# Patient Record
Sex: Male | Born: 1955 | Race: White | Hispanic: No | Marital: Married | Smoking: Former smoker
Health system: Southern US, Community
[De-identification: ages and names within clinical notes are randomized; demographics above are authoritative.]

## PROBLEM LIST (undated history)

## (undated) DIAGNOSIS — F419 Anxiety disorder, unspecified: Secondary | ICD-10-CM

## (undated) DIAGNOSIS — R519 Headache, unspecified: Secondary | ICD-10-CM

## (undated) DIAGNOSIS — I209 Angina pectoris, unspecified: Secondary | ICD-10-CM

## (undated) DIAGNOSIS — R51 Headache: Secondary | ICD-10-CM

## (undated) DIAGNOSIS — I2510 Atherosclerotic heart disease of native coronary artery without angina pectoris: Secondary | ICD-10-CM

## (undated) DIAGNOSIS — R112 Nausea with vomiting, unspecified: Secondary | ICD-10-CM

## (undated) DIAGNOSIS — E119 Type 2 diabetes mellitus without complications: Secondary | ICD-10-CM

## (undated) DIAGNOSIS — E1340 Other specified diabetes mellitus with diabetic neuropathy, unspecified: Secondary | ICD-10-CM

## (undated) DIAGNOSIS — Z9289 Personal history of other medical treatment: Secondary | ICD-10-CM

## (undated) DIAGNOSIS — E7800 Pure hypercholesterolemia, unspecified: Secondary | ICD-10-CM

## (undated) DIAGNOSIS — I10 Essential (primary) hypertension: Secondary | ICD-10-CM

## (undated) DIAGNOSIS — Z9889 Other specified postprocedural states: Secondary | ICD-10-CM

## (undated) DIAGNOSIS — M1990 Unspecified osteoarthritis, unspecified site: Secondary | ICD-10-CM

## (undated) DIAGNOSIS — Z9641 Presence of insulin pump (external) (internal): Secondary | ICD-10-CM

## (undated) HISTORY — PX: CERVICAL FUSION: SHX112

## (undated) HISTORY — PX: CARDIAC CATHETERIZATION: SHX172

## (undated) HISTORY — PX: APPENDECTOMY: SHX54

## (undated) HISTORY — DX: Personal history of other medical treatment: Z92.89

## (undated) HISTORY — DX: Atherosclerotic heart disease of native coronary artery without angina pectoris: I25.10

---

## 1986-08-05 HISTORY — PX: FRACTURE SURGERY: SHX138

## 2005-05-14 ENCOUNTER — Inpatient Hospital Stay: Payer: Self-pay | Admitting: Internal Medicine

## 2005-05-14 ENCOUNTER — Other Ambulatory Visit: Payer: Self-pay

## 2005-05-29 ENCOUNTER — Ambulatory Visit (HOSPITAL_COMMUNITY): Admission: RE | Admit: 2005-05-29 | Discharge: 2005-05-29 | Payer: Self-pay | Admitting: Neurosurgery

## 2005-06-05 ENCOUNTER — Inpatient Hospital Stay (HOSPITAL_COMMUNITY): Admission: RE | Admit: 2005-06-05 | Discharge: 2005-06-08 | Payer: Self-pay | Admitting: Neurosurgery

## 2006-09-22 ENCOUNTER — Emergency Department (HOSPITAL_COMMUNITY): Admission: EM | Admit: 2006-09-22 | Discharge: 2006-09-22 | Payer: Self-pay | Admitting: Emergency Medicine

## 2006-11-19 ENCOUNTER — Ambulatory Visit: Payer: Self-pay | Admitting: Gastroenterology

## 2007-03-12 ENCOUNTER — Ambulatory Visit: Admission: RE | Admit: 2007-03-12 | Discharge: 2007-03-12 | Payer: Self-pay | Admitting: Neurosurgery

## 2010-04-11 ENCOUNTER — Ambulatory Visit: Payer: Self-pay | Admitting: Family Medicine

## 2010-05-01 ENCOUNTER — Ambulatory Visit (HOSPITAL_COMMUNITY): Admission: RE | Admit: 2010-05-01 | Discharge: 2010-05-01 | Payer: Self-pay | Admitting: Neurosurgery

## 2010-05-04 ENCOUNTER — Inpatient Hospital Stay (HOSPITAL_COMMUNITY): Admission: RE | Admit: 2010-05-04 | Discharge: 2010-05-06 | Payer: Self-pay | Admitting: Neurosurgery

## 2010-09-24 ENCOUNTER — Emergency Department: Payer: Self-pay | Admitting: Emergency Medicine

## 2011-01-06 ENCOUNTER — Encounter: Payer: Self-pay | Admitting: Neurosurgery

## 2011-03-04 LAB — GLUCOSE, CAPILLARY
Glucose-Capillary: 127 mg/dL — ABNORMAL HIGH (ref 70–99)
Glucose-Capillary: 127 mg/dL — ABNORMAL HIGH (ref 70–99)
Glucose-Capillary: 143 mg/dL — ABNORMAL HIGH (ref 70–99)
Glucose-Capillary: 156 mg/dL — ABNORMAL HIGH (ref 70–99)
Glucose-Capillary: 79 mg/dL (ref 70–99)

## 2011-03-04 LAB — PROTIME-INR
INR: 1.03 (ref 0.00–1.49)
Prothrombin Time: 13.4 seconds (ref 11.6–15.2)

## 2011-03-04 LAB — DIFFERENTIAL
Eosinophils Relative: 2 % (ref 0–5)
Lymphocytes Relative: 26 % (ref 12–46)
Lymphs Abs: 2.4 10*3/uL (ref 0.7–4.0)

## 2011-03-04 LAB — COMPREHENSIVE METABOLIC PANEL
AST: 17 U/L (ref 0–37)
CO2: 24 mEq/L (ref 19–32)
Calcium: 9 mg/dL (ref 8.4–10.5)
Creatinine, Ser: 0.85 mg/dL (ref 0.4–1.5)
GFR calc Af Amer: >60 mL/min (ref >60)
GFR calc non Af Amer: >60 mL/min (ref >60)

## 2011-03-04 LAB — URINALYSIS, ROUTINE W REFLEX MICROSCOPIC
Leukocytes, UA: NEGATIVE
Nitrite: NEGATIVE
Protein, ur: NEGATIVE mg/dL
Urobilinogen, UA: 1 mg/dL (ref 0.0–1.0)

## 2011-03-04 LAB — CBC
MCHC: 33.9 g/dL (ref 30.0–36.0)
MCV: 94.1 fL (ref 78.0–100.0)
Platelets: 186 10*3/uL (ref 150–400)
RBC: 4.74 MIL/uL (ref 4.22–5.81)

## 2011-03-04 LAB — URINE MICROSCOPIC-ADD ON

## 2011-03-04 LAB — APTT: aPTT: 28 seconds (ref 24–37)

## 2011-03-05 LAB — COMPREHENSIVE METABOLIC PANEL
AST: 16 U/L (ref 0–37)
Albumin: 4.1 g/dL (ref 3.5–5.2)
Alkaline Phosphatase: 96 U/L (ref 39–117)
BUN: 11 mg/dL (ref 6–23)
CO2: 24 mEq/L (ref 19–32)
Chloride: 103 mEq/L (ref 96–112)
Creatinine, Ser: 0.78 mg/dL (ref 0.4–1.5)
GFR calc Af Amer: >60 mL/min (ref >60)
GFR calc non Af Amer: >60 mL/min (ref >60)
Potassium: 4.2 mEq/L (ref 3.5–5.1)
Total Bilirubin: 0.8 mg/dL (ref 0.3–1.2)

## 2011-03-05 LAB — URINALYSIS, ROUTINE W REFLEX MICROSCOPIC
Bilirubin Urine: NEGATIVE
Glucose, UA: 250 mg/dL — AB
Hgb urine dipstick: NEGATIVE
Specific Gravity, Urine: 1.006 (ref 1.005–1.030)
pH: 6.5 (ref 5.0–8.0)

## 2011-03-05 LAB — CBC
HCT: 44.8 % (ref 39.0–52.0)
MCV: 94.2 fL (ref 78.0–100.0)
RBC: 4.76 MIL/uL (ref 4.22–5.81)
WBC: 7.5 10*3/uL (ref 4.0–10.5)

## 2011-03-05 LAB — DIFFERENTIAL
Basophils Absolute: 0 10*3/uL (ref 0.0–0.1)
Basophils Relative: 1 % (ref 0–1)
Eosinophils Relative: 1 % (ref 0–5)
Lymphocytes Relative: 32 % (ref 12–46)
Monocytes Absolute: 0.6 10*3/uL (ref 0.1–1.0)

## 2011-03-05 LAB — SURGICAL PCR SCREEN: Staphylococcus aureus: NEGATIVE

## 2011-03-05 LAB — PROTIME-INR: Prothrombin Time: 12.3 seconds (ref 11.6–15.2)

## 2011-05-03 NOTE — Op Note (Signed)
NAMELAMARIUS, DIRR NO.:  [id number]   MEDICAL RECORD NO.:  [id number]          PATIENT TYPE:  INP   LOCATION:  2899                         FACILITY:  [facility]   PHYSICIAN:  Payton Doughty, M.D.      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   OPERATION PERFORMED:  Exploration of C6-7 and C7-T1 fusion and anterior  cervical decompression and fusion at C5-6 with Reflex Hybrid plate.   SURGEON:  Payton Doughty, M.D.   ANESTHESIA:  General endotracheal.   PREP:  Sterile Betadine prep and scrub with alcohol wipe.   COMPLICATIONS:  None.   ASSISTANT:  Hewitt Shorts, M.D.   INDICATIONS FOR PROCEDURE:  The patient is a 55 year old gentleman who had a  prior fusion at another institution at 6-7 and 7-1 and now has questionable  nonunion at those levels as well as ruptured disk at C5 and C6.   DESCRIPTION OF PROCEDURE:  The patient was taken to the operating room,  smoothly anesthetized, intubated, and placed supine on the operating table.  Following shave, prep and drape in the usual sterile fashion, the skin was  incised starting just at the clavicle, paralleling the medial border of the  sternocleidomastoid muscle for a distance of approximately 6 cm.  The  platysma was identified, elevated, divided and undermined.  Sternocleidomastoid was identified.  Medial dissection revealed the omohyoid  and the carotid artery.  Dissection along the superior border of the  omohyoid liberated fascial planes through which the carotid could be tracked  laterally to the left.  Trachea and esophagus could be retracted laterally  to the right.  The plate was identified and the tissue over it sharply  opened  while carefully protecting the esophagus and the tissue was elevated  off of the plate to its inferior margin and slightly up over the C5-6 disk  space.  The plate was carefully dissected free, the screws were removed and  the plate removed.  Careful inspection under direct vision of the fusion  underneath it revealed that it was very solidly in place with no evidence of  nonunion.  Attention was then turned to the C5-6 space and the 5-6 disk was  removed, first under gross observation, the operating microscope was then  brought in and we used microdissection technique to remove the remaining  disk, dissecting the posterior longitudinal ligament and exploring the  neural foramina.  On the left side there was a very large herniated disk  with compression of the left C6 root and it was removed without difficulty.  Having complete decompression, an 8 mm bone graft was fashioned from  patellar allograft and tapped into place and a 16 mm tether plate and 12 mm  screws were used, two in C5, two in C6.  An intraoperative x-ray showed good  placement of bone graft, plate and screws.  The esophagus was carefully  inspected.  No defects being found, the drain was placed in the prevertebral  space and exited via separate stab wound on the left side.  Successive  layers of 3-0 Vicryl and 4-0 Vicryl were used to close the platysma,  subcutaneous tissue and skin respectively.  Benzoin and Steri-Strips were  placed and made occlusive with Telfa and OpSite.  The patient was then  transferred to the recovery room in good condition after being placed in an  Aspen collar.       MWR/MEDQ  D:  06/05/2005  T:  06/05/2005  Job:  [id number]

## 2011-05-03 NOTE — H&P (Signed)
Martin Deleon, Martin Deleon              ACCOUNT NO.:  [id number]   MEDICAL RECORD NO.:  [id number]          PATIENT TYPE:  INP   LOCATION:  NA                           FACILITY:  [facility]   PHYSICIAN:  Payton Doughty, M.D.      DATE OF BIRTH:  1956/11/27   DATE OF ADMISSION:  06/05/2005  DATE OF DISCHARGE:                                HISTORY & PHYSICAL   7   ADMITTING DIAGNOSES:  1.  Herniated disk at C5-6.  2.  Questionable nonunion at C6-7 and C7-T1.   SERVICE:  Neurosurgery.   HISTORY OF PRESENT ILLNESS:  This is a 55 year old right-handed white  gentleman who had an anterior decompression and fusion at C6-7 and C7-T1 in  [place] in June of '05.  His arm got better and he developed some right neck  pain, but he developed pain down his left shoulder and down into his left  arm.  He had neck pain associated with it and MR demonstrated a large  herniated disk eccentric to the left side at C5-C6.  The films also show  possible nonunion at C6-7 and C7-T1 and he is admitted now for exploration  of his old fusion, diskectomy at 5-6 and fusion either at C5-6 or from C5 to  T1.   PAST MEDICAL HISTORY:  His medical history is remarkable for insulin-  dependent diabetic.   MEDICATIONS:  1.  Neurontin 800 mg four times a day.  2.  Zestril 20 mg a day.  3.  Lodine 500 mg twice a day.  4.  Klonopin 1 mg four times a day.  5.  Mevacor 40 mg a day.  6.  Promethazine 25 mg a day.  7.  Ultram 325 mg four times a day.  8.  Verapamil 240 mg once a day.  9.  Tizanidine 2 mg three times a day.  10. Viagra on a p.r.n. basis.  11. He has an insulin pump.   ALLERGIES:  He is sensitive to CODEINE.   SURGICAL HISTORY:  1.  Neck fusion in June of '05.  2.  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  3.  Right elbow fracture in '88.   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   FAMILY HISTORY:  Mom is in good health; daddy is deceased, history not   given.   REVIEW OF SYSTEMS:  Review of systems is remarkable for vertigo, chest pain,  hypertension, hypercholesterolemia, swelling in his hands from disuse,  nausea, abdominal pain, joint pain, neck pain all the time, fainting spells,  difficulty with concentration, anxiety and diabetes.   PHYSICAL EXAMINATION:  HEENT:  Exam is within normal limits.  NECK:  He has limited range of motion of his neck secondary to pain.  CHEST:  Clear.  CARDIAC:  Regular rate and rhythm.  ABDOMEN:  Abdomen is nontender with no hepatosplenomegaly.  EXTREMITIES:  Extremities without clubbing or cyanosis.  GU:  Exam is deferred.  PERIPHERAL VASCULAR:  Peripheral pulses are good.  NEUROLOGIC:  He is awake, alert and oriented.  His cranial nerves are  intact.  Motor exam shows 5/5 strength throughout the upper extremities,  save for the left deltoid, which is 5-/5 with pain limitation.  Sensory  dysesthesia is described in the left C5 distribution.  Reflexes are 2 at the  right biceps, flicker at the left, 1 at the triceps bilaterally and 1 at the  brachioradialis bilaterally.  Hoffmann's is negative.   IMAGING STUDIES:  MR demonstrates a large disk at C5-C6.   The x-rays have been reviewed above and note that there is a question of  union at 6-7 and 7-1.   CLINICAL IMPRESSION:  Left C6 radiculopathy secondary to disk.   PLAN:  The plan is for an anterior decompression at C5-6, exploration and  fusion at C6-7 and C7-T1.  If necessary, we will plate from 5 to 1, if not,  if the fusion is alright, we will simply proceed at C5-C6.  The risks and  benefits of this approach have been discussed with him and he wishes to  proceed.       MWR/MEDQ  D:  06/05/2005  T:  06/05/2005  Job:  [id number]

## 2011-05-03 NOTE — Discharge Summary (Signed)
NAMECHOYA, TORNOW              ACCOUNT NO.:  [id number]   MEDICAL RECORD NO.:  [id number]          PATIENT TYPE:  INP   LOCATION:  3038                         FACILITY:  [facility]   PHYSICIAN:  Coletta Memos, M.D.     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   PROCEDURE:  Exploration of C6-7, C7-T1 fusion, anterior cervical  decompression and fusion of C5-6 with Reflex Hybrid plate.   HOSPITAL COURSE:  The patient was admitted to the hospital on June 21 and  underwent an uncomplicated procedure.  The fusion was found to be solid at  C6-7 and at C7-T1 and therefore the plate was removed.  He had a new plate  placed at C5-6 and underwent decompression and arthrodesis.  Postoperatively, he has been doing quite well, no problems with swallowing.  He is eating well, he is walking well.  He has a wound which is clean and  dry and without signs of infection.  Again, he is doing quite well.  He will  be discharged home.       KC/MEDQ  D:  06/08/2005  T:  06/09/2005  Job:  [id number]

## 2012-02-18 ENCOUNTER — Observation Stay: Payer: Self-pay | Admitting: Internal Medicine

## 2012-02-18 LAB — URINALYSIS, COMPLETE
Bacteria: NONE SEEN
Bilirubin,UR: NEGATIVE
RBC,UR: <1 /HPF (ref 0–5)
Squamous Epithelial: <1
WBC UR: 3 /HPF (ref 0–5)

## 2012-02-18 LAB — BASIC METABOLIC PANEL
BUN: 15 mg/dL (ref 7–18)
Chloride: 99 mmol/L (ref 98–107)
Co2: 25 mmol/L (ref 21–32)
Creatinine: 0.93 mg/dL (ref 0.60–1.30)
EGFR (African American): >60
Potassium: 4.3 mmol/L (ref 3.5–5.1)
Sodium: 134 mmol/L — ABNORMAL LOW (ref 136–145)

## 2012-02-18 LAB — CK TOTAL AND CKMB (NOT AT ARMC)
CK, Total: 132 U/L (ref 35–232)
CK-MB: 0.6 ng/mL (ref 0.5–3.6)

## 2012-02-18 LAB — CBC
HCT: 44.7 % (ref 40.0–52.0)
HGB: 14.6 g/dL (ref 13.0–18.0)
MCH: 31 pg (ref 26.0–34.0)
MCHC: 32.7 g/dL (ref 32.0–36.0)
RDW: 12.5 % (ref 11.5–14.5)

## 2012-02-18 LAB — TROPONIN I: Troponin-I: <0.02 ng/mL

## 2012-02-18 LAB — MAGNESIUM: Magnesium: 1.7 mg/dL — ABNORMAL LOW

## 2012-02-19 LAB — HEMOGLOBIN A1C: Hemoglobin A1C: 9.7 % — ABNORMAL HIGH (ref 4.2–6.3)

## 2012-02-19 LAB — BASIC METABOLIC PANEL
Anion Gap: 13 (ref 7–16)
BUN: 16 mg/dL (ref 7–18)
Calcium, Total: 8.3 mg/dL — ABNORMAL LOW (ref 8.5–10.1)
Chloride: 104 mmol/L (ref 98–107)
Creatinine: 0.91 mg/dL (ref 0.60–1.30)
Osmolality: 287 (ref 275–301)
Sodium: 145 mmol/L (ref 136–145)

## 2012-02-19 LAB — CBC WITH DIFFERENTIAL/PLATELET
Basophil %: 0.6 %
Eosinophil #: 0.2 10*3/uL (ref 0.0–0.7)
Eosinophil %: 1.8 %
HGB: 13.9 g/dL (ref 13.0–18.0)
Lymphocyte %: 43.4 %
Monocyte %: 10.4 %
Neutrophil %: 43.8 %
RBC: 4.42 10*6/uL (ref 4.40–5.90)
WBC: 9 10*3/uL (ref 3.8–10.6)

## 2012-02-19 LAB — LIPID PANEL
HDL Cholesterol: 31 mg/dL — ABNORMAL LOW (ref 40–60)
Triglycerides: 82 mg/dL (ref 0–200)
VLDL Cholesterol, Calc: 16 mg/dL (ref 5–40)

## 2012-02-19 LAB — CK TOTAL AND CKMB (NOT AT ARMC): CK, Total: 90 U/L (ref 35–232)

## 2013-12-06 ENCOUNTER — Ambulatory Visit: Payer: Self-pay | Admitting: Podiatry

## 2014-08-04 ENCOUNTER — Observation Stay: Payer: Self-pay | Admitting: Internal Medicine

## 2014-08-04 LAB — COMPREHENSIVE METABOLIC PANEL
ALK PHOS: 94 U/L
ALT: 21 U/L
ANION GAP: 8 (ref 7–16)
AST: 27 U/L (ref 15–37)
Albumin: 3.7 g/dL (ref 3.4–5.0)
BILIRUBIN TOTAL: 0.2 mg/dL (ref 0.2–1.0)
BUN: 14 mg/dL (ref 7–18)
CO2: 26 mmol/L (ref 21–32)
Calcium, Total: 8.8 mg/dL (ref 8.5–10.1)
Chloride: 102 mmol/L (ref 98–107)
Creatinine: 0.79 mg/dL (ref 0.60–1.30)
EGFR (African American): >60
Glucose: 266 mg/dL — ABNORMAL HIGH (ref 65–99)
Osmolality: 282 (ref 275–301)
Potassium: 3.8 mmol/L (ref 3.5–5.1)
SODIUM: 136 mmol/L (ref 136–145)
Total Protein: 6.6 g/dL (ref 6.4–8.2)

## 2014-08-04 LAB — CBC
HCT: 41.9 % (ref 40.0–52.0)
HGB: 13.5 g/dL (ref 13.0–18.0)
MCH: 30.6 pg (ref 26.0–34.0)
MCHC: 32.3 g/dL (ref 32.0–36.0)
MCV: 95 fL (ref 80–100)
PLATELETS: 216 10*3/uL (ref 150–440)
RBC: 4.42 10*6/uL (ref 4.40–5.90)
RDW: 13 % (ref 11.5–14.5)
WBC: 8.4 10*3/uL (ref 3.8–10.6)

## 2014-08-04 LAB — TROPONIN I: Troponin-I: <0.02 ng/mL

## 2014-08-04 LAB — PROTIME-INR
INR: 1
Prothrombin Time: 12.7 secs (ref 11.5–14.7)

## 2014-08-04 LAB — APTT: ACTIVATED PTT: 27.5 s (ref 23.6–35.9)

## 2014-08-04 LAB — CK TOTAL AND CKMB (NOT AT ARMC)
CK, Total: 165 U/L
CK-MB: 1.6 ng/mL (ref 0.5–3.6)

## 2014-08-05 LAB — LIPID PANEL
Cholesterol: 114 mg/dL (ref 0–200)
HDL: 38 mg/dL — AB (ref 40–60)
Ldl Cholesterol, Calc: 63 mg/dL (ref 0–100)
Triglycerides: 64 mg/dL (ref 0–200)
VLDL CHOLESTEROL, CALC: 13 mg/dL (ref 5–40)

## 2014-08-05 LAB — SEDIMENTATION RATE: Erythrocyte Sed Rate: 3 mm/hr (ref 0–20)

## 2014-08-05 LAB — TROPONIN I

## 2014-08-08 LAB — PSA: PSA: 0.6 ng/mL (ref 0.0–4.0)

## 2014-08-08 LAB — CANCER ANTIGEN 19-9: CA 19-9: 14 U/mL (ref 0–35)

## 2014-08-08 LAB — CEA: CEA: 2.3 ng/mL (ref 0.0–4.7)

## 2014-08-23 ENCOUNTER — Emergency Department: Payer: Self-pay | Admitting: Emergency Medicine

## 2015-04-08 NOTE — H&P (Signed)
PATIENT NAME:  Martin Deleon, Martin Deleon MR#:  [id number] DATE OF BIRTH:  1956-05-13  DATE OF ADMISSION:  08/04/2014  PRIMARY CARE PHYSICIAN: Dr. Calla Kicks.    CHIEF COMPLAINT: Chest pain.   HISTORY OF PRESENT ILLNESS: This is a 59 year old male who presents to the hospital complaining of substernal chest pain, progressively getting worse over the past 2 days. He describes the pain as being sharp in nature, located in the center of his chest, radiating to his neck, and also some tingling in his left upper extremity. He associates it with some mild shortness of breath, some nausea, some diaphoresis, and some dizziness, but no loss of consciousness. Since patient's symptoms are progressively getting worse, he came to the ER for further evaluation. His symptoms are typical for suspected angina; therefore, hospitalist services were contacted for further treatment and evaluation.   REVIEW OF SYSTEMS:  CONSTITUTIONAL: No documented fever. No weight gain. Positive weight loss, about 15 pounds over the past couple of weeks.  EYES: No blurred or double vision.  ENT: No tinnitus. No postnasal drip. No redness of the oropharynx.  RESPIRATORY: No cough, no wheeze, no hemoptysis. Positive dyspnea.  CARDIOVASCULAR: Positive chest pain. No orthopnea.  No palpitation or syncope.  GASTROINTESTINAL: Positive nausea. No vomiting. No diarrhea. No abdominal pain. No melena or hematochezia.  GENITOURINARY: No dysuria and hematuria.  ENDOCRINE: No polyuria or nocturia. No heat or cold intolerance.  HEMATOLOGIC: No anemia, no bruising, no bleeding.  INTEGUMENTARY: No rashes. No lesions.  MUSCULOSKELETAL: No arthritis. No swelling. No gout.  NEUROLOGIC: No numbness or tingling. No ataxia. No seizure-type activity.  PSYCHIATRIC: No anxiety. No insomnia. No ADD.   PAST MEDICAL HISTORY: Consistent with diabetes, hypertension, history of chronic pain due to the neck surgery, hyperlipidemia, anxiety, and diabetic neuropathy.    ALLERGIES: CODEINE.   SOCIAL HISTORY: Used to be a smoker. Does have an 18 pack-year smoking history, quit in 1998. No alcohol abuse.  Did smoke marijuana 2 days ago.   FAMILY HISTORY: Mother is alive and healthy. Father died from complications of penile cancer.   CURRENT MEDICATIONS: Aspirin 81 mg daily, Klonopin 1 mg b.i.d., gabapentin 800 mg t.i.d., lisinopril 20 mg daily, naratriptan 2.5 mg daily as needed for migraine, multivitamin daily, promethazine 25 mg q. 6 hours as needed, simvastatin 40 mg daily, and tizanidine 4 mg t.i.d.   PHYSICAL EXAMINATION: Presently is as follows:  VITAL SIGNS:  Temperature 98.2, pulse 73, respirations 18, blood pressure 139/105, saturations 99% on room air.  GENERAL: He is a pleasant-appearing male, but in no apparent distress.  HEENT:  Atraumatic, normocephalic. Extraocular muscles are intact. Pupils equal and reactive to light. Sclerae anicteric. No conjunctival injection. No pharyngeal erythema.  NECK: Supple. No jugular venous distention. No bruits, lymphadenopathy, or thyromegaly.  HEART: Regular rate and rhythm. No murmurs. No rubs. No clicks.  LUNGS: Clear to auscultation bilaterally. No rales or rhonchi. No wheezes.  ABDOMEN: Soft, flat, nontender, nondistended. Has good bowel sounds. No hepatosplenomegaly appreciated.  EXTREMITIES: No evidence of any cyanosis, clubbing, or peripheral edema. Has +2 pedal and radial pulses bilaterally.  NEUROLOGICAL: The patient is alert, awake, and oriented x 3 with no focal motor or sensory deficits appreciated bilaterally.  SKIN: Moist and warm with no rashes appreciated.  BACK:  There is no cervical or axillary lymphadenopathy.   LABORATORY DATA: Serum glucose of 266, BUN 14, creatinine 0.7, sodium 136, potassium 3.8, chloride 102, bicarbonate 26. LFTs are within normal limits. Troponin less than 0.02.  White cell count 8.4, hemoglobin 13.5, hematocrit 41.9, platelet count 216,000. INR is 1.0. The patient did  have an EKG done which showed normal sinus rhythm with normal axis and no evidence of any acute ST or T wave changes. Chest x-ray shows no evidence of any acute cardiopulmonary disease.   ASSESSMENT AND PLAN: This 59 year old male with a history of diabetes, hypertension, history of chronic pain to the neck surgery, hyperlipidemia, anxiety, and diabetic neuropathy who presents to the hospital with chest pain.   1.  Chest pain. The patient's symptoms are very typical for angina. He does have significant risk factors given a history of diabetes and tobacco abuse. For now, I will observe him overnight on telemetry, follow serial cardiac markers. His first set is negative. His EKG is essentially showing no ST changes. I will continue with aspirin, statin, nitroglycerin, morphine, and oxygen. Will get a Myoview in the morning. Get a cardiology consult. The patient has seen Dr. Clayborn Bigness in the past.  2.  Diabetes. I will continue his own insulin pump.  3.  Diabetic neuropathy: Continue Neurontin.  4.  Hypertension. Continue lisinopril.  5.  Anxiety. Continue Klonopin.  6.  Hyperlipidemia. Continue simvastatin.   CODE STATUS: The patient is a FULL CODE.   TIME SPENT ON ADMISSION:  45 minutes.    ____________________________ Belia Heman. Verdell Carmine, MD vjs:TT D: 08/04/2014 17:54:50 ET T: 08/04/2014 18:17:53 ET JOB#: [id number]  cc: Belia Heman. Verdell Carmine, MD, <Dictator> Henreitta Leber MD ELECTRONICALLY SIGNED 08/13/2014 11:48

## 2015-04-08 NOTE — Consult Note (Signed)
PATIENT NAME:  Martin Deleon, KLUENDER MR#:  [id number] DATE OF BIRTH:  [birth year]  DATE OF CONSULTATION:  08/05/2014  REFERRING PHYSICIAN:   CONSULTING PHYSICIAN:  Isaias Cowman, MD  PRIMARY CARE PHYSICIAN: Calla Kicks, MD  CHIEF COMPLAINT: Chest pain.   HISTORY OF PRESENT ILLNESS: The patient is a 59 year old gentleman with known history of insulin-requiring diabetes, referred for evaluation of chest pain. The patient has had recurrent substernal chest discomfort described as sharp in nature, radiating to his neck with tingling in his left arm associated with shortness of breath, nausea, diaphoresis and dizziness. The patient reports his symptoms have been progressive over the past 1 to 2 days and presented to [facility] Emergency Room. EKG was nondiagnostic for ischemia. The patient has ruled out for myocardial infarction by CPK, isoenzymes and troponin.   PAST MEDICAL HISTORY: 1.  Insulin-requiring diabetes.  2.  Hyperlipidemia.  3.  Diabetic neuropathy.  4.  Hypertension.  5.  Anxiety.   MEDICATIONS: Aspirin 81 mg daily, lisinopril 20 mg daily, simvastatin 40 mg daily, Klonopin 1 mg b.i.d., gabapentin 800 mg t.i.d., promethazine 25 mg q. 6 p.r.n., tizanidine 4 mg t.i.d., insulin pump.   SOCIAL HISTORY: The patient is married and has 2 children. He has 18 pack-year tobacco abuse, quit in 1998.   FAMILY HISTORY: Father is status post myocardial infarction in his 60s.   REVIEW OF SYSTEMS: CONSTITUTIONAL: No fever or chills.  EYES: No blurry vision.  EARS: No hearing loss.  RESPIRATORY: No shortness of breath.  CARDIOVASCULAR: Chest pain as described above.  GASTROINTESTINAL: The patient has had some mild nausea without vomiting.  GASTROINTESTINAL: No dysuria or hematuria.  ENDOCRINE: No polyuria or polydipsia.  MUSCULOSKELETAL: No arthralgias or myalgias.  NEUROLOGICAL: No focal muscle weakness or numbness.  PSYCHOLOGICAL: No depression. The patient does have anxiety.   PHYSICAL  EXAMINATION: VITAL SIGNS: Blood pressure 114/74, pulse 55, respirations 18, temperature 97.3, pulse ox 99%.  HEENT: Pupils equal and reactive to light and accommodation.  NECK: Supple without thyromegaly.  LUNGS: Clear.  HEART: Normal JVP. Normal PMI. Regular rate and rhythm. Normal S1, S2. No appreciable gallop, murmur, or rub.  ABDOMEN: Soft and nontender. Pulses were intact bilaterally.  MUSCULOSKELETAL: Normal muscle tone.  NEUROLOGIC: The patient is alert and oriented x3. Motor and sensory are both grossly intact.   IMPRESSION: A 59 year old gentleman with multiple cardiovascular risk factors with progressive chest pain with typical and atypical features with nondiagnostic EKG.   RECOMMENDATIONS: 1.  Agree with overall current therapy.  2.  Would defer full dose anticoagulation.  3.  Proceed with Lexiscan sestamibi study this a.m.  4.  Further recommendations pending Lexiscan sestamibi results.  ____________________________ Isaias Cowman, MD ap:sb D: 08/05/2014 09:06:27 ET T: 08/05/2014 10:46:02 ET JOB#: [id number]  cc: Isaias Cowman, MD, <Dictator> Isaias Cowman MD ELECTRONICALLY SIGNED 08/18/2014 16:00

## 2015-04-08 NOTE — Discharge Summary (Signed)
PATIENT NAME:  Martin Deleon, Martin Deleon MR#:  [id number] DATE OF BIRTH:  1956-02-19  DATE OF ADMISSION:  08/04/2014 DATE OF DISCHARGE:  08/08/2014  DISCHARGE DIAGNOSES:  1.  Chest pain, unlikely cardiac, with cardiac catheterization showing insignificant coronary disease.  2.  Unintentional weight loss requiring outpatient work-up.  3.  Diabetes mellitus.  4.  Hypertension.   SECONDARY DIAGNOSES:  1.  Chronic pain.  2.  Hyperlipidemia.  3.  Anxiety.  4.  Diabetic neuropathy.   CONSULTATIONS:  Cardiology, Dr. Saralyn Pilar.   PROCEDURES AND RADIOLOGY: Chest x-ray on August 20th showed no acute cardiopulmonary disease.   Myoview on the August 21st showed mild ischemia in the septal territory with EF of 65%.   Cardiac catheterization on the 24th of August showed tubular 30% stenosis of the mid LAD, 25% stenosis of ostial RCA, EF of 63%. A right dominant coronary circulation. Overall significant coronary disease.   MAJOR LABORATORY PANEL: CEA was within normal limits. PSA was within normal limits. CA-19-9 was within normal limits.   HISTORY AND SHORT HOSPITAL COURSE: The patient is a 59 year old male with the above-mentioned medical problems who was admitted for chest pain. Please see Dr. Edward Jolly dictated history and physical for further details. The patient was ruled out with serial troponins. Underwent Myoview which was borderline positive, for which a cardiology consultation was obtained with Dr. Saralyn Pilar, considering the patient's typical anginal symptoms and ongoing symptoms in spite of borderline stress test. Cardiac catheterization was recommended, which was performed on the August 24th showed insignificant coronary artery disease. After discussion with the patient he was agreeable with discharge as there was no obvious coronary disease seen.  On the date of discharge, his vital signs were as follows: Temperature 97.9, heart rate 91 per minute, respirations 18 per minute, blood pressure  123/78.  The patient was saturating 93% on room air.   PERTINENT DISCHARGE PHYSICAL EXAMINATION:  CARDIOVASCULAR: S1, S2 normal. No murmurs, rubs, or gallop.  LUNGS: Clear to auscultation bilaterally. No wheezing, rales, rhonchi, crepitation.  ABDOMEN: Soft, benign.  NEUROLOGIC: Nonfocal examination.   The remainder of the Physical examination remained at baseline. Please note, the patient was not very happy knowing negative cardiac catheterization report as he was expecting some definite disease in the coronaries that could be fixed, but he does not have one.   DISCHARGE MEDICATIONS:  gabapentin 800 mg oral tablet  1 tab(s) orally 3 times a day   lisinopril 20 mg oral tablet  1 tab(s) orally once a day   tizanidine 4 mg oral tablet  1 tab(s) orally 3 times a day   aspirin 81 mg oral tablet  1 tab(s) orally once a day   one-a-day 50+ oral tablet  1 tab(s) orally once a day   clonazepam 1 mg oral tablet  1 tab(s) orally 2 times a day   naratriptan 2.5 mg oral tablet  1 tab(s) orally once a day, As Needed - for Migraine   promethazine 25 mg oral tablet  1 tab(s) orally every 6 hours, As Needed - for Nausea, Vomiting   simvastatin 40 mg oral tablet  1 tab(s) orally once a day (in the morning)   acetaminophen 325 mg oral tablet  2 tab(s) orally every 4 hours, As needed, mild pain (1-3/10) or temp. greater than 100.4     DISCHARGE DIET: Low sodium, low fat, low cholesterol, 1800 ADA.   DISCHARGE ACTIVITY: As tolerated.   DISCHARGE INSTRUCTIONS AND FOLLOWUP: The patient was instructed to follow  up with his primary care physician at [facility], Dr. Gayland Curry, in 1-2 weeks. He will need followup with Dr. Isaias Deleon in 4-6 weeks, with his pain management doctor either at [facility] or in [place] in 2-4 weeks.   Total time discharging this patient: 55 minutes.    ____________________________ Lucina Mellow. Manuella Ghazi, MD vss:lt D: 08/08/2014 19:19:58 ET T: 08/09/2014 07:43:52  ET JOB#: [id number]  cc: Kittie Krizan S. Manuella Ghazi, MD, <Dictator> Martin Cowman, MD Floria Raveling. Astrid Divine, MD [place] MD ELECTRONICALLY SIGNED 08/11/2014 13:23
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
## 2015-04-09 NOTE — Discharge Summary (Signed)
PATIENT NAME:  Martin Deleon, Martin Deleon MR#:  [id number] DATE OF BIRTH:  09-03-1956  DATE OF ADMISSION:  02/18/2012 DATE OF DISCHARGE:  02/19/2012  PRIMARY CARE PHYSICIAN: Dr. Calla Kicks  ENDOCRINOLOGIST: [facility] Dr. Selinda Flavin   DISCHARGE DIAGNOSES:  1. Chest pain, likely noncardiac, possible cervical radiculopathy versus muscular. Had negative serial cardiac enzymes and negative Myoview.  2. Diabetes mellitus with labile blood sugar causing transient symptomatic hypoglycemia and hyperglycemia symptoms also while inpatient, now resolved and feeling much better.   SECONDARY DIAGNOSES:  1. Hypertension.  2. Diabetes mellitus.  3. Hyperlipidemia.  4. Former tobacco abuse.   CONSULTATIONS: None.   LABORATORY, DIAGNOSTIC AND RADIOLOGICAL DATA: CT scan of the head without contrast on 03/05 showed no acute intracranial abnormalities.   Chest x-ray on 03/05 showed no acute cardiopulmonary disease.   Myoview on 03/06 was negative.   Urinalysis on admission was negative.   HISTORY AND SHORT HOSPITAL COURSE: Patient is a 59 year old male with above-mentioned medical problems was admitted for chest pain, was ruled out with three negative sets of cardiac enzymes. His TSH was within normal limits. He underwent Myoview which was negative for any ischemia. He did have significant difficulty with his blood sugar while in the hospital with blood sugar dropping in low 30s and going up as high as up to 372. Potential contributors to fluctuating blood sugar could be him being n.p.o. for his stress test and subsequently he was given D50 and orange juice which might have contributed sugar going up. Next, he was feeling much better, did not have any subsequent chest pain and was discharged home in stable condition on 03/06 on the date of discharge.   PHYSICAL EXAMINATION: VITAL SIGNS: On the date of discharge his vital signs were as follows: Temperature 97.7, heart rate 80 per minute, respirations 18 per minute,  blood pressure 117/80 mmHg. He was saturating 97% on room air. Pertinent Physical Examination: CARDIOVASCULAR: S1, S2 normal. No murmurs, rubs, or gallop. LUNGS: Clear to auscultation bilaterally. No wheezing, rales, rhonchi, crepitation. ABDOMEN: Soft, benign. NEUROLOGIC: Nonfocal examination. All other physical examination remained at the baseline.   DISCHARGE MEDICATIONS: 1. Clonazepam 1 mg p.o. 3 times a day as needed.  2. Gabapentin 800 mg p.o. t.i.d.   3. Acetaminophen/hydrocodone 500/7.5 mg 1 tablet p.o. t.i.d.  4. Lisinopril 20 mg p.o. daily.  5. Tizanidine 4 mg p.o. t.i.d.  6. Simvastatin 40 mg p.o. daily.  7. Promethazine 25 mg p.o. daily as needed.  8. Nortriptyline 2.5 mg p.o. daily as needed.  9. Aspirin 81 mg p.o. daily.  10. One a day vitamin once a day.  11. Insulin pump as per his home per [facility] endocrinology recommendation.   DISCHARGE DIET: Low sodium, 1800 ADA, low cholesterol.   DISCHARGE ACTIVITY: As tolerated.   DISCHARGE INSTRUCTIONS AND FOLLOW UP: Patient was instructed to follow up with his primary care physician, Dr. Calla Kicks, in 1 to 2 weeks. He will need follow up with Dr. Selinda Flavin from [facility] endocrinology as scheduled.   ____________________________ Lucina Mellow. Manuella Ghazi, MD vss:cms D: 02/21/2012 14:56:12 ET T: 02/22/2012 12:13:20 ET  JOB#: [id number] cc: Luwana Butrick S. Manuella Ghazi, MD, <Dictator> Dory Horn. Eliberto Ivory, MD Dr. Selinda Flavin, [facility], FAX # [phone] Lucina Mellow [facility] MD ELECTRONICALLY SIGNED 02/22/2012 13:15

## 2015-04-27 ENCOUNTER — Other Ambulatory Visit: Payer: Self-pay | Admitting: Specialist

## 2015-04-27 DIAGNOSIS — M25551 Pain in right hip: Secondary | ICD-10-CM

## 2015-05-10 ENCOUNTER — Ambulatory Visit: Payer: Managed Care, Other (non HMO)

## 2015-05-10 ENCOUNTER — Other Ambulatory Visit: Payer: Self-pay | Admitting: Specialist

## 2015-05-10 ENCOUNTER — Ambulatory Visit
Admission: RE | Admit: 2015-05-10 | Discharge: 2015-05-10 | Disposition: A | Discharge status: Home / Self Care | Payer: Managed Care, Other (non HMO) | Source: Ambulatory Visit | Attending: Specialist | Admitting: Specialist

## 2015-05-10 DIAGNOSIS — M1612 Unilateral primary osteoarthritis, left hip: Secondary | ICD-10-CM | POA: Diagnosis present

## 2015-05-10 DIAGNOSIS — M25552 Pain in left hip: Secondary | ICD-10-CM

## 2015-05-10 DIAGNOSIS — M25551 Pain in right hip: Secondary | ICD-10-CM

## 2015-05-10 MED ORDER — ROPIVACAINE HCL 5 MG/ML IJ SOLN
7.0000 mL | Freq: Once | INTRAMUSCULAR | Status: AC
  Administered 2015-05-10: 7 mL via EPIDURAL
  Filled 2015-05-10: qty 7

## 2015-05-10 MED ORDER — IOHEXOL 180 MG/ML  SOLN
0.5000 mL | Freq: Once | INTRAMUSCULAR | Status: AC | PRN
  Administered 2015-05-10: 0.5 mL

## 2015-05-10 MED ORDER — TRIAMCINOLONE ACETONIDE 40 MG/ML IJ SUSP (RADIOLOGY)
40.0000 mg | Freq: Once | INTRAMUSCULAR | Status: AC
  Administered 2015-05-10: 40 mg

## 2015-06-28 ENCOUNTER — Other Ambulatory Visit: Payer: Self-pay | Admitting: Specialist

## 2015-06-28 DIAGNOSIS — M25552 Pain in left hip: Secondary | ICD-10-CM

## 2015-08-11 ENCOUNTER — Ambulatory Visit
Admission: RE | Admit: 2015-08-11 | Discharge: 2015-08-11 | Disposition: A | Discharge status: Home / Self Care | Payer: Managed Care, Other (non HMO) | Source: Ambulatory Visit | Attending: Specialist | Admitting: Specialist

## 2015-08-11 DIAGNOSIS — M25552 Pain in left hip: Secondary | ICD-10-CM | POA: Insufficient documentation

## 2015-08-11 MED ORDER — ROPIVACAINE HCL 5 MG/ML IJ SOLN
7.0000 mL | Freq: Once | INTRAMUSCULAR | Status: AC
  Administered 2015-08-11: 7 mL
  Filled 2015-08-11: qty 7

## 2015-08-11 MED ORDER — IOHEXOL 300 MG/ML  SOLN: 10.0000 mL | Freq: Once | INTRAMUSCULAR | Status: DC | PRN

## 2015-08-11 MED ORDER — TRIAMCINOLONE ACETONIDE 40 MG/ML IJ SUSP
40.0000 mg | Freq: Once | INTRAMUSCULAR | Status: AC
  Administered 2015-08-11: 40 mg via INTRA_ARTICULAR
  Filled 2015-08-11: qty 1

## 2015-10-18 ENCOUNTER — Encounter
Admission: RE | Admit: 2015-10-18 | Discharge: 2015-10-18 | Disposition: A | Discharge status: Home / Self Care | Payer: Managed Care, Other (non HMO) | Source: Ambulatory Visit | Attending: Specialist | Admitting: Specialist

## 2015-10-18 DIAGNOSIS — Z01818 Encounter for other preprocedural examination: Secondary | ICD-10-CM | POA: Insufficient documentation

## 2015-10-18 HISTORY — DX: Type 2 diabetes mellitus without complications: E11.9

## 2015-10-18 HISTORY — DX: Presence of insulin pump (external) (internal): Z96.41

## 2015-10-18 HISTORY — DX: Other specified diabetes mellitus with diabetic neuropathy, unspecified: E13.40

## 2015-10-18 HISTORY — DX: Other specified postprocedural states: Z98.890

## 2015-10-18 HISTORY — DX: Nausea with vomiting, unspecified: R11.2

## 2015-10-18 HISTORY — DX: Other specified postprocedural states: R11.2

## 2015-10-18 HISTORY — DX: Headache, unspecified: R51.9

## 2015-10-18 HISTORY — DX: Unspecified osteoarthritis, unspecified site: M19.90

## 2015-10-18 HISTORY — DX: Anxiety disorder, unspecified: F41.9

## 2015-10-18 HISTORY — DX: Headache: R51

## 2015-10-18 LAB — APTT: aPTT: 28 seconds (ref 24–36)

## 2015-10-18 LAB — URINALYSIS COMPLETE WITH MICROSCOPIC (ARMC ONLY)
BILIRUBIN URINE: NEGATIVE
Bacteria, UA: NONE SEEN
GLUCOSE, UA: 50 mg/dL — AB
Hgb urine dipstick: NEGATIVE
KETONES UR: NEGATIVE mg/dL
Leukocytes, UA: NEGATIVE
Nitrite: NEGATIVE
Protein, ur: NEGATIVE mg/dL
RBC / HPF: NONE SEEN RBC/hpf (ref 0–5)
SPECIFIC GRAVITY, URINE: 1.004 — AB (ref 1.005–1.030)
Squamous Epithelial / LPF: NONE SEEN
WBC, UA: NONE SEEN WBC/hpf (ref 0–5)
pH: 7 (ref 5.0–8.0)

## 2015-10-18 LAB — CBC
HCT: 43.9 % (ref 40.0–52.0)
HEMOGLOBIN: 14.7 g/dL (ref 13.0–18.0)
MCH: 31.2 pg (ref 26.0–34.0)
MCHC: 33.5 g/dL (ref 32.0–36.0)
MCV: 93 fL (ref 80.0–100.0)
Platelets: 225 10*3/uL (ref 150–440)
RBC: 4.72 MIL/uL (ref 4.40–5.90)
RDW: 12.7 % (ref 11.5–14.5)
WBC: 7 10*3/uL (ref 3.8–10.6)

## 2015-10-18 LAB — TYPE AND SCREEN
ABO/RH(D): O POS
Antibody Screen: NEGATIVE

## 2015-10-18 LAB — BASIC METABOLIC PANEL
Anion gap: 9 (ref 5–15)
BUN: 10 mg/dL (ref 6–20)
CHLORIDE: 103 mmol/L (ref 101–111)
CO2: 26 mmol/L (ref 22–32)
Calcium: 9.1 mg/dL (ref 8.9–10.3)
Creatinine, Ser: 0.79 mg/dL (ref 0.61–1.24)
Glucose, Bld: 159 mg/dL — ABNORMAL HIGH (ref 65–99)
POTASSIUM: 3.8 mmol/L (ref 3.5–5.1)
SODIUM: 138 mmol/L (ref 135–145)

## 2015-10-18 LAB — PROTIME-INR
INR: 0.93
Prothrombin Time: 12.7 seconds (ref 11.4–15.0)

## 2015-10-18 LAB — SURGICAL PCR SCREEN
MRSA, PCR: NEGATIVE
Staphylococcus aureus: NEGATIVE

## 2015-10-18 LAB — ABO/RH: ABO/RH(D): O POS

## 2015-10-18 NOTE — Patient Instructions (Signed)
  Your procedure is scheduled on: October 31, 2015(Tuesday) Report to Day Surgery.[facility]) To find out your arrival time please call [phone] between 1PM - 3PM on October 30, 2015(Monday).  Remember: Instructions that are not followed completely may result in serious medical risk, up to and including death, or upon the discretion of your surgeon and anesthesiologist your surgery may need to be rescheduled.    __x__ 1. Do not eat food or drink liquids after midnight. No gum chewing or hard candies.     ____ 2. No Alcohol for 24 hours before or after surgery.   ____ 3. Bring all medications with you on the day of surgery if instructed.    __x__ 4. Notify your doctor if there is any change in your medical condition     (cold, fever, infections).     Do not wear jewelry, make-up, hairpins, clips or nail polish.  Do not wear lotions, powders, or perfumes. You may wear deodorant.  Do not shave 48 hours prior to surgery. Men may shave face and neck.  Do not bring valuables to the hospital.    [facility] is not responsible for any belongings or valuables.               Contacts, dentures or bridgework may not be worn into surgery.  Leave your suitcase in the car. After surgery it may be brought to your room.  For patients admitted to the hospital, discharge time is determined by your                treatment team.   Patients discharged the day of surgery will not be allowed to drive home.   Please read over the following fact sheets that you were given:   MRSA Information and Surgical Site Infection Prevention   __x__ Take these medicines the morning of surgery with A SIP OF WATER:    1. Lisinopril  2. Gabapentin  3.   4.  5.  6.  ____ Fleet Enema (as directed)   __x__ Use CHG Soap as directed  ____ Use inhalers on the day of surgery  ____ Stop metformin 2 days prior to surgery    __x__ Take 1/2 of usual insulin dose the night before surgery and none on the  morning of surgery. (LEAVE INSULIN PUMP AS USUAL)  __x__ Stop Coumadin/Plavix/aspirin on (Stop Aspirin one week prior to surgery)  x__ Stop Anti-inflammatories on (Stop Aleve one week prior to surgery)   ____ Stop supplements until after surgery.    ____ Bring C-Pap to the hospital.

## 2015-10-25 NOTE — OR Nursing (Signed)
Info from diabetologist on chart regarding insulin pump. Spoke with Otis Peak at surgeon office re medical clearance suggested by diabetologist.

## 2015-10-31 ENCOUNTER — Inpatient Hospital Stay: Payer: Managed Care, Other (non HMO)

## 2015-10-31 ENCOUNTER — Inpatient Hospital Stay
Admission: RE | Admit: 2015-10-31 | Discharge: 2015-11-03 | DRG: 470 | Disposition: A | Discharge status: Home Health | Payer: Managed Care, Other (non HMO) | Source: Ambulatory Visit | Attending: Specialist | Admitting: Specialist

## 2015-10-31 ENCOUNTER — Inpatient Hospital Stay: Payer: Managed Care, Other (non HMO) | Admitting: Anesthesiology

## 2015-10-31 ENCOUNTER — Ambulatory Visit: Payer: Self-pay | Admitting: Specialist

## 2015-10-31 ENCOUNTER — Encounter
Admission: RE | Disposition: A | Discharge status: Home Health | Payer: Self-pay | Source: Ambulatory Visit | Attending: Specialist

## 2015-10-31 ENCOUNTER — Encounter: Payer: Self-pay | Admitting: *Deleted

## 2015-10-31 DIAGNOSIS — Z87891 Personal history of nicotine dependence: Secondary | ICD-10-CM

## 2015-10-31 DIAGNOSIS — Z23 Encounter for immunization: Secondary | ICD-10-CM | POA: Diagnosis not present

## 2015-10-31 DIAGNOSIS — E109 Type 1 diabetes mellitus without complications: Secondary | ICD-10-CM | POA: Diagnosis present

## 2015-10-31 DIAGNOSIS — M1612 Unilateral primary osteoarthritis, left hip: Principal | ICD-10-CM | POA: Diagnosis present

## 2015-10-31 DIAGNOSIS — Z96649 Presence of unspecified artificial hip joint: Secondary | ICD-10-CM

## 2015-10-31 HISTORY — PX: TOTAL HIP ARTHROPLASTY: SHX124

## 2015-10-31 LAB — CBC
HEMATOCRIT: 39.3 % — AB (ref 40.0–52.0)
HEMOGLOBIN: 12.8 g/dL — AB (ref 13.0–18.0)
MCH: 30.3 pg (ref 26.0–34.0)
MCHC: 32.5 g/dL (ref 32.0–36.0)
MCV: 93.1 fL (ref 80.0–100.0)
Platelets: 220 10*3/uL (ref 150–440)
RBC: 4.22 MIL/uL — ABNORMAL LOW (ref 4.40–5.90)
RDW: 12.8 % (ref 11.5–14.5)
WBC: 18.3 10*3/uL — AB (ref 3.8–10.6)

## 2015-10-31 LAB — GLUCOSE, CAPILLARY
GLUCOSE-CAPILLARY: 135 mg/dL — AB (ref 65–99)
GLUCOSE-CAPILLARY: 280 mg/dL — AB (ref 65–99)
GLUCOSE-CAPILLARY: 297 mg/dL — AB (ref 65–99)
GLUCOSE-CAPILLARY: 224 mg/dL — AB (ref 65–99)
Glucose-Capillary: 285 mg/dL — ABNORMAL HIGH (ref 65–99)
Glucose-Capillary: 250 mg/dL — ABNORMAL HIGH (ref 65–99)

## 2015-10-31 LAB — CREATININE, SERUM: CREATININE: 0.72 mg/dL (ref 0.61–1.24)

## 2015-10-31 SURGERY — ARTHROPLASTY, HIP, TOTAL,POSTERIOR APPROACH
Anesthesia: Spinal | Site: Hip | Laterality: Left | Wound class: Clean

## 2015-10-31 MED ORDER — MAGNESIUM HYDROXIDE 400 MG/5ML PO SUSP
30.0000 mL | Freq: Every day | ORAL | Status: DC | PRN
  Administered 2015-11-01 – 2015-11-02 (×2): 30 mL via ORAL
  Filled 2015-10-31 (×2): qty 30

## 2015-10-31 MED ORDER — CEFAZOLIN SODIUM-DEXTROSE 2-3 GM-% IV SOLR
INTRAVENOUS | Status: AC
  Filled 2015-10-31: qty 50

## 2015-10-31 MED ORDER — KETAMINE HCL 50 MG/ML IJ SOLN
INTRAMUSCULAR | Status: DC | PRN
  Administered 2015-10-31 (×2): 10 mg via INTRAMUSCULAR
  Administered 2015-10-31: 5 mg via INTRAMUSCULAR
  Administered 2015-10-31: 25 mg via INTRAMUSCULAR

## 2015-10-31 MED ORDER — MIDAZOLAM HCL 5 MG/5ML IJ SOLN
INTRAMUSCULAR | Status: DC | PRN
  Administered 2015-10-31 (×2): 2 mg via INTRAVENOUS

## 2015-10-31 MED ORDER — MENTHOL 3 MG MT LOZG: 1.0000 | LOZENGE | OROMUCOSAL | Status: DC | PRN

## 2015-10-31 MED ORDER — HYDROMORPHONE HCL 1 MG/ML IJ SOLN
INTRAMUSCULAR | Status: AC
  Administered 2015-10-31: 0.5 mg via INTRAVENOUS
  Filled 2015-10-31: qty 1

## 2015-10-31 MED ORDER — ACETAMINOPHEN 10 MG/ML IV SOLN
INTRAVENOUS | Status: AC
  Filled 2015-10-31: qty 100

## 2015-10-31 MED ORDER — HYDROMORPHONE HCL 1 MG/ML IJ SOLN: 0.2500 mg | INTRAMUSCULAR | Status: DC | PRN

## 2015-10-31 MED ORDER — BUPIVACAINE IN DEXTROSE 0.75-8.25 % IT SOLN
INTRATHECAL | Status: DC | PRN
  Administered 2015-10-31: 12.5 mL via INTRATHECAL

## 2015-10-31 MED ORDER — ONDANSETRON HCL 4 MG PO TABS: 4.0000 mg | ORAL_TABLET | Freq: Four times a day (QID) | ORAL | Status: DC | PRN

## 2015-10-31 MED ORDER — HYDROMORPHONE HCL 1 MG/ML IJ SOLN
0.2500 mg | INTRAMUSCULAR | Status: DC | PRN
  Administered 2015-10-31 (×6): 0.5 mg via INTRAVENOUS

## 2015-10-31 MED ORDER — ENOXAPARIN SODIUM 30 MG/0.3ML ~~LOC~~ SOLN
30.0000 mg | Freq: Two times a day (BID) | SUBCUTANEOUS | Status: DC
  Administered 2015-11-01 – 2015-11-03 (×5): 30 mg via SUBCUTANEOUS
  Filled 2015-10-31 (×6): qty 0.3

## 2015-10-31 MED ORDER — NEOMYCIN-POLYMYXIN B GU 40-200000 IR SOLN
Status: AC
  Filled 2015-10-31: qty 16

## 2015-10-31 MED ORDER — CELECOXIB 200 MG PO CAPS
200.0000 mg | ORAL_CAPSULE | Freq: Two times a day (BID) | ORAL | Status: DC
  Administered 2015-10-31 – 2015-11-03 (×6): 200 mg via ORAL
  Filled 2015-10-31 (×6): qty 1

## 2015-10-31 MED ORDER — ACETAMINOPHEN 10 MG/ML IV SOLN
INTRAVENOUS | Status: DC | PRN
  Administered 2015-10-31: 1000 mg via INTRAVENOUS

## 2015-10-31 MED ORDER — ACETAMINOPHEN 650 MG RE SUPP
650.0000 mg | Freq: Four times a day (QID) | RECTAL | Status: DC | PRN
Start: 2015-10-31 — End: 2015-11-03

## 2015-10-31 MED ORDER — INFLUENZA VAC SPLIT QUAD 0.5 ML IM SUSY
0.5000 mL | PREFILLED_SYRINGE | INTRAMUSCULAR | Status: AC
  Administered 2015-11-01: 0.5 mL via INTRAMUSCULAR
  Filled 2015-10-31: qty 0.5

## 2015-10-31 MED ORDER — ONDANSETRON HCL 4 MG/2ML IJ SOLN: 4.0000 mg | Freq: Once | INTRAMUSCULAR | Status: DC | PRN

## 2015-10-31 MED ORDER — ACETAMINOPHEN 325 MG PO TABS
650.0000 mg | ORAL_TABLET | Freq: Four times a day (QID) | ORAL | Status: DC | PRN
  Administered 2015-11-02: 650 mg via ORAL
  Filled 2015-10-31: qty 2

## 2015-10-31 MED ORDER — ONDANSETRON HCL 4 MG/2ML IJ SOLN
INTRAMUSCULAR | Status: DC | PRN
  Administered 2015-10-31: 4 mg via INTRAVENOUS

## 2015-10-31 MED ORDER — LIDOCAINE HCL (CARDIAC) 20 MG/ML IV SOLN
INTRAVENOUS | Status: DC | PRN
  Administered 2015-10-31: 20 mg via INTRAVENOUS

## 2015-10-31 MED ORDER — CEFAZOLIN SODIUM-DEXTROSE 2-3 GM-% IV SOLR
2.0000 g | Freq: Once | INTRAVENOUS | Status: AC
  Administered 2015-10-31: 2 g via INTRAVENOUS

## 2015-10-31 MED ORDER — OXYCODONE HCL 5 MG PO TABS
5.0000 mg | ORAL_TABLET | ORAL | Status: DC | PRN
  Administered 2015-10-31: 10 mg via ORAL
  Administered 2015-10-31 – 2015-11-01 (×4): 5 mg via ORAL
  Filled 2015-10-31 (×3): qty 1
  Filled 2015-10-31: qty 2
  Filled 2015-10-31: qty 1

## 2015-10-31 MED ORDER — EPHEDRINE SULFATE 50 MG/ML IJ SOLN
INTRAMUSCULAR | Status: DC | PRN
  Administered 2015-10-31: 10 mg via INTRAVENOUS

## 2015-10-31 MED ORDER — ENOXAPARIN SODIUM 30 MG/0.3ML ~~LOC~~ SOLN: 30.0000 mg | Freq: Two times a day (BID) | SUBCUTANEOUS | Status: DC

## 2015-10-31 MED ORDER — FENTANYL CITRATE (PF) 100 MCG/2ML IJ SOLN
25.0000 ug | INTRAMUSCULAR | Status: DC | PRN
  Administered 2015-10-31 (×4): 25 ug via INTRAVENOUS

## 2015-10-31 MED ORDER — PHENYLEPHRINE HCL 10 MG/ML IJ SOLN
INTRAMUSCULAR | Status: AC
  Filled 2015-10-31: qty 1

## 2015-10-31 MED ORDER — KCL IN DEXTROSE-NACL 20-5-0.45 MEQ/L-%-% IV SOLN
INTRAVENOUS | Status: DC
  Administered 2015-10-31 – 2015-11-01 (×3): via INTRAVENOUS
  Filled 2015-10-31 (×11): qty 1000

## 2015-10-31 MED ORDER — PHENYLEPHRINE HCL 10 MG/ML IJ SOLN
INTRAMUSCULAR | Status: DC | PRN
  Administered 2015-10-31: 50 ug via INTRAVENOUS
  Administered 2015-10-31: 100 ug via INTRAVENOUS
  Administered 2015-10-31: 50 ug via INTRAVENOUS

## 2015-10-31 MED ORDER — PHENOL 1.4 % MT LIQD: 1.0000 | OROMUCOSAL | Status: DC | PRN

## 2015-10-31 MED ORDER — SODIUM CHLORIDE 0.9 % IV SOLN
INTRAVENOUS | Status: DC
  Administered 2015-10-31 (×4): via INTRAVENOUS

## 2015-10-31 MED ORDER — PROPOFOL 500 MG/50ML IV EMUL
INTRAVENOUS | Status: DC | PRN
  Administered 2015-10-31: 50 ug/kg/min via INTRAVENOUS

## 2015-10-31 MED ORDER — HYDROMORPHONE HCL 1 MG/ML IJ SOLN: 1.0000 mg | INTRAMUSCULAR | Status: DC | PRN

## 2015-10-31 MED ORDER — DIPHENHYDRAMINE HCL 50 MG/ML IJ SOLN
INTRAMUSCULAR | Status: DC | PRN
  Administered 2015-10-31: 12.5 mg via INTRAVENOUS

## 2015-10-31 MED ORDER — SCOPOLAMINE 1 MG/3DAYS TD PT72
1.0000 | MEDICATED_PATCH | TRANSDERMAL | Status: DC
  Administered 2015-10-31 – 2015-11-03 (×2): 1.5 mg via TRANSDERMAL
  Filled 2015-10-31 (×2): qty 1

## 2015-10-31 MED ORDER — SCOPOLAMINE 1 MG/3DAYS TD PT72
MEDICATED_PATCH | TRANSDERMAL | Status: AC
  Filled 2015-10-31: qty 1

## 2015-10-31 MED ORDER — FENTANYL CITRATE (PF) 100 MCG/2ML IJ SOLN
INTRAMUSCULAR | Status: AC
  Administered 2015-10-31: 25 ug via INTRAVENOUS
  Filled 2015-10-31: qty 2

## 2015-10-31 MED ORDER — FENTANYL CITRATE (PF) 100 MCG/2ML IJ SOLN
INTRAMUSCULAR | Status: DC | PRN
  Administered 2015-10-31: 100 ug via INTRAVENOUS

## 2015-10-31 MED ORDER — ONDANSETRON HCL 4 MG/2ML IJ SOLN
4.0000 mg | Freq: Four times a day (QID) | INTRAMUSCULAR | Status: DC | PRN
  Administered 2015-10-31 – 2015-11-02 (×5): 4 mg via INTRAVENOUS
  Filled 2015-10-31 (×6): qty 2

## 2015-10-31 MED ORDER — CEFAZOLIN SODIUM-DEXTROSE 2-3 GM-% IV SOLR
2.0000 g | Freq: Four times a day (QID) | INTRAVENOUS | Status: AC
  Administered 2015-10-31 (×2): 2 g via INTRAVENOUS
  Filled 2015-10-31 (×2): qty 50

## 2015-10-31 MED ORDER — NEOMYCIN-POLYMYXIN B GU 40-200000 IR SOLN
Status: DC | PRN
  Administered 2015-10-31: 14 mL

## 2015-10-31 SURGICAL SUPPLY — 47 items
AUTOTRANSFUS HAS 1/8 (MISCELLANEOUS) ×3
BLADE SAGITTAL WIDE XTHICK NO (BLADE) ×3 IMPLANT
BRUSH STANDARD PREP 14M (MISCELLANEOUS) IMPLANT
CANISTER SUCT 1200ML W/VALVE (MISCELLANEOUS) ×3 IMPLANT
CANISTER SUCT 3000ML (MISCELLANEOUS) ×3 IMPLANT
CAPT HIP TOTAL 2 ×3 IMPLANT
CATH TRAY METER 16FR LF (MISCELLANEOUS) ×3 IMPLANT
CHLORAPREP W/TINT 26ML (MISCELLANEOUS) ×3 IMPLANT
DRAPE TABLE BACK 80X90 (DRAPES) ×3 IMPLANT
DRSG OPSITE POSTOP 4X8 (GAUZE/BANDAGES/DRESSINGS) ×3 IMPLANT
DURAPREP 26ML APPLICATOR (WOUND CARE) ×3 IMPLANT
ELECT BLADE 6 FLAT ULTRCLN (ELECTRODE) ×3 IMPLANT
GAUZE PACK 2X3YD (MISCELLANEOUS) IMPLANT
GAUZE PETRO XEROFOAM 1X8 (MISCELLANEOUS) ×6 IMPLANT
GAUZE SPONGE 4X4 12PLY STRL (GAUZE/BANDAGES/DRESSINGS) ×3 IMPLANT
GLOVE BIOGEL PI IND STRL 8 (GLOVE) ×2 IMPLANT
GLOVE BIOGEL PI INDICATOR 8 (GLOVE) ×4
GLOVE SURG ORTHO 8.0 STRL STRW (GLOVE) ×6 IMPLANT
GOWN STRL REUS W/ TWL LRG LVL3 (GOWN DISPOSABLE) ×3 IMPLANT
GOWN STRL REUS W/TWL LRG LVL3 (GOWN DISPOSABLE) ×6
HANDPIECE SUCTION TUBG SURGILV (MISCELLANEOUS) ×3 IMPLANT
HOLDER FOLEY CATH W/STRAP (MISCELLANEOUS) ×3 IMPLANT
KIT SUCTION CATH 14FR (SUCTIONS) IMPLANT
NEEDLE MAYO 6 CRC TAPER PT (NEEDLE) ×3 IMPLANT
NS IRRIG 1000ML POUR BTL (IV SOLUTION) ×3 IMPLANT
PACK HIP PROSTHESIS (MISCELLANEOUS) ×3 IMPLANT
PAD ABD DERMACEA PRESS 5X9 (GAUZE/BANDAGES/DRESSINGS) ×3 IMPLANT
PAD GROUND ADULT SPLIT (MISCELLANEOUS) ×3 IMPLANT
PAD PREP 24X41 OB/GYN DISP (PERSONAL CARE ITEMS) ×3 IMPLANT
PILLOW ABDUC M (MISCELLANEOUS) ×3 IMPLANT
SOL .9 NS 3000ML IRR  AL (IV SOLUTION) ×2
SOL .9 NS 3000ML IRR UROMATIC (IV SOLUTION) ×1 IMPLANT
SPONGE LAP 18X18 5 PK (GAUZE/BANDAGES/DRESSINGS) ×3 IMPLANT
STAPLER SKIN PROX 35W (STAPLE) ×3 IMPLANT
STRAP SAFETY BODY (MISCELLANEOUS) ×3 IMPLANT
SUT ETHIBOND NAB CT1 #1 30IN (SUTURE) ×6 IMPLANT
SUT TICRON 2-0 30IN 311381 (SUTURE) ×15 IMPLANT
SUT VIC AB 0 CT1 36 (SUTURE) ×6 IMPLANT
SUT VIC AB 2-0 CT1 27 (SUTURE) ×2
SUT VIC AB 2-0 CT1 TAPERPNT 27 (SUTURE) ×1 IMPLANT
SUT VICRYL+ 3-0 36IN CT-1 (SUTURE) ×3 IMPLANT
SYSTEM AUTOTRANSFUS DUAL TROCR (MISCELLANEOUS) ×1 IMPLANT
TIP COAXIAL FEMORAL CANAL (MISCELLANEOUS) IMPLANT
TOWER CARTRIDGE SMART MIX (DISPOSABLE) IMPLANT
TUBE SUCT KAM VAC (TUBING) ×3 IMPLANT
TUBING CONNECTING 10 (TUBING) ×2 IMPLANT
TUBING CONNECTING 10' (TUBING) ×1

## 2015-10-31 NOTE — Op Note (Signed)
Martin Deleon, Martin Deleon NO.:  [id number]  MEDICAL RECORD NO.:  [id number]  LOCATION:  138A                         FACILITY:  [facility]  PHYSICIAN:  Margaretmary Eddy, MD        DATE OF BIRTH:  June 15, 1956  DATE OF PROCEDURE:  10/31/2015 DATE OF DISCHARGE:                              OPERATIVE REPORT   PREOPERATIVE DIAGNOSIS:  Severe degenerative arthritis, left hip.  POSTOPERATIVE DIAGNOSIS:  Severe degenerative arthritis, left hip.  PROCEDURE PERFORMED:  AML porous-coated left total hip arthroplasty.  SURGEON:  Margaretmary Eddy, MD  SURGEON:  Margaretmary Eddy, MD.  ASSISTANT:  Park Breed, MD.  ANESTHESIA:  Spinal.  COMPLICATIONS:  None.  ESTIMATED BLOOD LOSS:  700 mL.  DESCRIPTION OF PROCEDURE:  A 2 g of Ancef was given intravenously prior to the procedure.  Spinal anesthesia was induced.  The patient was turned over on to the right lateral decubitus position and secured in the usual manner with the hip grip system for left total hip arthroplasty.  The left hip and leg were thoroughly prepped with alcohol and ChloraPrep and draped in standard sterile fashion.  Standard posterolateral incision was made.  The dissection was carefully carried down through the fascia lata which was incised in the line of the skin incision.  Charnley retractor was placed.  Sciatic nerve was palpated deep within the wound and was protected throughout the case.  Piriformis tendon was dissected out and cut off the femur and tagged.  The remainder the external rotator muscles were carefully incised with the coagulation Bovie.  Hemostasis was maintained at all times with the Bovie. Capsule was completely dissected out and cut in a T-fashion and the ends were tagged with #2 Tycron. The hip was easily dislocated. Femoral neck was cut off with the appropriate angle and length. Retractors were then placed about the acetabulum and the acetabulum was cleared of all soft tissue debris.  The  acetabulum was serially reamed up to 51 mm and then the 52 mm trial acetabulum was impacted into place and was seemed to be adherent.  The wound was thoroughly irrigated multiple times.  The 52 mm trispiked porous-coated acetabulum was then impacted into place.  The trial liner with a 4 degree buildup was placed posterolaterally.  Proximal femur was then prepared in the usual manner, cut and then reamed up to 13 mm in diameter.  The 13.5 small stature broach was then impacted into place and seemed to be a good fit.  Trial reduction was difficult and it was found that the acetabulum had loosened during the reaming of the proximal femur.  The broach was removed.  The acetabulum was repositioned and impacted into place and was seemed to be in good position.  The hole eliminator was placed.  The wound was thoroughly irrigated multiple times with the pulsatile lavage. The polyethylene component was impacted into place with the buildup superiorly and laterally.  Proximal femur was then broached again. The 13.5 porous-coated AML femoral component was impacted into place and seemed to be a solid fit.  The +1/2 ball was impacted into place and then the hip was reduced.  There was seemed to be  excellent stability on range of motion.  The wound was thoroughly irrigated again multiple times with the pulsatile lavage.  There was minimal to no bleeding present.  No drain was used.  The posterior capsule was repaired with #2 Tycron and re-secured to the femur.  Piriformis tendon was re-secured to the femur. fascia lata was closed with #2 Tycron, subcutaneous tissue is closed with 2-0 Vicryl, skin was closed with the skin stapler.  Soft dressing was applied.  The patient was turned over onto the hospital bed carefully and the abductors pillow was placed, and the patient was returned to the recovery room in satisfactory condition having tolerated the procedure quite well.           ______________________________ Margaretmary Eddy, MD     CS/MEDQ  D:  10/31/2015  T:  10/31/2015  Job:  [id number]

## 2015-10-31 NOTE — Anesthesia Preprocedure Evaluation (Addendum)
Anesthesia Evaluation  Patient identified by MRN, date of birth, ID band Patient awake    Reviewed: Allergy & Precautions, NPO status , Patient's Chart, lab work & pertinent test results, reviewed documented beta blocker date and time   History of Anesthesia Complications (+) PONV and history of anesthetic complications  Airway Mallampati: II  TM Distance: >3 FB     Dental  (+) Chipped   Pulmonary former smoker,           Cardiovascular      Neuro/Psych  Headaches, Anxiety    GI/Hepatic   Endo/Other  diabetes, Type 1  Renal/GU      Musculoskeletal  (+) Arthritis ,   Abdominal   Peds  Hematology   Anesthesia Other Findings Insulin pump d/c'd. Will wait for BS in 200 range. EKG ok. Scop patch.  Reproductive/Obstetrics                           Anesthesia Physical Anesthesia Plan  ASA: II  Anesthesia Plan: Spinal   Post-op Pain Management:    Induction:   Airway Management Planned:   Additional Equipment:   Intra-op Plan:   Post-operative Plan:   Informed Consent: I have reviewed the patients History and Physical, chart, labs and discussed the procedure including the risks, benefits and alternatives for the proposed anesthesia with the patient or authorized representative who has indicated his/her understanding and acceptance.     Plan Discussed with: CRNA  Anesthesia Plan Comments:         Anesthesia Quick Evaluation

## 2015-10-31 NOTE — Progress Notes (Signed)
Dr Marcello Moores in to see pt - insulin pump turned OFF by patient @ 0645 as instructed by Dr. Marcello Moores.  Insulin pump taken to PACU as instructed by Dr. Marcello Moores

## 2015-10-31 NOTE — Evaluation (Signed)
Physical Therapy Evaluation Patient Details Name: Martin Deleon MRN: [id number] DOB: 24-Jan-1956 Today's Date: 10/31/2015   History of Present Illness  Pt underwent L THR posterior approach and is POD#0 at time of evaluation. Spoke with RN prior to evaluation who reports pt recently with significant bleeding from incision which was difficult to stop. Limited activity during PT evaluation due to recent bleeding at incision site  Clinical Impression  Limited mobility during evaluation due to recent bleeding from L hip incision site which was difficult to control per RN. Pt demonstrates excellent L hip strength for POD#0. He is able to complete all bed exercises as prescribed with 2 finger assist for SLR on LLE. Pt reports minimal increase in pain with bed exercises. Reviewed posterior hip precautions with patient. Pt would like to return home at discharge with [facility] PT. Pt will benefit from skilled PT services to address deficits in strength, balance, and mobility in order to return to full function at home.     Follow Up Recommendations Home health PT;Supervision - Intermittent    Equipment Recommendations  None recommended by PT    Recommendations for Other Services       Precautions / Restrictions Precautions Precautions: Posterior Hip Precaution Booklet Issued: Yes (comment) Restrictions Weight Bearing Restrictions: Yes LLE Weight Bearing: Weight bearing as tolerated      Mobility  Bed Mobility               General bed mobility comments: Deferred all mobility due to recent bleeding from incision site of L hip with difficulty controlling per RN  Transfers                    Ambulation/Gait                Stairs            Wheelchair Mobility    Modified Rankin (Stroke Patients Only)       Balance                                             Pertinent Vitals/Pain Pain Assessment: 0-10 Pain Score: 5  Pain Location: L  hip Pain Intervention(s): Limited activity within patient's tolerance;Monitored during session;Premedicated before session;Ice applied    Home Living Family/patient expects to be discharged to:: Private residence Living Arrangements: Spouse/significant other Available Help at Discharge: Family Type of Home: House Home Access: Stairs to enter Entrance Stairs-Rails: Left Entrance Stairs-Number of Steps: 2 Home Layout: One level Home Equipment: [place] - 2 wheels;Cane - single point;Shower seat (no wheelchair, no grab bars, no BSC)      Prior Function Level of Independence: Independent         Comments: Independent community ambulator without assistive device     Hand Dominance   Dominant Hand: Right    Extremity/Trunk Assessment   Upper Extremity Assessment: Overall WFL for tasks assessed           Lower Extremity Assessment: LLE deficits/detail   LLE Deficits / Details: Pt able to perform SLR with 2 finger assist. Independent SAQ, full DF/PF. Pt denies LLE numbness. RLE grossly WFL     Communication   Communication: No difficulties  Cognition Arousal/Alertness: Awake/alert Behavior During Therapy: WFL for tasks assessed/performed Overall Cognitive Status: Within Functional Limits for tasks assessed  General Comments      Exercises Total Joint Exercises Ankle Circles/Pumps: Strengthening;Both;15 reps;Supine Quad Sets: Strengthening;Both;15 reps;Supine Gluteal Sets: Both;Strengthening;15 reps;Supine Towel Squeeze: Strengthening;Both;15 reps;Supine Short Arc Quad: Strengthening;Both;15 reps;Supine Heel Slides: Strengthening;Both;15 reps;Supine Hip ABduction/ADduction: Strengthening;Both;15 reps;Supine Straight Leg Raises: Strengthening;Both;15 reps;Supine      Assessment/Plan    PT Assessment Patient needs continued PT services  PT Diagnosis Generalized weakness;Acute pain   PT Problem List Decreased strength;Decreased  activity tolerance;Decreased mobility;Pain  PT Treatment Interventions DME instruction;Gait training;Therapeutic activities;Therapeutic exercise;Balance training;Neuromuscular re-education;Patient/family education;Manual techniques   PT Goals (Current goals can be found in the Care Plan section) Acute Rehab PT Goals Patient Stated Goal: "I'm going to get back home. I want to get back to golf." PT Goal Formulation: With patient Time For Goal Achievement: 11/14/15 Potential to Achieve Goals: Good    Frequency BID   Barriers to discharge        Co-evaluation               End of Session   Activity Tolerance: Patient tolerated treatment well;Treatment limited secondary to medical complications (Comment) Patient left: in bed;with call bell/phone within reach;with bed alarm set;with family/visitor present (ice pack on hip, abduction wedge, RN notified no AV1 boots) Nurse Communication: Other (comment) (Pt needs AV1 boots)         Time: [id number] PT Time Calculation (min) (ACUTE ONLY): 25 min   Charges:   PT Evaluation $Initial PT Evaluation Tier I: 1 Procedure PT Treatments $Therapeutic Exercise: 8-22 mins   PT G Codes:       Martin Deleon PT, DPT   Martin Deleon 10/31/2015, 5:10 PM

## 2015-10-31 NOTE — Anesthesia Postprocedure Evaluation (Signed)
  Anesthesia Post-op Note  Patient: Martin Deleon  Procedure(s) Performed: Procedure(s): TOTAL HIP ARTHROPLASTY (Left)  Anesthesia type:Spinal  Patient location: PACU  Post pain: Pain level controlled  Post assessment: Post-op Vital signs reviewed, Patient's Cardiovascular Status Stable, Respiratory Function Stable, Patent Airway and No signs of Nausea or vomiting  Post vital signs: Reviewed and stable  Last Vitals:  Filed Vitals:   10/31/15 0602  BP: 150/85  Pulse: 87  Temp: 35.8 C  Resp: 16    Level of consciousness: awake, alert  and patient cooperative  Complications: No apparent anesthesia complications

## 2015-10-31 NOTE — H&P (Signed)
  59 year old male with severe osteoarthritis left hip.  History and physical exam has been inserted into the record in the form of a paper document.  Heart and lungs clear.  ENT normal.  Plan: left total hip arthroplasty.

## 2015-10-31 NOTE — Progress Notes (Signed)
Pt arrived on unit. Pts vital signs stable. Skin check done with Hassan Rowan. Dressing on inscision is bloody. Insulin pump is in place. Will continue to monitor.

## 2015-10-31 NOTE — H&P (Signed)
To OR with thermal cap in place, IV abx Ancef 2 gm and sacral dressing sent to OR with patient

## 2015-10-31 NOTE — Progress Notes (Signed)
Dr Cipriano Mile notified of pt incision bleeding , orders received

## 2015-10-31 NOTE — Progress Notes (Signed)
Blood sugar is 297  Pt will regulate his insulin pump and recheck sugar in one hour

## 2015-10-31 NOTE — Anesthesia Procedure Notes (Signed)
Spinal Patient location during procedure: OR Staffing Anesthesiologist: Gunnar Bulla Performed by: anesthesiologist  Preanesthetic Checklist Completed: patient identified, site marked, surgical consent, pre-op evaluation, timeout performed, IV checked and risks and benefits discussed Spinal Block Patient position: sitting Prep: Betadine Patient monitoring: heart rate, cardiac monitor, continuous pulse ox and blood pressure Approach: midline Location: L3-4 Injection technique: single-shot Needle Needle type: Pencil-Tip  Needle gauge: 25 G Needle length: 9 cm Assessment Sensory level: T10 Additional Notes 0732 marcaine 12.5mg  intrathecally.

## 2015-10-31 NOTE — Transfer of Care (Signed)
Immediate Anesthesia Transfer of Care Note  Patient: Martin Deleon  Procedure(s) Performed: Procedure(s): TOTAL HIP ARTHROPLASTY (Left)  Patient Location: PACU  Anesthesia Type:Spinal  Level of Consciousness: awake  Airway & Oxygen Therapy: Patient connected to face mask oxygen  Post-op Assessment: Report given to RN  Post vital signs: stable  Last Vitals:  Filed Vitals:   10/31/15 0602  BP: 150/85  Pulse: 87  Temp: 35.8 C  Resp: 16    Complications: No apparent anesthesia complications

## 2015-10-31 NOTE — Brief Op Note (Signed)
10/31/2015  10:19 AM  PATIENT:  Martin Deleon  59 y.o. male  PRE-OPERATIVE DIAGNOSIS:  osteoarthritis left hip  POST-OPERATIVE DIAGNOSIS:  osteoarthritis left hip  PROCEDURE:  Procedure(s): TOTAL HIP ARTHROPLASTY (Left)  SURGEON:  Surgeon(s) and Role:    * Christophe Louis, MD - Primary  PHYSICIAN ASSISTANT:   ASSISTANTS: Dr. Earnestine Leys   ANESTHESIA:   spinal  EBL:  Total I/O In: [id number] [I.V.:2600] Out: 360 [Urine:60; Blood:300]  BLOOD ADMINISTERED:none  DRAINS: none   LOCAL MEDICATIONS USED:  NONE  SPECIMEN:  Source of Specimen:  femoral head  DISPOSITION OF SPECIMEN:  PATHOLOGY  COUNTS:  YES  TOURNIQUET:  * No tourniquets in log *  DICTATION: .Other Dictation: Dictation Number [id number]  PLAN OF CARE: Admit to inpatient   PATIENT DISPOSITION:  PACU - hemodynamically stable.   Delay start of Pharmacological VTE agent (>24hrs) due to surgical blood loss or risk of bleeding: no

## 2015-10-31 NOTE — Progress Notes (Signed)
Applied ice pack to left hip  Heels off bed

## 2015-10-31 NOTE — Progress Notes (Signed)
Blood sugar 285  Insulin pump applied at basal rate   To recheck sugar at 10:35

## 2015-11-01 LAB — CBC
HEMATOCRIT: 31.6 % — AB (ref 40.0–52.0)
HEMOGLOBIN: 10.9 g/dL — AB (ref 13.0–18.0)
MCH: 32 pg (ref 26.0–34.0)
MCHC: 34.5 g/dL (ref 32.0–36.0)
MCV: 93 fL (ref 80.0–100.0)
Platelets: 197 10*3/uL (ref 150–440)
RBC: 3.4 MIL/uL — AB (ref 4.40–5.90)
RDW: 12.7 % (ref 11.5–14.5)
WBC: 9 10*3/uL (ref 3.8–10.6)

## 2015-11-01 LAB — GLUCOSE, CAPILLARY: GLUCOSE-CAPILLARY: 238 mg/dL — AB (ref 65–99)

## 2015-11-01 MED ORDER — LISINOPRIL 20 MG PO TABS
20.0000 mg | ORAL_TABLET | Freq: Every day | ORAL | Status: DC
  Administered 2015-11-01 – 2015-11-03 (×3): 20 mg via ORAL
  Filled 2015-11-01 (×4): qty 1

## 2015-11-01 MED ORDER — INSULIN PUMP
Freq: Three times a day (TID) | SUBCUTANEOUS | Status: DC
  Administered 2015-11-01 – 2015-11-03 (×9): via SUBCUTANEOUS
  Filled 2015-11-01: qty 1

## 2015-11-01 MED ORDER — GABAPENTIN 400 MG PO CAPS
800.0000 mg | ORAL_CAPSULE | Freq: Three times a day (TID) | ORAL | Status: DC
  Administered 2015-11-01 – 2015-11-03 (×7): 800 mg via ORAL
  Filled 2015-11-01 (×7): qty 2

## 2015-11-01 MED ORDER — SIMVASTATIN 40 MG PO TABS
40.0000 mg | ORAL_TABLET | Freq: Every day | ORAL | Status: DC
  Administered 2015-11-01 – 2015-11-02 (×2): 40 mg via ORAL
  Filled 2015-11-01 (×2): qty 1

## 2015-11-01 MED ORDER — HYDROCODONE-ACETAMINOPHEN 7.5-325 MG PO TABS
1.0000 | ORAL_TABLET | ORAL | Status: DC | PRN
  Administered 2015-11-01 – 2015-11-02 (×4): 1 via ORAL
  Filled 2015-11-01 (×5): qty 1

## 2015-11-01 MED ORDER — TIZANIDINE HCL 4 MG PO TABS
4.0000 mg | ORAL_TABLET | Freq: Three times a day (TID) | ORAL | Status: DC
  Administered 2015-11-01 – 2015-11-03 (×7): 4 mg via ORAL
  Filled 2015-11-01 (×7): qty 1

## 2015-11-01 NOTE — Progress Notes (Signed)
Clinical Social Worker (CSW) received SNF consult. PT is recommending home health. RN Case Manager aware of above. Please reconsult if future social work needs arise. CSW signing off.   Neomi Laidler Morgan, LCSWA [phone] 

## 2015-11-01 NOTE — Progress Notes (Signed)
Patient ID: Martin Deleon, male   DOB: 07-23-1956, 59 y.o.   MRN: [id number] Mental status normal. Feels well. Neurovascular exam intact. Tolerating PT well. Hct. 31 Continue with present orders and PT.

## 2015-11-01 NOTE — Care Management (Signed)
Patient was working with PT. Patient's wife states he has a rolling walker he can use at home. List of home health care agencies left with wife. RNCM will continue to follow.

## 2015-11-01 NOTE — Progress Notes (Signed)
Physical Therapy Treatment Patient Details Name: Martin Deleon MRN: [id number] DOB: 1956/08/17 Today's Date: 11/01/2015    History of Present Illness Pt underwent L THR posterior approach and is POD#1at time of evaluation. Spoke with RN prior to evaluation who reports pt recently with significant bleeding from incision which was difficult to stop. Limited activity during PT evaluation due to recent bleeding at incision site    PT Comments    Pt demonstrates minimal foot clearance with ambulation initially, however he is able to improve step length and foot clearance with cuing. Patient demonstrates improved bed mobility and sit to stand transfer technique with RW. Patient demonstrates decreased pain during this session and no notable bleeding from incision as noted yesterday. Patient participated in all there-ex without significant increase in pain, though he notes he is learning to trust his LE more. Skilled PT services are indicated to continue to address the above deficits.   Follow Up Recommendations  Home health PT;Supervision - Intermittent     Equipment Recommendations  None recommended by PT    Recommendations for Other Services       Precautions / Restrictions Precautions Precautions: Posterior Hip Precaution Booklet Issued: Yes (comment) Restrictions Weight Bearing Restrictions: Yes LLE Weight Bearing: Weight bearing as tolerated    Mobility  Bed Mobility Overal bed mobility: Needs Assistance Bed Mobility: Supine to Sit     Supine to sit: Min guard     General bed mobility comments: Patient educated on use of towel to assist with bringing LLE off the EOB.   Transfers Overall transfer level: Needs assistance Equipment used: Rolling walker (2 wheeled) Transfers: Sit to/from Stand Sit to Stand: Min guard         General transfer comment: Patient educated on hand placement with transfer, he is able to complete in slightly delayed time, no loss of balance  noted.   Ambulation/Gait Ambulation/Gait assistance: Min guard Ambulation Distance (Feet): 30 Feet Assistive device: Rolling walker (2 wheeled) Gait Pattern/deviations: Decreased step length - right;Decreased step length - left;Trunk flexed;Decreased stride length   Gait velocity interpretation: <1.8 ft/sec, indicative of risk for recurrent falls General Gait Details: Pt initially crawls his toes forward on the floor, cued extensively to flex his knee to advance into swing phase. Patient requires continual cuing with gait mechanics and technique with completing turns with RW. No loss of balance noted.    Stairs            Wheelchair Mobility    Modified Rankin (Stroke Patients Only)       Balance Overall balance assessment: Needs assistance Sitting-balance support: No upper extremity supported;Feet supported Sitting balance-Leahy Scale: Good Sitting balance - Comments: No balance deficits noted.    Standing balance support: Bilateral upper extremity supported Standing balance-Leahy Scale: Fair Standing balance comment: No loss of balance noted during ambulation, heavy reliance on UEs. Poor gait speed and minimal foot clearance noted with LLE                    Cognition Arousal/Alertness: Awake/alert Behavior During Therapy: WFL for tasks assessed/performed Overall Cognitive Status: Within Functional Limits for tasks assessed                      Exercises Total Joint Exercises Ankle Circles/Pumps: AROM;Both;10 reps Gluteal Sets: AROM;Both;10 reps Heel Slides: AROM;Both;10 reps Hip ABduction/ADduction: AROM;Both;10 reps Straight Leg Raises: AROM;Both;10 reps Long Arc Quad: AROM;Both;10 reps    General Comments General comments (skin integrity,  edema, etc.): Dressing place, no bleeding noted.       Pertinent Vitals/Pain Pain Assessment:  (Patient stated he can handle the pain in supine, it has decreased since earlier in the day. ) Pain Score: 5   Pain Location: left hip Pain Intervention(s): Limited activity within patient's tolerance;Monitored during session;Premedicated before session;Repositioned    Home Living Family/patient expects to be discharged to:: Private residence Living Arrangements: Spouse/significant other Available Help at Discharge: Family Type of Home: House Home Access: Stairs to enter Entrance Stairs-Rails: Left Home Layout: One level Home Equipment: Environmental consultant - 2 wheels;Cane - single point;Shower seat      Prior Function Level of Independence: Independent      Comments: Independent community ambulator without assistive device   PT Goals (current goals can now be found in the care plan section) Acute Rehab PT Goals Patient Stated Goal: To go home PT Goal Formulation: With patient Time For Goal Achievement: 11/14/15 Potential to Achieve Goals: Good Progress towards PT goals: Progressing toward goals    Frequency  BID    PT Plan Current plan remains appropriate    Co-evaluation             End of Session Equipment Utilized During Treatment: Gait belt Activity Tolerance: Patient tolerated treatment well Patient left: in chair;with call bell/phone within reach;with chair alarm set;with family/visitor present (With OT in room)     Time: [id number] PT Time Calculation (min) (ACUTE ONLY): 28 min  Charges:  $Gait Training: 8-22 mins $Therapeutic Exercise: 8-22 mins                    G Codes:     Kerman Passey, PT, DPT    11/01/2015, 1:41 PM

## 2015-11-01 NOTE — Progress Notes (Addendum)
Inpatient Diabetes Program Recommendations  AACE/ADA: New Consensus Statement on Inpatient Glycemic Control (2015)  Target Ranges:  Prepandial:   less than 140 mg/dL      Peak postprandial:   less than 180 mg/dL (1-2 hours)      Critically ill patients:  140 - 180 mg/dL   Review of Glycemic Control  Diabetes history: DM1 Outpatient Diabetes medications: Medtronic Insulin pump with Humalog Current orders for Inpatient glycemic control: Medtronic insulin pump with Humalog  Inpatient Diabetes Program Recommendations: IV fluids: Patient has D5 1/2 NS with 20 mEq KCL at 125 ml/hr infusing. Please re-evaluate need for dextrose in IV fluids. Insulin Pumpl: In reviewing the chart, note patient is using his insulin pump for inpatient glycemic control. However, there is no order for the insulin pump. Entered the insulin pump order set (with signature required from Dr. Christophe Louis). Glucose has ranged from 224-297 mg/dl over the past 24 hours.  NURSING: Please print out insulin pump contract and flow sheet and for patient. Patient will need to sign the insulin pump contract and it is to be placed in the chart as it is part of the medical record. The patient insulin pump flow sheet is to be left at the bedside and the patient will use it to document glucose, insulin bolus given by pump, and carbohydrates consumed. RN will use the flow sheet to chart in the [facility] and on the Nurse Insulin Pump Flow Sheet in EPIC. RN should be documenting on the Nursing Insulin Pump Flow Sheet at least once a shift. Patient will need to have extra insulin pump supplies at the bedside at all times. The insulin pump infusion site should be changed out by the patient every 3 days (72 hours).   11/01/15@13 :10-Spoke with patient regarding diabetes and home regimen for diabetes management.  Patient states that he was diagnosed with Type 1 diabetes at the age of 59 years old and he is followed by Dr. Selinda Flavin at [facility] for diabetes  management. Patient uses a Medtronic insulin pump with Humalog insulin as an outpatient. Patient has insulin pump connected and has been using the insulin pump for glycemic control since being admitted. Reviewed insulin pump settings with patient and they are as follows:  Basal insulin  12A 1.7 units/hour 3A 1.7 units/hour 6A 1.3 units/hour 11A 1.0 units/hour 4P 1.7 units/hour Total daily basal insulin: 35.3 units/24 hours  Carb Coverage 1:10 1 unit for every 10 grams of carbohydrates  Insulin Sensitivity 1:60 1 unit drops blood glucose 60  mg/dl  Target Glucose Goals 12A 140-140 mg/dl  In talking with the patient he states that his blood glucose varies and his last A1C was 9.5% on 10/25/2015.  Patient states that he has been in a lot of pain lately and his glucose is more elevated because of the pain. Patient states that he checks his glucose 6-10 times per day and that he plans to go back on his continuous glucose monitoring soon which will also help with making insulin pump adjustments since it provides more data for the doctor to use. Discussed importance of glycemic control short term and long term and stressed the increased risk of infection and decrease wound healing with uncontrolled diabetes. Patient is very knowledgeable about diabetes and his insulin pump. Patient states that once he recovers from hip surgery he plans to be much more active and he is going to work diligently with his Endocrinologist to get better glycemic control.  Patient verbalized understanding of information discussed  and states that he does not have any further questions related to diabetes at this time.    Thanks, Barnie Alderman, RN, MSN, CDE Diabetes Coordinator Inpatient Diabetes Program [phone] (Team Pager from [place] to [place]) [phone] ([facility] office) [phone] [facility] office) [phone] [facility] office)

## 2015-11-01 NOTE — Evaluation (Signed)
Occupational Therapy Evaluation Patient Details Name: Martin Deleon MRN: [id number] DOB: 17-Dec-1955 Today's Date: 11/01/2015    History of Present Illness Pt underwent L THR posterior approach and is POD#1at time of evaluation.    Clinical Impression   This patient is a 59 year old male who came to [facility] for a L THR (posterior approach) He had been independent and works full time in [employer]. He now needs some assist and would benefit from Occupational Therapy for ADL/functional mobility training while .staying within hip precautioins (posterior approach)     Follow Up Recommendations       Equipment Recommendations       Recommendations for Other Services       Precautions / Restrictions Precautions Precautions: Posterior Hip Precaution Booklet Issued: Yes (comment) (per PT) Restrictions Weight Bearing Restrictions: Yes LLE Weight Bearing: Weight bearing as tolerated      Mobility Bed Mobility                  Transfers                      Balance                                            ADL                                         General ADL Comments: Had been independent with ADL and works full time. Patient declined actual dressing secondary to being hot, but willing to practice techniques for lower body dressing to stay within hip precautions (posterior approach) Patient practiced technique for lower body dressing using hip hit with verbal cues for techniques.     Vision     Perception     Praxis      Pertinent Vitals/Pain Pain Score: 5  Pain Location: left hip     Hand Dominance Right   Extremity/Trunk Assessment Upper Extremity Assessment Upper Extremity Assessment: Overall WFL for tasks assessed   Lower Extremity Assessment Lower Extremity Assessment: Defer to PT evaluation       Communication Communication Communication: No difficulties   Cognition  Arousal/Alertness: Awake/alert Behavior During Therapy: WFL for tasks assessed/performed Overall Cognitive Status: Within Functional Limits for tasks assessed                     General Comments       Exercises       Shoulder Instructions      Home Living Family/patient expects to be discharged to:: Private residence Living Arrangements: Spouse/significant other Available Help at Discharge: Family Type of Home: House Home Access: Stairs to enter Technical brewer of Steps: 2 Entrance Stairs-Rails: Left Home Layout: One level     Bathroom Shower/Tub: International aid/development worker Accessibility: Yes   Home Equipment: Environmental consultant - 2 wheels;Cane - single point;Shower seat          Prior Functioning/Environment Level of Independence: Independent        Comments: Independent community ambulator without assistive device    OT Diagnosis: Acute pain   OT Problem List:     OT Treatment/Interventions: Self-care/ADL training    OT Goals(Current goals can be found in  the care plan section) Acute Rehab OT Goals Patient Stated Goal: To go home OT Goal Formulation: With patient/family Time For Goal Achievement: 11/15/15 Potential to Achieve Goals: Good  OT Frequency: Min 1X/week   Barriers to D/C:            Co-evaluation              End of Session Equipment Utilized During Treatment:  (hip kit)  Activity Tolerance:   Patient left: in chair;with call bell/phone within reach;with chair alarm set;with family/visitor present   Time: 1638-4536 OT Time Calculation (min): 15 min Charges:  OT General Charges $OT Visit: 1 Procedure OT Evaluation $Initial OT Evaluation Tier I: 1 Procedure G-Codes:    Myrene Galas, MS/OTR/L  11/01/2015, 10:14 AM

## 2015-11-01 NOTE — Progress Notes (Signed)
Physical Therapy Treatment Patient Details Name: Martin Deleon MRN: [id number] DOB: [birth year] Today's Date: 11/01/2015    History of Present Illness Pt underwent L THR posterior approach and is POD#1at time of evaluation. Spoke with RN prior to evaluation who reports pt recently with significant bleeding from incision which was difficult to stop. Limited activity during PT evaluation due to recent bleeding at incision site    PT Comments    Patient demonstrates significantly improved L quad strength and ambulation mechanics during this session. He is able to ascend 4 steps with unilateral railing, though unsteadiness noted on ascent of steps. Patient demonstrates step through pattern with gait with appropriate mechanics demonstrated. Patient not formally assessed on balance, but aside from ascending steps he does not demonstrate any loss of balance throughout ambulation. Skilled PT services continue to be indicated to address his mobility deficits.   Follow Up Recommendations  Home health PT;Supervision - Intermittent     Equipment Recommendations  None recommended by PT    Recommendations for Other Services       Precautions / Restrictions Precautions Precautions: Posterior Hip Precaution Booklet Issued: Yes (comment) Restrictions Weight Bearing Restrictions: Yes LLE Weight Bearing: Weight bearing as tolerated    Mobility  Bed Mobility Overal bed mobility: Needs Assistance Bed Mobility: Sit to Supine       Sit to supine: Min assist;Min guard   General bed mobility comments: Patient provided towel underneath LLE to assist with bringing LLE over the threshold with very minimal assistance to complete transfer.   Transfers Overall transfer level: Needs assistance Equipment used: Rolling walker (2 wheeled) Transfers: Sit to/from Stand Sit to Stand: Supervision         General transfer comment: Patient demonstrated sit to stand x 2, on 2nd attempt patient educated on  hand placement for transfer, no loss of balance noted.   Ambulation/Gait Ambulation/Gait assistance: Supervision Ambulation Distance (Feet): 100 Feet Assistive device: Rolling walker (2 wheeled) Gait Pattern/deviations: Step-through pattern   Gait velocity interpretation: <1.8 ft/sec, indicative of risk for recurrent falls General Gait Details: Patient demonstrates significantly improved gait mechanics during this session. He is able to swing through LEs bilaterally with improved knee flexion to initiate swing phase on LLE.    Stairs Stairs: Yes Stairs assistance: Min assist Stair Management: One rail Left Number of Stairs: 4 General stair comments: Patient demonstrates hesitancy with WBing on LLE to ascend, he is somewhat unsteady ascending 4 steps though no definite loss of balance. Patient demonstrates no lateral sway or unsteadiness on descent.   Wheelchair Mobility    Modified Rankin (Stroke Patients Only)       Balance Overall balance assessment: Needs assistance Sitting-balance support: No upper extremity supported Sitting balance-Leahy Scale: Good Sitting balance - Comments: No balance deficits noted.    Standing balance support: Bilateral upper extremity supported Standing balance-Leahy Scale: Fair Standing balance comment: Patient demonstrates significantly improved balance with ambulation and gait mechanics than previous session. No formal balance test measured during this session.                     Cognition Arousal/Alertness: Awake/alert Behavior During Therapy: WFL for tasks assessed/performed Overall Cognitive Status: Within Functional Limits for tasks assessed                      Exercises Total Joint Exercises Ankle Circles/Pumps: AROM;Both;10 reps Gluteal Sets: AROM;Both;10 reps Heel Slides: AROM;Both;10 reps Hip ABduction/ADduction: AROM;Both;10 reps Straight Leg Raises: AROM;Left;10  reps Long Arc Quad: AROM;Both;10 reps Other  Exercises Other Exercises: sit to stand training x 2 with cuing for hand placement.  Other Exercises: Standing marching with HHA x2 for 10 repetitions bilaterally, standing heel raises x 10 with bilateral HHA.     General Comments        Pertinent Vitals/Pain Pain Assessment:  (Pt reports that his hip does not hurt anywhere near as much as previous session.) Pain Location: L hip  Pain Intervention(s): Limited activity within patient's tolerance;Monitored during session;Premedicated before session;Repositioned;Ice applied    Home Living                      Prior Function            PT Goals (current goals can now be found in the care plan section) Acute Rehab PT Goals Patient Stated Goal: To go home PT Goal Formulation: With patient Time For Goal Achievement: 11/14/15 Potential to Achieve Goals: Good Progress towards PT goals: Progressing toward goals    Frequency  BID    PT Plan Current plan remains appropriate    Co-evaluation             End of Session Equipment Utilized During Treatment: Gait belt Activity Tolerance: Patient tolerated treatment well Patient left: in bed;with call bell/phone within reach;with bed alarm set     Time: [id number] PT Time Calculation (min) (ACUTE ONLY): 23 min  Charges:  $Gait Training: 8-22 mins $Therapeutic Exercise: 8-22 mins                    G Codes:      Kerman Passey, PT, DPT    11/01/2015, 3:08 PM

## 2015-11-01 NOTE — Care Management Note (Addendum)
Case Management Note  Patient Details  Name: Martin Deleon MRN: [id number] Date of Birth: [birth year]  Subjective/Objective:                  Met with patient to discuss discharge planning. He hopes to return home tomorrow with his wife. He has a rolling walker and cane available at home. He states that he would like to use [facility] for HHPT.   Action/Plan: Referral made to [facility]. RNCM will continue to follow.   Expected Discharge Date:                  Expected Discharge Plan:     In-House Referral:     Discharge planning Services  CM Consult  Post Acute Care Choice:  Home Health Choice offered to:  Patient  DME Arranged:    DME Agency:     HH Arranged:  PT [place]:  [employer]  Status of Service:  In process, will continue to follow  Medicare Important Message Given:    Date Medicare IM Given:    Medicare IM give by:    Date Additional Medicare IM Given:    Additional Medicare Important Message give by:     If discussed at [place] of Stay Meetings, dates discussed:    Additional Comments:  Marshell Garfinkel, RN 11/01/2015, 3:08 PM

## 2015-11-02 LAB — GLUCOSE, CAPILLARY
GLUCOSE-CAPILLARY: 248 mg/dL — AB (ref 65–99)
GLUCOSE-CAPILLARY: 190 mg/dL — AB (ref 65–99)
Glucose-Capillary: 117 mg/dL — ABNORMAL HIGH (ref 65–99)
Glucose-Capillary: 226 mg/dL — ABNORMAL HIGH (ref 65–99)

## 2015-11-02 LAB — CBC
HEMATOCRIT: 30.7 % — AB (ref 40.0–52.0)
HEMOGLOBIN: 10.5 g/dL — AB (ref 13.0–18.0)
MCH: 31.8 pg (ref 26.0–34.0)
MCHC: 34.3 g/dL (ref 32.0–36.0)
MCV: 92.7 fL (ref 80.0–100.0)
Platelets: 183 10*3/uL (ref 150–440)
RBC: 3.31 MIL/uL — ABNORMAL LOW (ref 4.40–5.90)
RDW: 12.5 % (ref 11.5–14.5)
WBC: 10.5 10*3/uL (ref 3.8–10.6)

## 2015-11-02 LAB — SURGICAL PATHOLOGY

## 2015-11-02 MED ORDER — HYDROMORPHONE HCL 2 MG PO TABS
2.0000 mg | ORAL_TABLET | ORAL | Status: DC | PRN
Start: 2015-11-02 — End: 2015-11-03
  Administered 2015-11-02 – 2015-11-03 (×2): 2 mg via ORAL
  Filled 2015-11-02 (×2): qty 1

## 2015-11-02 NOTE — Patient Instructions (Signed)
Pt ed on AE use in LB dressing

## 2015-11-02 NOTE — Progress Notes (Addendum)
Physical Therapy Treatment Patient Details Name: Martin Deleon MRN: [id number] DOB: 1956-07-31 Today's Date: 11/02/2015    History of Present Illness Pt underwent L THR posterior approach and is POD#1at time of evaluation. Spoke with RN prior to evaluation who reports pt recently with significant bleeding from incision which was difficult to stop. Limited activity during PT evaluation due to recent bleeding at incision site    PT Comments    Pt demonstrates excellent progress with therapy on this date. He is able to perform a full lap around RN station with improving gait speed and mechanics. He is also able to ascend/descend 4 stairs safely and confidently without LOB. At this time pt has met all PT barriers to discharge and will be appropriate to discharge home with [facility] PT and intermittent supervision from wife when medically appropriate. Will continue to progress ambulation distance, exercise intensity, and repeat stair training (if necessary). Will discuss car transfers at next PT session. Pt will benefit from skilled PT services to address deficits in strength, balance, and mobility in order to return to full function at home.    Follow Up Recommendations  Home health PT;Supervision - Intermittent     Equipment Recommendations  3in1 (PT) (Pt having BSC delivered to house)    Recommendations for Other Services       Precautions / Restrictions Precautions Precautions: Posterior Hip Precaution Booklet Issued: Yes (comment) Restrictions Weight Bearing Restrictions: Yes LLE Weight Bearing: Weight bearing as tolerated    Mobility  Bed Mobility Overal bed mobility: Needs Assistance Bed Mobility: Supine to Sit     Supine to sit: Min guard     General bed mobility comments: Pt provided instruction for sequencing to transfer to sitting toward the L. Pt is able to demonstrate good sequencing and strength with L hip flexion and abduction. Use of bed rails. Pt does not violate hip  precautions.  Transfers Overall transfer level: Needs assistance Equipment used: Rolling walker (2 wheeled) Transfers: Sit to/from Stand Sit to Stand: Min guard         General transfer comment: Pt demonstrates good sequencing and stability with transfer. Pt is able to scoot toward EOB and rise straight up to avoid violating hip flexion precaustions. Practiced transfer with BSC over toilet to simulate home environment  Ambulation/Gait Ambulation/Gait assistance: Min guard Ambulation Distance (Feet): 300 Feet Assistive device: Rolling walker (2 wheeled) Gait Pattern/deviations: Step-through pattern Gait velocity: Decreased but functional for household mobility   General Gait Details: Considerable improvement in gait quality and distance on this date. Pt demonstrates excellent L heels strike at initial contact and push off at terminal stance. Pt initially with slight decreased in R step length but is able to equalize with increased distance. Walker height adjusted for patient and pt encouraged to decrease UE reliance for WB during ambulation. Pt able to demonstrate multiple steps with only 1-2 finger support on walker. Speed gradually increases and pt cued for more erect posture with walker adjustment   Stairs Stairs: Yes   Stair Management: One rail Right Number of Stairs: 4 General stair comments: Pt reports that he will be entering house through garage with 2 steps and R rail. Pt requires one correction for proper sequencing during descent but otherwise demonstrates good safety and strength with stair ascend/descend  Wheelchair Mobility    Modified Rankin (Stroke Patients Only)       Balance Overall balance assessment: Needs assistance   Sitting balance-Leahy Scale: Good  Standing balance-Leahy Scale: Fair                      Cognition Arousal/Alertness: Awake/alert Behavior During Therapy: WFL for tasks assessed/performed Overall Cognitive Status:  Within Functional Limits for tasks assessed                      Exercises Total Joint Exercises Ankle Circles/Pumps: Strengthening;Both;15 reps;Supine Quad Sets: Strengthening;Both;15 reps;Supine Gluteal Sets: Strengthening;Both;15 reps;Supine Towel Squeeze: Strengthening;Both;15 reps;Supine Heel Slides: Strengthening;Left;15 reps;Supine Hip ABduction/ADduction: Strengthening;Left;15 reps;Supine (Performed in sitting x 15 bilateral) Straight Leg Raises: Strengthening;Left;15 reps;Supine Long Arc Quad: Strengthening;Left;15 reps;Seated Knee Flexion: Strengthening;Left;15 reps;Seated Marching in Standing: Strengthening;Both;15 reps;Seated Other Exercises Other Exercises: Standing marches x 10 bilateral, standing SLR x 10 bilateral, standing mini squats x 10    General Comments        Pertinent Vitals/Pain Pain Assessment: 0-10 Pain Score: 5  Pain Location: L hip Pain Intervention(s): Monitored during session;Premedicated before session    Home Living                      Prior Function            PT Goals (current goals can now be found in the care plan section) Acute Rehab PT Goals Patient Stated Goal: To go home PT Goal Formulation: With patient Time For Goal Achievement: 11/14/15 Potential to Achieve Goals: Good Progress towards PT goals: Progressing toward goals    Frequency  BID    PT Plan Current plan remains appropriate    Co-evaluation             End of Session Equipment Utilized During Treatment: Gait belt Activity Tolerance: Patient tolerated treatment well Patient left: with call bell/phone within reach;in chair;with chair alarm set;with family/visitor present;with SCD's reapplied     Time: 0601-5615 PT Time Calculation (min) (ACUTE ONLY): 28 min  Charges:  $Gait Training: 8-22 mins $Therapeutic Exercise: 8-22 mins                    G Codes:      Martin Deleon Martin Deleon PT, DPT   Martin Deleon 11/02/2015, 10:51 AM

## 2015-11-02 NOTE — Progress Notes (Signed)
Physical Therapy Treatment Patient Details Name: Martin Deleon MRN: [id number] DOB: December 23, 1955 Today's Date: 11/02/2015    History of Present Illness Pt underwent L THR posterior approach and is POD#1at time of evaluation. Spoke with RN prior to evaluation who reports pt recently with significant bleeding from incision which was difficult to stop. Limited activity during PT evaluation due to recent bleeding at incision site    PT Comments    Patient has made remarkable progress and completed all PT mobility goals. Patient demonstrates no loss of balance with RW, switched to [facility] and demonstrates appropriate sequencing, mechanics, and no loss of balance. Patient is able to ambulate community distances and ascend/descend the stairs required to enter his home without complication. Patient would benefit from outpatient PT services to address his strength and balance deficits as he appears to have progressed quite well from previous sessions.   Follow Up Recommendations  Supervision - Intermittent;Outpatient PT     Equipment Recommendations  3in1 (PT)    Recommendations for Other Services       Precautions / Restrictions Precautions Precautions: Posterior Hip Precaution Booklet Issued: Yes (comment) Restrictions Weight Bearing Restrictions: Yes LLE Weight Bearing: Weight bearing as tolerated    Mobility  Bed Mobility               General bed mobility comments: Pt in recliner upon PT entering the room.   Transfers Overall transfer level: Needs assistance Equipment used: Rolling walker (2 wheeled);Straight cane Transfers: Sit to/from Stand Sit to Stand: Supervision         General transfer comment: Patient demonstrates appropriate sequencing with both RW and SPC with no loss of balance noted on any attempt.   Ambulation/Gait Ambulation/Gait assistance: Supervision Ambulation Distance (Feet): 900 Feet (200' with SPC) Assistive device: Rolling walker (2  wheeled);Straight cane Gait Pattern/deviations: Step-through pattern   Gait velocity interpretation: Below normal speed for age/gender General Gait Details: Patient demonstrates no gait deficits in multiple laps of ambulation with RW. He is able to transfer weight roughly symmetrically. Pt switched to [facility] with cuing for sequencing, patient is able to ambulate 1 lap around RN station and multiple loops in the room with no loss of balance with SPC.    Stairs Stairs: Yes Stairs assistance: Supervision Stair Management: One rail Left Number of Stairs: 4 General stair comments: Patient demonstrates appropriate technique, with minor cuing for hand placement on descent. No loss of balance noted throughout, much improved from previous sessions.   Wheelchair Mobility    Modified Rankin (Stroke Patients Only)       Balance Overall balance assessment: Needs assistance   Sitting balance-Leahy Scale: Good Sitting balance - Comments: No balance deficits noted.    Standing balance support: Bilateral upper extremity supported;Single extremity supported Standing balance-Leahy Scale: Fair Standing balance comment: 5x sit to stand 11 seconds, no loss of balance during ambulation with either SPC or RW.                     Cognition Arousal/Alertness: Awake/alert Behavior During Therapy: WFL for tasks assessed/performed Overall Cognitive Status: Within Functional Limits for tasks assessed                      Exercises Total Joint Exercises Long Arc Quad: AROM;20 reps;Both Marching in Standing: AROM;Both;10 reps;Standing Other Exercises Other Exercises: 5x sit to stand with RW training, sit to stand training with SPC x 3 attempts.  Other Exercises: Standing heel raises x  10 with bilateral HHA  Other Exercises: Pt was able to verbalize 2/3 precautions - but wife could remember - did reinforce importance to obey precautions in functional taks if no paying attention     General  Comments        Pertinent Vitals/Pain Pain Assessment:  (Pt reports he is experiencing the expected surgical pain.) Pain Location: L hip Pain Intervention(s): Limited activity within patient's tolerance;Monitored during session    Home Living                      Prior Function            PT Goals (current goals can now be found in the care plan section) Acute Rehab PT Goals Patient Stated Goal: To go home PT Goal Formulation: With patient Time For Goal Achievement: 11/14/15 Potential to Achieve Goals: Good Progress towards PT goals: Progressing toward goals    Frequency  BID    PT Plan Current plan remains appropriate    Co-evaluation             End of Session Equipment Utilized During Treatment: Gait belt Activity Tolerance: Patient tolerated treatment well Patient left: in chair;with call bell/phone within reach     Time: 1358-1425 PT Time Calculation (min) (ACUTE ONLY): 27 min  Charges:  $Gait Training: 8-22 mins $Therapeutic Exercise: 8-22 mins                    G Codes:      Kerman Passey, PT, DPT    11/02/2015, 2:42 PM

## 2015-11-02 NOTE — Progress Notes (Signed)
Occupational Therapy Treatment Patient Details Name: MATVEY SANTILLANA MRN: [id number] DOB: [birth year] Today's Date: 11/02/2015    History of present illness Pt underwent L THR posterior approach and is POD#1at time of evaluation. Spoke with RN prior to evaluation who reports pt recently with significant bleeding from incision which was difficult to stop. Limited activity during PT evaluation due to recent bleeding at incision site   OT comments  Pt making progress in LB dressing, using AE - following precautions for hip Info provided on where to get AE  Wife present and reinforce importance of following precautions in functional mobility in ADL's and IADL's  No LOB during session - good safety   Follow Up Recommendations       Equipment Recommendations       Recommendations for Other Services      Precautions / Restrictions Precautions Precautions: Posterior Hip Precaution Booklet Issued: Yes (comment) Restrictions Weight Bearing Restrictions: Yes LLE Weight Bearing: Weight bearing as tolerated       Mobility Bed Mobility Overal bed mobility: Needs Assistance Bed Mobility: Supine to Sit     Supine to sit: Min guard     General bed mobility comments: Pt provided instruction for sequencing to transfer to sitting toward the L. Pt is able to demonstrate good sequencing and strength with L hip flexion and abduction. Use of bed rails. Pt does not violate hip precautions.  Transfers Overall transfer level: Needs assistance Equipment used: Rolling walker (2 wheeled) Transfers: Sit to/from Stand Sit to Stand: Min guard         General transfer comment: Pt demonstrates good sequencing and stability with transfer. Pt is able to scoot toward EOB and rise straight up to avoid violating hip flexion precaustions. Practiced transfer with BSC over toilet to simulate home environment    Balance Overall balance assessment: Needs assistance   Sitting balance-Leahy Scale: Good        Standing balance-Leahy Scale: Fair                     ADL                                                Vision                     Perception     Praxis      Cognition   Behavior During Therapy: WFL for tasks assessed/performed Overall Cognitive Status: Within Functional Limits for tasks assessed                       Extremity/Trunk Assessment               Exercises Total Joint Exercises Ankle Circles/Pumps: Strengthening;Both;15 reps;Supine Quad Sets: Strengthening;Both;15 reps;Supine Gluteal Sets: Strengthening;Both;15 reps;Supine Towel Squeeze: Strengthening;Both;15 reps;Supine Heel Slides: Strengthening;Left;15 reps;Supine Hip ABduction/ADduction: Strengthening;Left;15 reps;Supine (Performed in sitting x 15 bilateral) Straight Leg Raises: Strengthening;Left;15 reps;Supine Long Arc Quad: Strengthening;Left;15 reps;Seated Knee Flexion: Strengthening;Left;15 reps;Seated Marching in Standing: Strengthening;Both;15 reps;Seated Other Exercises Other Exercises: LB dressing using reacher doff socks min v/c, donn pants after demo with minv/c to use hand for R LE - reacher for L; Donn socks using sockaid  S with setup  Other Exercises: Standing pulling up pants 1 v/c - no LOB and good safety -  Other Exercises:  Pt was able to verbalize 2/3 precautions - but wife could remember - did reinforce importance to obey precautions in functional taks if no paying attention    Shoulder Instructions       General Comments      Pertinent Vitals/ Pain       Pain Assessment: 0-10 Pain Score: 5  Pain Location: L hip Pain Intervention(s): Monitored during session;Premedicated before session  Home Living                                          Prior Functioning/Environment              Frequency Min 1X/week     Progress Toward Goals  OT Goals(current goals can now be found in the care plan  section)  Progress towards OT goals: Progressing toward goals  Acute Rehab OT Goals Patient Stated Goal: To go home Time For Goal Achievement: 11/15/15 Potential to Achieve Goals: Good  Plan      Co-evaluation                 End of Session Equipment Utilized During Treatment: Gait belt;Rolling walker   Activity Tolerance Patient tolerated treatment well   Patient Left in chair;with call bell/phone within reach;with chair alarm set;with family/visitor present   Nurse Communication          Time: [id number] OT Time Calculation (min): 30 min  Charges: OT General Charges $OT Visit: 1 Procedure OT Treatments $Self Care/Home Management : 23-37 mins  Carlyann Placide OTR/L,CLT 11/02/2015, 11:03 AM

## 2015-11-02 NOTE — Care Management (Addendum)
This patient has [employer] which is not in-network with [facility] (or any other listed home health agency provided). [facility] [phone] fax [phone] will be the consulting agency to find a home health provider. They will need home health orders faxed to the above fax number and include ref # [id number]. Patient has been updated and given contact information for [facility]. Patient has requested a bedside commode and I am checking on that for him through [facility]. Rx needed.   Update: [facility] cannot provide bedside commode. However, I am checking with [facility].

## 2015-11-02 NOTE — Care Management (Signed)
Orders for home health PT and bedside commode faxed to [facility] [phone]. Planned discharge per Dr. Tamala Julian tomorrow.

## 2015-11-02 NOTE — Progress Notes (Signed)
Patient ID: Martin Deleon, male   DOB: Jul 06, 1956, 59 y.o.   MRN: [id number] Mental status good. Tolerating PT well. Moderate nausea. Discharge planned for tomorrow.

## 2015-11-03 LAB — GLUCOSE, CAPILLARY
GLUCOSE-CAPILLARY: 123 mg/dL — AB (ref 65–99)
GLUCOSE-CAPILLARY: 50 mg/dL — AB (ref 65–99)
GLUCOSE-CAPILLARY: 143 mg/dL — AB (ref 65–99)
GLUCOSE-CAPILLARY: 141 mg/dL — AB (ref 65–99)

## 2015-11-03 MED ORDER — ASPIRIN EC 325 MG PO TBEC: DELAYED_RELEASE_TABLET | ORAL | Status: DC

## 2015-11-03 MED ORDER — HYDROMORPHONE HCL 2 MG PO TABS: 2.0000 mg | ORAL_TABLET | ORAL | Status: DC | PRN

## 2015-11-03 NOTE — Care Management (Signed)
[facility] will deliver bedside commode to home address. [facility] will provide home health services per [facility]. No further RNCM needs. Case closed.

## 2015-11-03 NOTE — Final Progress Note (Signed)
Doing well. No nausea. Discharge today with home health PT.

## 2015-11-03 NOTE — Progress Notes (Signed)
PT Cancellation Note  Patient Details Name: Martin Deleon MRN: [id number] DOB: 10-08-1956   Cancelled Treatment:     Patient has discharge orders home and has been supervision level assist or mod I for all mobility and transfers up to this point. Patient declines any treatment this session, continue to recommend outpatient PT services.   Kerman Passey, PT, DPT    11/03/2015, 11:53 AM

## 2015-11-03 NOTE — Progress Notes (Signed)
Pt gave self lovenoz injection , pt for discharge , discharge instructions and perscriptions given to pt , pt discharged care of wife

## 2015-11-06 NOTE — Discharge Summary (Signed)
NAMEANDERS, COFFEE NO.:  [id number]  MEDICAL RECORD NO.:  [id number]  LOCATION:  138A                         FACILITY:  [facility]  PHYSICIAN:  Margaretmary Eddy, MD        DATE OF BIRTH:  04-Feb-1956  DATE OF ADMISSION:  10/31/2015 DATE OF DISCHARGE:  11/03/2015                              DISCHARGE SUMMARY   DISCHARGE DIAGNOSES: 1. Severe primary osteoarthritis, left hip. 2. Diabetes mellitus. 3. 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 4. Insulin pump in place.  OPERATIONS AND PROCEDURES PERFORMED:  Left total hip arthroplasty on October 31, 2015.  HISTORY AND PHYSICAL EXAMINATION:  Is as inserted into the medical record as a paper document.  LABORATORY DATA:  Is as noted in the chart.  COURSE IN HOSPITAL:  Following full laboratory workup and cardiac clearance, the patient was taken to the operating room on October 31, 2015.  Left total hip arthroplasty was performed with a DePuy AML component system.  The patient tolerated the procedure quite well. Postoperatively, he did have some bleeding through his dressing but this was quickly changed and secured.  He was advanced up into physical therapy with ambulation, full weight bearing.  He did have some postoperative nausea and vomiting.  Postoperative hematocrit remained stable at 31.  He progressed very well on physical therapy, and at the time of discharge on November 03, 2015, he was walking with a cane only. He is discharged on all of his previous medications except for naproxen and 81 mg of aspirin as prophylaxis for venous thrombosis and pulmonary embolism.  He was advised to take enteric-coated aspirin 325 mg twice a day with food.  He also was given a prescription for Dilaudid 2 mg every 6 hours as necessary for pain.  He is to change his dressing as necessary.  Home health physical therapy has been arranged.  He is to return to the office in approximately 12 days to see Dr. Tamala Julian in the office for  probable staple removal.          ______________________________ Margaretmary Eddy, MD     CS/MEDQ  D:  11/06/2015  T:  11/06/2015  Job:  [id number]

## 2016-12-26 ENCOUNTER — Encounter: Payer: Self-pay | Admitting: Primary Care

## 2016-12-26 ENCOUNTER — Encounter (INDEPENDENT_AMBULATORY_CARE_PROVIDER_SITE_OTHER): Payer: Self-pay

## 2016-12-26 ENCOUNTER — Ambulatory Visit (INDEPENDENT_AMBULATORY_CARE_PROVIDER_SITE_OTHER): Payer: Managed Care, Other (non HMO) | Admitting: Primary Care

## 2016-12-26 VITALS — BP 124/84 | HR 74 | Temp 98.3°F | Ht 70.0 in | Wt 167.4 lb

## 2016-12-26 DIAGNOSIS — E1040 Type 1 diabetes mellitus with diabetic neuropathy, unspecified: Secondary | ICD-10-CM

## 2016-12-26 DIAGNOSIS — G43909 Migraine, unspecified, not intractable, without status migrainosus: Secondary | ICD-10-CM | POA: Insufficient documentation

## 2016-12-26 DIAGNOSIS — R300 Dysuria: Secondary | ICD-10-CM | POA: Diagnosis not present

## 2016-12-26 DIAGNOSIS — R079 Chest pain, unspecified: Secondary | ICD-10-CM | POA: Insufficient documentation

## 2016-12-26 DIAGNOSIS — E109 Type 1 diabetes mellitus without complications: Secondary | ICD-10-CM | POA: Insufficient documentation

## 2016-12-26 DIAGNOSIS — M150 Primary generalized (osteo)arthritis: Secondary | ICD-10-CM

## 2016-12-26 DIAGNOSIS — R3912 Poor urinary stream: Secondary | ICD-10-CM

## 2016-12-26 DIAGNOSIS — E785 Hyperlipidemia, unspecified: Secondary | ICD-10-CM | POA: Diagnosis not present

## 2016-12-26 DIAGNOSIS — Z23 Encounter for immunization: Secondary | ICD-10-CM

## 2016-12-26 DIAGNOSIS — I10 Essential (primary) hypertension: Secondary | ICD-10-CM

## 2016-12-26 DIAGNOSIS — G43701 Chronic migraine without aura, not intractable, with status migrainosus: Secondary | ICD-10-CM

## 2016-12-26 DIAGNOSIS — R0789 Other chest pain: Secondary | ICD-10-CM

## 2016-12-26 DIAGNOSIS — M159 Polyosteoarthritis, unspecified: Secondary | ICD-10-CM

## 2016-12-26 LAB — POC URINALSYSI DIPSTICK (AUTOMATED)
Bilirubin, UA: NEGATIVE
GLUCOSE UA: NEGATIVE
KETONES UA: NEGATIVE
Leukocytes, UA: NEGATIVE
Nitrite, UA: NEGATIVE
Protein, UA: NEGATIVE
RBC UA: NEGATIVE
SPEC GRAV UA: 1.02
UROBILINOGEN UA: NEGATIVE
pH, UA: 6

## 2016-12-26 LAB — COMPREHENSIVE METABOLIC PANEL
ALT: 17 U/L (ref 0–53)
AST: 18 U/L (ref 0–37)
Albumin: 4.5 g/dL (ref 3.5–5.2)
Alkaline Phosphatase: 71 U/L (ref 39–117)
BUN: 13 mg/dL (ref 6–23)
CHLORIDE: 102 meq/L (ref 96–112)
CO2: 30 meq/L (ref 19–32)
Calcium: 9.7 mg/dL (ref 8.4–10.5)
Creatinine, Ser: 0.83 mg/dL (ref 0.40–1.50)
GFR: 100.7 mL/min (ref >60.00)
Glucose, Bld: 124 mg/dL — ABNORMAL HIGH (ref 70–99)
Potassium: 4.7 mEq/L (ref 3.5–5.1)
SODIUM: 140 meq/L (ref 135–145)
Total Bilirubin: 0.6 mg/dL (ref 0.2–1.2)
Total Protein: 6.5 g/dL (ref 6.0–8.3)

## 2016-12-26 LAB — LIPID PANEL
CHOL/HDL RATIO: 3
Cholesterol: 133 mg/dL (ref 0–200)
HDL: 49.8 mg/dL (ref >39.00)
LDL CALC: 73 mg/dL (ref 0–99)
NonHDL: 83.13
TRIGLYCERIDES: 53 mg/dL (ref 0.0–149.0)
VLDL: 10.6 mg/dL (ref 0.0–40.0)

## 2016-12-26 LAB — PSA: PSA: 2.12 ng/mL (ref 0.10–4.00)

## 2016-12-26 NOTE — Assessment & Plan Note (Signed)
Stable in the office today, continue lisinopril. CMP pending.

## 2016-12-26 NOTE — Assessment & Plan Note (Signed)
History of angina for years. Completed full workup including 2 cardiac catheterizations which were negative. Does experience chest pain weekly, no changes in pain. Discussed warning precautions for chest pain.

## 2016-12-26 NOTE — Progress Notes (Signed)
Pre visit review using our clinic review tool, if applicable. No additional management support is needed unless otherwise documented below in the visit note. 

## 2016-12-26 NOTE — Assessment & Plan Note (Addendum)
Following with neurology. Continue migraine cocktail as needed for migraines.

## 2016-12-26 NOTE — Assessment & Plan Note (Signed)
Lipid panel and LFTs pending today. Continue simvastatin 40 mg.

## 2016-12-26 NOTE — Assessment & Plan Note (Signed)
Present to neck, hips, hands predominantly. Moderate information to joints of hands bilaterally. Referral placed for rheumatology for further evaluation. Discussed that I do not prescribe narcotic medications, offered when necessary tramadol. He will think about the options.

## 2016-12-26 NOTE — Assessment & Plan Note (Signed)
Currently following with endocrinology through [facility]. He will do some research and notify me when he is ready for a local endocrinologist.

## 2016-12-26 NOTE — Progress Notes (Signed)
Subjective:    Patient ID: Martin Deleon, male    DOB: 12/03/1956, 61 y.o.   MRN: [id number]  HPI  Mr. Martin Deleon is a 61 year old male who presents today to establish care and discuss the problems mentioned below. Will obtain old records.  1) Type 1 Diabetes: Currently following with endocrinology through [facility]. He is managed on an insulin pump for treatment. He is considering finding an endocrinologist more local.  2) Hyperlipidemia: Currently managed on simvastatin 40 mg. His last lipid panel was normal in 01/2016. He denies myalgias.  3) Essential Hypertension: Diagnosed years ago. Currently managed on lisinopril 20 mg. He checks his BP at home which runs 120/70.   4) Arthritis/Diabetic Neuropathy: History of left total hip arthroplasty in 2016, cervical fusion in 2005 and 2006. Currently managed on gabapentin 800 mg three times daily. He has noticed swelling to the joints of his hands bilaterally for the past 6-8 months. He was previously seen by a rheumatologist and diagnosed with osteoarthritis. He's losing the ability to use his hands such as twisting jars and opening doors. He's does experience pain daily without improvement on Aleve, tylenol, and aspirin. Most of the time he pushes to the pain. He was once on Norco intermittently but this was discontinued years ago.  5) Chronic Headaches/Migraines: He is currently managed per Neurology and takes Tizanidine, Lorazepam, and promethazine. He follows with his neurologist annually. He uses the Maxalt four times monthly on average. Overall migraines have improved significantly.  6) Angina: Experiences chest pain frequently which feels the same. History of two cardiac catheterizatoions that have been clear, also full cardiac workup which was unremarkable. He experiences chest pain several times weekly. He denies changes in the way his chest pain feels, nausea, diaphoresis.  7) Weak Urinary Stream: Also with difficulty urinating and impotence.  This has been ongoing for the last 1 year. He denies hematuria and penile discharge. He's also noticed dysuria for the past 6 weeks.  Review of Systems  Constitutional: Negative for unexpected weight change.  Eyes: Negative for visual disturbance.  Respiratory: Negative for shortness of breath.   Cardiovascular:       Intermittent chest pain   Gastrointestinal: Negative for abdominal pain.  Genitourinary: Positive for difficulty urinating, dysuria and frequency. Negative for discharge, flank pain and hematuria.  Musculoskeletal: Positive for arthralgias.  Skin:       Slow healing sores on hands  Neurological: Negative for dizziness.  Psychiatric/Behavioral: The patient is not nervous/anxious.        Past Medical History:  Diagnosis Date  . Anxiety   . Arthritis   . Diabetes mellitus without complication ([place])   . Headache   . Insulin pump in place   . Neuropathy due to secondary diabetes mellitus ([place])   . PONV (postoperative nausea and vomiting)      Social History   Social History  . Marital status: Married    Spouse name: N/A  . Number of children: N/A  . Years of education: N/A   Occupational History  . Not on file.   Social History Main Topics  . Smoking status: Former Smoker    Packs/day: 1.00    Types: Cigarettes    Quit date: 03/16/1997  . Smokeless tobacco: Never Used  . Alcohol use No  . Drug use: No  . Sexual activity: Not on file   Other Topics Concern  . Not on file   Social History Narrative   Married.  2 children.   Works as a Nature conservation officer.   Enjoys golfing.     Past Surgical History:  Procedure Laterality Date  . APPENDECTOMY    . CARDIAC CATHETERIZATION    . CERVICAL FUSION  2005, 2006   [facility] Dr. Glenna Fellows  . FRACTURE SURGERY Right August 05, 1986   [place]  . TOTAL HIP ARTHROPLASTY Left 10/31/2015   Procedure: TOTAL HIP ARTHROPLASTY;  Surgeon: Christophe Louis, MD;  Location: [facility] ORS;  Service: Orthopedics;  Laterality:  Left;    Family History  Problem Relation Age of Onset  . Heart disease Father   . Heart attack Father 61  . Penile cancer Father   . Arrhythmia Brother     No Known Allergies  Current Outpatient Prescriptions on File Prior to Visit  Medication Sig Dispense Refill  . gabapentin (NEURONTIN) 800 MG tablet Take 800 mg by mouth 3 (three) times daily.    . Insulin Human (INSULIN PUMP) SOLN Inject into the skin every hour. Humalog Insulin Pump--patient stated he has five different rates depending on the time of day    . lisinopril (PRINIVIL,ZESTRIL) 20 MG tablet Take 20 mg by mouth daily.    Marland Kitchen LORazepam (ATIVAN) 1 MG tablet Take 1 mg by mouth every 6 (six) hours as needed for anxiety.    . promethazine (PHENERGAN) 25 MG tablet Take 25 mg by mouth every 6 (six) hours as needed for nausea or vomiting.    . rizatriptan (MAXALT) 10 MG tablet Take 10 mg by mouth as needed for migraine. May repeat in 2 hours if needed    . simvastatin (ZOCOR) 40 MG tablet Take 40 mg by mouth daily at 6 PM.     . tiZANidine (ZANAFLEX) 4 MG tablet Take 4 mg by mouth 3 (three) times daily as needed for muscle spasms.      No current facility-administered medications on file prior to visit.     BP 124/84   Pulse 74   Temp 98.3 F (36.8 C) (Oral)   Ht 5\' 10"  (1.778 m)   Wt 167 lb 6.4 oz (75.9 kg)   SpO2 97%   BMI 24.02 kg/m    Objective:   Physical Exam  Constitutional: He is oriented to person, place, and time. He appears well-nourished.  Neck: Neck supple.  Cardiovascular: Normal rate and regular rhythm.   Pulmonary/Chest: Effort normal and breath sounds normal. He has no wheezes. He has no rales.  Abdominal: Soft.  Musculoskeletal:  Moderate inflammation to joints of digits on bilateral hands. Decreased range of motion to the digits, hands bilaterally.  Neurological: He is alert and oriented to person, place, and time.  Skin: Skin is warm and dry.  Psychiatric: He has a normal mood and affect.           Assessment & Plan:

## 2016-12-26 NOTE — Assessment & Plan Note (Signed)
Ongoing 1 year. UA today negative. PSA pending. Likely BPH, will consider Flomax.

## 2016-12-26 NOTE — Patient Instructions (Addendum)
Please notify me once you've decided on a local Endocrinologist.  Complete lab work prior to leaving today.   Stop by the front desk and speak with either Rosaria Ferries regarding your referral to Rheumatology.   I will be in touch with you once I receive your lab results.  Follow up in 6 months for re-evaluation or sooner if needed.  It was a pleasure to meet you today! Please don't hesitate to call me with any questions. Welcome to [facility]!

## 2017-04-07 ENCOUNTER — Ambulatory Visit (INDEPENDENT_AMBULATORY_CARE_PROVIDER_SITE_OTHER): Payer: Managed Care, Other (non HMO) | Admitting: Primary Care

## 2017-04-07 ENCOUNTER — Encounter: Payer: Self-pay | Admitting: Primary Care

## 2017-04-07 VITALS — BP 168/92 | HR 74 | Temp 97.9°F | Ht 70.0 in | Wt 166.4 lb

## 2017-04-07 DIAGNOSIS — M62838 Other muscle spasm: Secondary | ICD-10-CM

## 2017-04-07 MED ORDER — KETOROLAC TROMETHAMINE 60 MG/2ML IM SOLN
60.0000 mg | Freq: Once | INTRAMUSCULAR | Status: AC
  Administered 2017-04-07: 60 mg via INTRAMUSCULAR

## 2017-04-07 MED ORDER — IBUPROFEN 800 MG PO TABS: 800.0000 mg | ORAL_TABLET | Freq: Three times a day (TID) | ORAL | 0 refills | Status: DC | PRN

## 2017-04-07 MED ORDER — METHOCARBAMOL 500 MG PO TABS: 500.0000 mg | ORAL_TABLET | Freq: Four times a day (QID) | ORAL | 0 refills | Status: DC | PRN

## 2017-04-07 NOTE — Patient Instructions (Signed)
Start methocarbamol 500 mg tablets for muscle spasms. Take 1 tablet by mouth every 6 hours as needed for muscle spasms. Caution as this may cause drowsiness.   Start Ibuprofen 800 mg tablets and take every 8 hours as needed for pain. Do not take any Ibuprofen until bedtime tonight.  Continue application of heating pads.   Try to stretch your neck when possible.   It was a pleasure to see you today!   Acute Torticollis Torticollis is a condition in which the muscles of the neck tighten (contract) abnormally, causing the neck to twist and the head to move into an unnatural position. Torticollis that develops suddenly is called acute torticollis. If torticollis becomes chronic and is left untreated, the face and neck can become deformed. CAUSES This condition may be caused by:  Sleeping in an awkward position (common).  Extending or twisting the neck muscles beyond their normal position.  Infection. In some cases, the cause may not be known. SYMPTOMS Symptoms of this condition include:  An unnatural position of the head.  Neck pain.  A limited ability to move the neck.  Twisting of the neck to one side. DIAGNOSIS This condition is diagnosed with a physical exam. You may also have imaging tests, such as an X-ray, CT scan, or MRI. TREATMENT Treatment for this condition involves trying to relax the neck muscles. It may include:  Medicines or shots.  Physical therapy.  Surgery. This may be done in severe cases. HOME CARE INSTRUCTIONS  Take medicines only as directed by your health care provider.  Do stretching exercises and massage your neck as directed by your health care provider.  Keep all follow-up visits as directed by your health care provider. This is important. SEEK MEDICAL CARE IF:  You develop a fever. SEEK IMMEDIATE MEDICAL CARE IF:  You develop difficulty breathing.  You develop noisy breathing (stridor).  You start drooling.  You have trouble  swallowing or have pain with swallowing.  You develop numbness or weakness in your hands or feet.  You have changes in your speech, understanding, or vision.  Your pain gets worse. This information is not intended to replace advice given to you by your health care provider. Make sure you discuss any questions you have with your health care provider. Document Released: 11/29/2000 Document Revised: 03/25/2016 Document Reviewed: 11/28/2014 Elsevier Interactive Patient Education  2017 [employer].

## 2017-04-07 NOTE — Progress Notes (Signed)
Pre visit review using our clinic review tool, if applicable. No additional management support is needed unless otherwise documented below in the visit note. 

## 2017-04-07 NOTE — Addendum Note (Signed)
Addended by: Jacqualin Combes on: 04/07/2017 10:13 AM   Modules accepted: Orders

## 2017-04-07 NOTE — Progress Notes (Signed)
Subjective:    Patient ID: Martin Deleon, male    DOB: Oct 22, 1956, 61 y.o.   MRN: [id number]  HPI  Mr. Mcenroe is a 61 year old male who presents today with a chief complaint of neck muscle spasm/pain. His spasm/pain began to the right lower neck/upper back that began about 10 days ago. He was working in the yard this past weekend and noticed increased spasms, stiffness, and pain. He's tried heating pads, cool compresses, taking Tizanidine 4 mg and Lorazepam without any improvement. He denies numbness/tingling, injury/trauma, changes in vision.  Review of Systems  Musculoskeletal: Positive for back pain, myalgias, neck pain and neck stiffness.  Neurological: Negative for weakness and numbness.       Past Medical History:  Diagnosis Date  . Anxiety   . Arthritis   . Diabetes mellitus without complication ([place])   . Headache   . Insulin pump in place   . Neuropathy due to secondary diabetes mellitus ([place])   . PONV (postoperative nausea and vomiting)      Social History   Social History  . Marital status: Married    Spouse name: N/A  . Number of children: N/A  . Years of education: N/A   Occupational History  . Not on file.   Social History Main Topics  . Smoking status: Former Smoker    Packs/day: 1.00    Types: Cigarettes    Quit date: 03/16/1997  . Smokeless tobacco: Never Used  . Alcohol use No  . Drug use: No  . Sexual activity: Not on file   Other Topics Concern  . Not on file   Social History Narrative   Married.   2 children.   Works as a Nature conservation officer.   Enjoys golfing.     Past Surgical History:  Procedure Laterality Date  . APPENDECTOMY    . CARDIAC CATHETERIZATION    . CERVICAL FUSION  2005, 2006   [facility] Dr. Glenna Fellows  . FRACTURE SURGERY Right August 05, 1986   [place]  . TOTAL HIP ARTHROPLASTY Left 10/31/2015   Procedure: TOTAL HIP ARTHROPLASTY;  Surgeon: Christophe Louis, MD;  Location: [facility] ORS;  Service: Orthopedics;  Laterality:  Left;    Family History  Problem Relation Age of Onset  . Heart disease Father   . Heart attack Father 23  . Penile cancer Father   . Arrhythmia Brother     No Known Allergies  Current Outpatient Prescriptions on File Prior to Visit  Medication Sig Dispense Refill  . aspirin EC 81 MG tablet Take 81 mg by mouth daily.    Marland Kitchen gabapentin (NEURONTIN) 800 MG tablet Take 800 mg by mouth 3 (three) times daily.    . Insulin Human (INSULIN PUMP) SOLN Inject into the skin every hour. Humalog Insulin Pump--patient stated he has five different rates depending on the time of day    . lisinopril (PRINIVIL,ZESTRIL) 20 MG tablet Take 20 mg by mouth daily.    Marland Kitchen LORazepam (ATIVAN) 1 MG tablet Take 1 mg by mouth every 6 (six) hours as needed for anxiety.    . promethazine (PHENERGAN) 25 MG tablet Take 25 mg by mouth every 6 (six) hours as needed for nausea or vomiting.    . rizatriptan (MAXALT) 10 MG tablet Take 10 mg by mouth as needed for migraine. May repeat in 2 hours if needed    . simvastatin (ZOCOR) 40 MG tablet Take 40 mg by mouth daily at 6 PM.     .  tiZANidine (ZANAFLEX) 4 MG tablet Take 4 mg by mouth 3 (three) times daily as needed for muscle spasms.      No current facility-administered medications on file prior to visit.     BP (!) 168/92   Pulse 74   Temp 97.9 F (36.6 C) (Oral)   Ht 5\' 10"  (1.778 m)   Wt 166 lb 6.4 oz (75.5 kg)   SpO2 97%   BMI 23.88 kg/m    Objective:   Physical Exam  Neck: Muscular tenderness present. No spinous process tenderness present. Decreased range of motion present.  Moderate decrease in ROM in all planes. Muscle tenderness to posterior lower neck and right upper  Trapezius.  Cardiovascular: Normal rate and regular rhythm.   Pulmonary/Chest: Effort normal and breath sounds normal.  Skin: Skin is warm and dry.          Assessment & Plan:  Muscle Spasm of Neck:  Present x 10 days, worse since working in the yard 2 days ago. No improvement  with current regimen. Exam today with moderate decrease in ROM and pain, representative of MSK involvement. No neuro involvement.  Injection of IM Toradol provided today. Rx for methocarbamol and Ibuprofen sent to pharmacy. Discussed use of heating pads, stretching, medications. Follow up PRN.  Sheral Flow, NP

## 2017-04-10 ENCOUNTER — Telehealth: Payer: Self-pay

## 2017-04-10 DIAGNOSIS — M542 Cervicalgia: Secondary | ICD-10-CM

## 2017-04-10 MED ORDER — PREDNISONE 20 MG PO TABS: ORAL_TABLET | ORAL | 0 refills | Status: DC

## 2017-04-10 NOTE — Telephone Encounter (Signed)
Pt left v/m; pt seen 04/07/17 with stiff neck and pain; pt taking meds as instructed. Pt not able to work; pt has hx of cervical issues. Pt is worse than when seen on 04/07/17. Pt request cb with what to do next.

## 2017-04-10 NOTE — Telephone Encounter (Signed)
Please notify patient that I sent in a prescription of prednisone (steroids) to take for his symptoms. Take 3 tablets for 2 days, then 2 tablets for 3 days, then 1 tablet for 3 days. He is to refrain from consumption of Ibuprofen, Motrin, Advil, Aleve, naproxen while on prednisone. If no improvement or if his pain continues to become worse over the weekend then he will need to go to the hospital.

## 2017-04-10 NOTE — Telephone Encounter (Signed)
Spoken and notified patient of Kate's comments. Patient verbalized understanding. 

## 2017-04-14 NOTE — Telephone Encounter (Signed)
Message left for patient to return my call.  

## 2017-04-14 NOTE — Telephone Encounter (Signed)
I think it would be best for him to see our sports medicine doctor, Dr. Lorelei Pont, this week for further evaluation. Please schedule him at his convenience.

## 2017-04-14 NOTE — Telephone Encounter (Signed)
Pt left v/m; pt seen 04/07/17;pt has taken muscle relaxant and prednisone; cannot see a lot of improvement and pt request referral to surgeon but surgeons office advised needs to have MRI or CT prior to referral. Pt request cb.

## 2017-04-14 NOTE — Telephone Encounter (Signed)
Patient returned Chan's call.  He scheduled appointment with Dr.Copland on 04/16/17 at 9:30.

## 2017-04-16 ENCOUNTER — Ambulatory Visit (INDEPENDENT_AMBULATORY_CARE_PROVIDER_SITE_OTHER)
Admission: RE | Admit: 2017-04-16 | Discharge: 2017-04-16 | Disposition: A | Discharge status: Home / Self Care | Payer: Managed Care, Other (non HMO) | Source: Ambulatory Visit | Attending: Family Medicine | Admitting: Family Medicine

## 2017-04-16 ENCOUNTER — Encounter: Payer: Self-pay | Admitting: Family Medicine

## 2017-04-16 ENCOUNTER — Ambulatory Visit (INDEPENDENT_AMBULATORY_CARE_PROVIDER_SITE_OTHER): Payer: Managed Care, Other (non HMO) | Admitting: Family Medicine

## 2017-04-16 ENCOUNTER — Other Ambulatory Visit: Payer: Self-pay | Admitting: Family Medicine

## 2017-04-16 VITALS — BP 150/80 | HR 78 | Temp 98.0°F | Ht 70.0 in | Wt 166.8 lb

## 2017-04-16 DIAGNOSIS — M542 Cervicalgia: Secondary | ICD-10-CM

## 2017-04-16 DIAGNOSIS — M5020 Other cervical disc displacement, unspecified cervical region: Secondary | ICD-10-CM

## 2017-04-16 DIAGNOSIS — Z981 Arthrodesis status: Secondary | ICD-10-CM

## 2017-04-16 LAB — BASIC METABOLIC PANEL
BUN: 10 mg/dL (ref 6–23)
CHLORIDE: 102 meq/L (ref 96–112)
CO2: 30 meq/L (ref 19–32)
Calcium: 9.5 mg/dL (ref 8.4–10.5)
Creatinine, Ser: 0.85 mg/dL (ref 0.40–1.50)
GFR: 97.87 mL/min (ref >60.00)
Glucose, Bld: 145 mg/dL — ABNORMAL HIGH (ref 70–99)
POTASSIUM: 3.9 meq/L (ref 3.5–5.1)
Sodium: 138 mEq/L (ref 135–145)

## 2017-04-16 NOTE — Progress Notes (Signed)
Pre visit review using our clinic review tool, if applicable. No additional management support is needed unless otherwise documented below in the visit note. 

## 2017-04-16 NOTE — Progress Notes (Addendum)
Dr. Frederico Hamman T. Lisette Mancebo, MD, [facility] [place], [address] Phone: [phone] Fax: [phone]  04/16/2017  Patient: Martin Deleon, MRN: [id number], DOB: 27-Mar-1956, 61 y.o.  Primary Physician:  Sheral Flow, NP   Chief Complaint  Patient presents with  . Neck Pain    since 04/05/17 Been on Muslce relaxants and Ibuprofen   Subjective:   Martin Deleon is a 61 y.o. very pleasant male patient who presents with the following:  New onset acute, severe pain in a patient with 4 prior cervical spine surgeries, multilevel fusion from C5-T1, beginning on 04/05/2017. Thus far he has Failed all conservative measures.  Having a lot of spasms.  No new numbness or tingling.  Almost a continuous pressure.  Started off on the R side.   Woke up on the 4/22 and severe spasm and pain since then.  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  He is tried to go to work, and he has been unable able to complete his job activities. He has gone 3 different times, and he has been able to do a minimal amount of work each time.  Dr. Carloyn Manner did surgeries on neck. Has had 4 surgeries on his neck. Dr. Carloyn Manner did all of them.  Had occipital neuralgia surgery distantly.   Ibuprofen, prednisone, robaxin, and gabapentin.   Past Medical History, Surgical History, Social History, Family History, Problem List, Medications, and Allergies have been reviewed and updated if relevant.  Patient Active Problem List   Diagnosis Date Noted  . 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 Weak urinary stream 12/26/2016  . Hyperlipidemia 12/26/2016  . Essential hypertension 12/26/2016  . Migraines 12/26/2016  . Chest pain 12/26/2016  . Type 1 diabetes ([place]) 12/26/2016    Past Medical History:  Diagnosis Date  . Anxiety   . Arthritis   . Diabetes mellitus without complication ([place])   . Headache   .  Insulin pump in place   . Neuropathy due to secondary diabetes mellitus ([place])   . PONV (postoperative nausea and vomiting)     Past Surgical History:  Procedure Laterality Date  . APPENDECTOMY    . CARDIAC CATHETERIZATION    . CERVICAL FUSION  2005, 2006   [facility] Dr. Glenna Fellows  . FRACTURE SURGERY Right August 05, 1986   [place]  . TOTAL HIP ARTHROPLASTY Left 10/31/2015   Procedure: TOTAL HIP ARTHROPLASTY;  Surgeon: Christophe Louis, MD;  Location: [facility] ORS;  Service: Orthopedics;  Laterality: Left;    Social History   Social History  . Marital status: Married    Spouse name: N/A  . Number of children: N/A  . Years of education: N/A   Occupational History  . Not on file.   Social History Main Topics  . Smoking status: Former Smoker    Packs/day: 1.00    Types: Cigarettes    Quit date: 03/16/1997  . Smokeless tobacco: Never Used  . Alcohol use No  . Drug use: No  . Sexual activity: Not on file   Other Topics Concern  . Not on file   Social History Narrative   Married.   2 children.   Works as a Nature conservation officer.   Enjoys golfing.     Family History  Problem Relation Age of Onset  . Heart disease Father   . Heart attack Father 70  . Penile cancer Father   .  Arrhythmia Brother     No Known Allergies  Medication list reviewed and updated in full in [place].  GEN: no acute illness or fever CV: No chest pain or shortness of breath MSK: detailed above Neuro: neurological signs are described above ROS O/w per HPI  Objective:   BP (!) 150/80   Pulse 78   Temp 98 F (36.7 C) (Oral)   Ht 5\' 10"  (1.778 m)   Wt 166 lb 12 oz (75.6 kg)   BMI 23.93 kg/m    GEN: Well-developed,well-nourished,in no acute distress; alert,appropriate and cooperative throughout examination HEENT: Normocephalic and atraumatic without obvious abnormalities. Ears, externally no deformities PULM: Breathing comfortably in no respiratory distress EXT: No clubbing, cyanosis,  or edema PSYCH: Normally interactive. Cooperative during the interview. Pleasant. Friendly and conversant. Not anxious or depressed appearing. Normal, full affect.  CERVICAL SPINE EXAM Range of motion: Flexion, extension, lateral bending, and rotation: Markedly abnormal range of motion. Forward flexion decreased by approximately 40%. Extension decreased by approximately 60%. Lateral bending decreased by approximately 75-80%. Pain with terminal motion: Yes Spinous Processes: TTP SCM: NT Upper paracervical muscles: Diffusely tender in their entirety Upper traps: Upper traps are entirely tender C5-T1 intact, sensation and motor   Radiology: Dg Cervical Spine Complete  Result Date: 04/16/2017 CLINICAL DATA:  Prior cervical spine surgery.  Acute pain. EXAM: CERVICAL SPINE - COMPLETE 4+ VIEW COMPARISON:  CT 02/18/2012.  Cervical spine 10/16/2011. FINDINGS: C5 through T1 posterior and interbody fusion. Hardware intact. Anatomic alignment. Diffuse degenerative change. No acute bony abnormality. Neuroforamen patent. Bilateral carotid atherosclerotic vascular calcification. IMPRESSION: 1. C5 through T1 posterior interbody fusion. Hardware intact. Anatomic alignment. 2. Diffuse degenerative change.  No acute bony abnormality. 3. Bilateral carotid atherosclerotic vascular disease . Electronically Signed   By: Marcello Moores  Register   On: 04/16/2017 11:22    Mr Cervical Spine W Wo Contrast  Result Date: 04/17/2017 CLINICAL DATA:  Left-sided neck pain for 2 weeks EXAM: MRI CERVICAL SPINE WITHOUT AND WITH CONTRAST TECHNIQUE: Multiplanar and multiecho pulse sequences of the cervical spine, to include the craniocervical junction and cervicothoracic junction, were obtained without and with intravenous contrast. CONTRAST:  5mL MULTIHANCE GADOBENATE DIMEGLUMINE 529 MG/ML IV SOLN COMPARISON:  None. FINDINGS: Alignment: Physiologic. Vertebrae: No fracture, evidence of discitis, or bone lesion. Cord: Normal signal and  morphology. Posterior Fossa, vertebral arteries, paraspinal tissues: Postsurgical changes in the posterior paraspinal soft tissues. Posterior fossa is normal. Vertebral artery flow voids are maintained. Disc levels: Discs: Anterior cervical at C5-6, C6-7 and C7-T1 with solid osseous bridging across the disc spaces. Posterior spinal fusion from C5 through T1. C2-3: No significant disc bulge. No neural foraminal stenosis. No central canal stenosis. C3-4: No significant disc bulge. No foraminal narrowing. No central canal stenosis. C4-5: Small central disc protrusion abutting the ventral cervical spinal cord. Mild bilateral foraminal narrowing. Mild central canal stenosis. Mild bilateral facet arthropathy. C5-6: Interbody fusion. No neural foraminal stenosis. No central canal stenosis. C6-7: Interbody fusion. No neural foraminal stenosis. No central canal stenosis. C7-T1: Interbody fusion. No neural foraminal stenosis. No central canal stenosis. IMPRESSION: 1. Anterior and posterior cervical fusion from C5 through T1 without recurrent foraminal or central canal stenosis. 2. At C4-5 there is a small central disc protrusion abutting the ventral cervical spinal cord. Mild bilateral foraminal stenosis. Mild bilateral facet arthropathy. Mild central canal stenosis. Electronically Signed   By: Kathreen Devoid   On: 04/17/2017 11:33    Assessment and Plan:   Acute neck pain -  Plan: DG Cervical Spine Complete, MR CERVICAL SPINE W WO CONTRAST, Basic metabolic panel  History of fusion of cervical spine - Plan: DG Cervical Spine Complete, MR CERVICAL SPINE W WO CONTRAST, Basic metabolic panel  Complicated patient with 4 prior cervical spine surgeries. He is doing decidedly poorly and an acute pain. He is unable to complete his work tasks.  Obtain an MRI of the cervical spine with and without contrast to evaluate the patient's multiple levels of cervical spine fusion, evaluate any postsurgical changes which may guide cause  for severe pain and restriction of motion.  Unable to complete basic work tasks. I've given the patient a note for work, and he tells me that he will be completing his FMLA requests.  Addendum: the patient's MRI results have returned, I verbally discuss them with him.  He continues to do poorly, failing maximal medical management.  We're going to get some Norco No. 20 tablets available for him in the morning.  Given his overall state with his cervical spine, and what appears to be symptomatic changes just above his fusion, I'm going to have the patient see his neurosurgeon.  Orders Placed This Encounter  Procedures  . DG Cervical Spine Complete  . MR CERVICAL SPINE W WO CONTRAST  . Basic metabolic panel    Signed,  Frederico Hamman T. Edgar Reisz, MD   Allergies as of 04/16/2017   No Known Allergies     Medication List       Accurate as of 04/16/17  1:58 PM. Always use your most recent med list.          aspirin EC 81 MG tablet Take 81 mg by mouth daily.   gabapentin 800 MG tablet Commonly known as:  NEURONTIN Take 800 mg by mouth 3 (three) times daily.   ibuprofen 800 MG tablet Commonly known as:  ADVIL,MOTRIN Take 1 tablet (800 mg total) by mouth every 8 (eight) hours as needed for moderate pain.   insulin pump Soln Inject into the skin every hour. Humalog Insulin Pump--patient stated he has five different rates depending on the time of day   lisinopril 20 MG tablet Commonly known as:  PRINIVIL,ZESTRIL Take 20 mg by mouth daily.   LORazepam 1 MG tablet Commonly known as:  ATIVAN Take 1 mg by mouth every 6 (six) hours as needed for anxiety.   methocarbamol 500 MG tablet Commonly known as:  ROBAXIN Take 1 tablet (500 mg total) by mouth every 6 (six) hours as needed for muscle spasms.   predniSONE 20 MG tablet Commonly known as:  DELTASONE Take 3 tablets for 2 days, then 2 tablets for 3 days, then 1 tablet for 3 days.   promethazine 25 MG tablet Commonly known as:   PHENERGAN Take 25 mg by mouth every 6 (six) hours as needed for nausea or vomiting.   rizatriptan 10 MG tablet Commonly known as:  MAXALT Take 10 mg by mouth as needed for migraine. May repeat in 2 hours if needed   simvastatin 40 MG tablet Commonly known as:  ZOCOR Take 40 mg by mouth daily at 6 PM.   tiZANidine 4 MG tablet Commonly known as:  ZANAFLEX Take 4 mg by mouth 3 (three) times daily as needed for muscle spasms.

## 2017-04-17 ENCOUNTER — Ambulatory Visit
Admission: RE | Admit: 2017-04-17 | Discharge: 2017-04-17 | Disposition: A | Discharge status: Home / Self Care | Payer: Managed Care, Other (non HMO) | Source: Ambulatory Visit | Attending: Family Medicine | Admitting: Family Medicine

## 2017-04-17 ENCOUNTER — Telehealth: Payer: Self-pay | Admitting: Family Medicine

## 2017-04-17 DIAGNOSIS — Z981 Arthrodesis status: Secondary | ICD-10-CM

## 2017-04-17 DIAGNOSIS — M542 Cervicalgia: Secondary | ICD-10-CM

## 2017-04-17 MED ORDER — GADOBENATE DIMEGLUMINE 529 MG/ML IV SOLN
15.0000 mL | Freq: Once | INTRAVENOUS | Status: AC | PRN
  Administered 2017-04-17: 15 mL via INTRAVENOUS

## 2017-04-17 NOTE — Telephone Encounter (Signed)
We spoke on the telephone. I am going to have him see Dr. Carloyn Manner who did his prior 4 surgeries.   After speaking to him again, the patient continues to be in significant pain.  At this point he has felt taking oral NSAIDs, muscle relaxants, high-dose gabapentin, prednisone and continues to be in significant pain.  Given I will not be in the office tomorrow, I would like to asked my partner Dr. Diona Browner to help in this matter.  In the similar circumstances I typically would write for:  Norco 5-325, 1 p.o. q.4-6 hours as needed for pain.  Dispense #20, 0 ref

## 2017-04-17 NOTE — Addendum Note (Signed)
Addended by: Owens Loffler on: 04/17/2017 05:49 PM   Modules accepted: Orders

## 2017-04-17 NOTE — Telephone Encounter (Signed)
Patient had his MRI this am and wanted Dr Lorelei Pont to know. He is anxious to get his results. Please call patient.

## 2017-04-18 MED ORDER — HYDROCODONE-ACETAMINOPHEN 5-325 MG PO TABS: ORAL_TABLET | ORAL | 0 refills | Status: DC

## 2017-04-18 NOTE — Telephone Encounter (Signed)
Martin Deleon notified that his prescription is ready to be picked up at the front desk.

## 2017-04-18 NOTE — Telephone Encounter (Signed)
After review of pt chart I am agreeable to providing recommended prescription for acute pain in this pt as recommended by Dr. Lorelei Pont. Please print for me to sign.

## 2017-04-22 ENCOUNTER — Other Ambulatory Visit: Payer: Managed Care, Other (non HMO)

## 2017-04-23 ENCOUNTER — Telehealth: Payer: Self-pay | Admitting: Primary Care

## 2017-04-23 NOTE — Telephone Encounter (Signed)
FMLA paperwork completed and placed on Robin's desk. 

## 2017-04-23 NOTE — Telephone Encounter (Signed)
fmla paperwork in [employer] in box  °For review and signature °

## 2017-04-24 NOTE — Telephone Encounter (Signed)
Paperwork faxed Copy for pt Copy for file Copy for scan  Pt aware

## 2017-05-01 ENCOUNTER — Telehealth: Payer: Self-pay | Admitting: Primary Care

## 2017-05-01 NOTE — Telephone Encounter (Signed)
[employer] faxed additional paperwork In [employer] IN BOX

## 2017-05-06 NOTE — Telephone Encounter (Signed)
Form completed and placed on Robin's desk. 

## 2017-05-07 NOTE — Telephone Encounter (Signed)
Paperwork faxed 5/23

## 2017-05-07 NOTE — Telephone Encounter (Signed)
Left message letting pt know paperwork has been faxed °Copy for pt °Copy for file °Copy for scan  °

## 2017-05-13 ENCOUNTER — Telehealth: Payer: Self-pay | Admitting: Primary Care

## 2017-05-13 NOTE — Telephone Encounter (Signed)
Spoke with pt He stated his appointment with dr Carloyn Manner is 5/31 and wanted leave to end on 05/19/17 to give dr Carloyn Manner time to decide what pt needs.  Pt stated he will have dr Carloyn Manner do paperwork after he sees him Paperwork in [place] for review and signature

## 2017-05-13 NOTE — Telephone Encounter (Signed)
Paperwork faxed °

## 2017-05-13 NOTE — Telephone Encounter (Signed)
Noted, form completed and placed in on Robin's desk.

## 2017-05-13 NOTE — Telephone Encounter (Signed)
Left message asking pt to call office I have questions about fmla paperwork I received 5/29

## 2017-05-14 NOTE — Telephone Encounter (Signed)
Left message letting pt know paperwork had been faxed and a copy was here for him  Copy for file Copy for scan Copy for pt

## 2017-05-19 ENCOUNTER — Other Ambulatory Visit: Payer: Self-pay | Admitting: Neurosurgery

## 2017-05-19 DIAGNOSIS — M4722 Other spondylosis with radiculopathy, cervical region: Secondary | ICD-10-CM

## 2017-05-27 ENCOUNTER — Ambulatory Visit
Admission: RE | Admit: 2017-05-27 | Discharge: 2017-05-27 | Disposition: A | Discharge status: Home / Self Care | Payer: Managed Care, Other (non HMO) | Source: Ambulatory Visit | Attending: Neurosurgery | Admitting: Neurosurgery

## 2017-05-27 DIAGNOSIS — M4722 Other spondylosis with radiculopathy, cervical region: Secondary | ICD-10-CM

## 2017-05-27 MED ORDER — IOPAMIDOL (ISOVUE-M 300) INJECTION 61%
1.0000 mL | Freq: Once | INTRAMUSCULAR | Status: AC | PRN
  Administered 2017-05-27: 1 mL via EPIDURAL

## 2017-05-27 MED ORDER — TRIAMCINOLONE ACETONIDE 40 MG/ML IJ SUSP (RADIOLOGY)
60.0000 mg | Freq: Once | INTRAMUSCULAR | Status: AC
  Administered 2017-05-27: 60 mg via EPIDURAL

## 2017-05-27 NOTE — Discharge Instructions (Signed)

## 2017-06-26 ENCOUNTER — Ambulatory Visit: Payer: Managed Care, Other (non HMO) | Admitting: Primary Care

## 2017-06-26 ENCOUNTER — Telehealth: Payer: Self-pay | Admitting: Primary Care

## 2017-06-26 NOTE — Telephone Encounter (Signed)
Noted  

## 2017-06-26 NOTE — Telephone Encounter (Signed)
Patient called and said he missed his appointment this morning because he had a dental procedure yesterday and is on pain meds and he slept through the appointment.  Patient apologized for missing the appointment.  Patient said he would call back to reschedule the appointment.

## 2017-06-26 NOTE — Telephone Encounter (Signed)
Noted. I have already cancel the appointment.

## 2018-03-26 ENCOUNTER — Encounter: Payer: Self-pay | Admitting: Primary Care

## 2018-04-01 ENCOUNTER — Encounter: Payer: Self-pay | Admitting: Primary Care

## 2018-04-01 ENCOUNTER — Ambulatory Visit (INDEPENDENT_AMBULATORY_CARE_PROVIDER_SITE_OTHER): Payer: Managed Care, Other (non HMO) | Admitting: Primary Care

## 2018-04-01 ENCOUNTER — Telehealth: Payer: Self-pay

## 2018-04-01 VITALS — BP 130/80 | HR 61 | Temp 97.8°F | Ht 70.0 in | Wt 166.8 lb

## 2018-04-01 DIAGNOSIS — R0789 Other chest pain: Secondary | ICD-10-CM

## 2018-04-01 DIAGNOSIS — M542 Cervicalgia: Secondary | ICD-10-CM | POA: Diagnosis not present

## 2018-04-01 DIAGNOSIS — I10 Essential (primary) hypertension: Secondary | ICD-10-CM

## 2018-04-01 DIAGNOSIS — F329 Major depressive disorder, single episode, unspecified: Secondary | ICD-10-CM

## 2018-04-01 DIAGNOSIS — Z1211 Encounter for screening for malignant neoplasm of colon: Secondary | ICD-10-CM | POA: Diagnosis not present

## 2018-04-01 DIAGNOSIS — G8929 Other chronic pain: Secondary | ICD-10-CM | POA: Diagnosis not present

## 2018-04-01 DIAGNOSIS — F419 Anxiety disorder, unspecified: Secondary | ICD-10-CM | POA: Diagnosis not present

## 2018-04-01 DIAGNOSIS — F32A Depression, unspecified: Secondary | ICD-10-CM

## 2018-04-01 MED ORDER — SERTRALINE HCL 25 MG PO TABS: 25.0000 mg | ORAL_TABLET | Freq: Every day | ORAL | 1 refills | Status: DC

## 2018-04-01 NOTE — Patient Instructions (Signed)
Start sertraline (Zoloft) 25 mg tablets. Take 1/2 tablet daily for 6 days, then increase to 1 full tablet thereafter.   Stop by the front desk and speak with either Rosaria Ferries or Anastasiya regarding your referral to therapy.  Please schedule a follow up visit in 6 weeks for re-evaluation. Please message me sooner if you encounter any problems.  It was a pleasure to see you today!

## 2018-04-01 NOTE — Assessment & Plan Note (Addendum)
GAD 7 score of 18 and PHQ 9 score of 20 today. Weight has been stable based off of records. Suspect weight loss secondary to stress/anxiety for which we will treat today.   Discussed various treatment options including therapy vs medication, he elects for both. Referral placed for therapy.  Rx for sertraline 25 mg sent to pharmacy. Patient is to take 1/2 tablet daily for 6 days, then advance to 1 full tablet thereafter. We discussed possible side effects of headache, GI upset, drowsiness, and SI/HI. If thoughts of SI/HI develop, we discussed to present to the emergency immediately. Patient verbalized understanding.   Follow up in 6 weeks for re-evaluation.

## 2018-04-01 NOTE — Progress Notes (Signed)
Subjective:    Patient ID: Martin Deleon, male    DOB: December 26, 1955, 62 y.o.   MRN: [id number]  HPI  Martin Deleon is a 61 year old male with a history of hypertension, type 1 diabetes, hyperlipidemia who presents today with a chief complaint of anxiety and depression. He's also concerned about unintended recent weight loss.   He endorses losing 11 pounds within in the last 6 weeks. He endorses a good appetite and is eating regularly. He has been under a tremendous amount of stress as he's caring for his terminally ill mother, tending to his son's needs, frustrated with his new insulin pump, and is overstressed at work. He feels like he can't handle the stress any longer.   He gained weight over this past winter and got up to 177 lbs on his home scale, this is typical for him as he's not as active. His blood sugars are ranging 160 fasting in the morning which is higher than usual. He is following with endocrinology through [facility]. He denies bloody stools, he is due for screening colonoscopy and would like to proceed.  Last week he was stopped at a stop light and felt like he was going to pass out, he felt very anxious. He pulled over to the fire station and BP was 210/109, 230/110. Paramedics were called and his BP was 193/109. His ECG didn't show a cardiac event but he was encouraged to go to the emergency department for which he did not do. He's been checking his BP at home three times daily: morning:127/88, afternoon: 144/88, evening: 155/93. He can feel when his blood pressure increases with pressure behind his eyes.   Right now he's feeling "out of control" as he's not handing the increased stress very well. He's feeling more irritability at home and work, a loss of patience with people. He's been feeling this way for the past 2 months.   GAD 7 score of 18 and PHQ 9 score of 20 today. He was once on Clonazepam in the past and did well for 10 years, his PCP eventually started to wean him off so he  switched PCP's. His new PCP weaned him off of his Clonazepam which was very difficult as he went through withdrawal.   He denies rectal bleeding. He is due for repeat colonoscopy. He does have a history of chronic chest pain and has had several visits with cardiology and undergone full work up including two cardiac catheterizations. He denies history of CAD.   Wt Readings from Last 3 Encounters:  04/01/18 166 lb 12 oz (75.6 kg)  04/16/17 166 lb 12 oz (75.6 kg)  04/07/17 166 lb 6.4 oz (75.5 kg)   2) Chronic Neck Pain: Bulging disc to cervical spine and is following with Neurosurgery. He was scheduled to undergo surgery in January 2019 but cancelled as he didn't want to go through it. He's afraid that he won't be able to recover from the surgery and is nervous about becoming paralyzed. He continues to experience chronic neck pain daily and is taking Ibuprofen.    Review of Systems  Constitutional: Negative for fever.  Respiratory: Negative for shortness of breath.   Cardiovascular:       Chronic intermittent chest pain  Gastrointestinal: Negative for blood in stool.  Neurological: Positive for headaches. Negative for weakness.  Psychiatric/Behavioral: Positive for sleep disturbance. Negative for suicidal ideas. The patient is nervous/anxious.        See HPI  Past Medical History:  Diagnosis Date  . Anxiety   . Arthritis   . Diabetes mellitus without complication ([place])   . Headache   . Insulin pump in place   . Neuropathy due to secondary diabetes mellitus ([place])   . PONV (postoperative nausea and vomiting)      Social History   Socioeconomic History  . Marital status: Married    Spouse name: Not on file  . Number of children: Not on file  . Years of education: Not on file  . Highest education level: Not on file  Occupational History  . Not on file  Social Needs  . Financial resource strain: Not on file  . Food insecurity:    Worry: Not on file    Inability: Not on  file  . Transportation needs:    Medical: Not on file    Non-medical: Not on file  Tobacco Use  . Smoking status: Former Smoker    Packs/day: 1.00    Types: Cigarettes    Last attempt to quit: 03/16/1997    Years since quitting: 21.0  . Smokeless tobacco: Never Used  Substance and Sexual Activity  . Alcohol use: No  . Drug use: No  . Sexual activity: Not on file  Lifestyle  . Physical activity:    Days per week: Not on file    Minutes per session: Not on file  . Stress: Not on file  Relationships  . Social connections:    Talks on phone: Not on file    Gets together: Not on file    Attends religious service: Not on file    Active member of club or organization: Not on file    Attends meetings of clubs or organizations: Not on file    Relationship status: Not on file  . Intimate partner violence:    Fear of current or ex partner: Not on file    Emotionally abused: Not on file    Physically abused: Not on file    Forced sexual activity: Not on file  Other Topics Concern  . Not on file  Social History Narrative   Married.   2 children.   Works as a Nature conservation officer.   Enjoys golfing.     Past Surgical History:  Procedure Laterality Date  . APPENDECTOMY    . CARDIAC CATHETERIZATION    . CERVICAL FUSION  2005, 2006   [facility] Dr. Glenna Fellows  . FRACTURE SURGERY Right August 05, 1986   [place]  . 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 Heart disease Father   . Heart attack Father 22  . Penile cancer Father   . Arrhythmia Brother     No Known Allergies  Current Outpatient Medications on File Prior to Visit  Medication Sig Dispense Refill  . aspirin EC 81 MG tablet Take 81 mg by mouth daily.    Marland Kitchen gabapentin (NEURONTIN) 800 MG tablet Take 800 mg by mouth 3 (three) times daily.    .  Insulin Human (INSULIN PUMP) SOLN Inject into the skin every hour. Humalog Insulin Pump--patient stated he has five different rates depending on the time of day    . lisinopril (PRINIVIL,ZESTRIL) 20 MG tablet Take 20 mg by mouth daily.    . promethazine (PHENERGAN) 25 MG tablet Take 25  mg by mouth every 6 (six) hours as needed for nausea or vomiting.    . rizatriptan (MAXALT) 10 MG tablet Take 10 mg by mouth as needed for migraine. May repeat in 2 hours if needed    . simvastatin (ZOCOR) 40 MG tablet Take 40 mg by mouth daily at 6 PM.     . tiZANidine (ZANAFLEX) 4 MG tablet Take 4 mg by mouth 3 (three) times daily as needed for muscle spasms.      No current facility-administered medications on file prior to visit.     BP 130/80 (BP Location: Right Arm, Patient Position: Sitting, Cuff Size: Normal)   Pulse 61   Temp 97.8 F (36.6 C) (Oral)   Ht 5\' 10"  (1.778 m)   Wt 166 lb 12 oz (75.6 kg)   SpO2 98%   BMI 23.93 kg/m    Objective:   Physical Exam  Constitutional: He is oriented to person, place, and time. He appears well-nourished.  Neck: Neck supple.  Cardiovascular: Normal rate and regular rhythm.  Pulmonary/Chest: Effort normal and breath sounds normal.  Neurological: He is alert and oriented to person, place, and time.  Skin: Skin is warm and dry.  Psychiatric:  Tearful at one time during exam          Assessment & Plan:

## 2018-04-01 NOTE — Assessment & Plan Note (Signed)
Following with neurosurgery who recommended surgery, he declind. Chronic pain very likely contributing to anxiety. Recommended we treat anxiety and depression then he re-visit the option.

## 2018-04-01 NOTE — Assessment & Plan Note (Signed)
Chronic, no alarm signs. Suspect anxiety to be causing elevations in blood pressure and intermittent chest pain. ED precautions provided.

## 2018-04-01 NOTE — Assessment & Plan Note (Signed)
Blood pressure stable in the office today. Suspect elevate readings secondary to anxiety and stress. Continue Lisinopril. Will treat anxiety today.

## 2018-04-01 NOTE — Telephone Encounter (Signed)
Left message for patient to call Anastasiya back in regards to a referral-Anastasiya V Hopkins, RMA   

## 2018-04-16 ENCOUNTER — Ambulatory Visit: Payer: 59 | Admitting: Psychology

## 2018-04-20 ENCOUNTER — Telehealth: Payer: Self-pay

## 2018-04-20 NOTE — Telephone Encounter (Signed)
Gastroenterology Pre-Procedure Review  Request Date: 05/12/18  Requesting Physician: Dr. Allen Norris   PATIENT REVIEW QUESTIONS: The patient responded to the following health history questions as indicated:    1. Are you having any GI issues? No  2. Do you have a personal history of Polyps? No  3. Do you have a family history of Colon Cancer or Polyps? No  4. Diabetes Mellitus? Yes, type 1  5. Joint replacements in the past 12 months? No  6. Major health problems in the past 3 months? No  7. Any artificial heart valves, MVP, or defibrillator? No     MEDICATIONS & ALLERGIES:    Patient reports the following regarding taking any anticoagulation/antiplatelet therapy:   Plavix, Coumadin, Eliquis, Xarelto, Lovenox, Pradaxa, Brilinta, or Effient? No  Aspirin? Yes, 81 mg   Patient confirms/reports the following medications:  Current Outpatient Medications  Medication Sig Dispense Refill  . aspirin EC 81 MG tablet Take 81 mg by mouth daily.    Marland Kitchen gabapentin (NEURONTIN) 800 MG tablet Take 800 mg by mouth 3 (three) times daily.    . Insulin Human (INSULIN PUMP) SOLN Inject into the skin every hour. Humalog Insulin Pump--patient stated he has five different rates depending on the time of day    . lisinopril (PRINIVIL,ZESTRIL) 20 MG tablet Take 20 mg by mouth daily.    . promethazine (PHENERGAN) 25 MG tablet Take 25 mg by mouth every 6 (six) hours as needed for nausea or vomiting.    . rizatriptan (MAXALT) 10 MG tablet Take 10 mg by mouth as needed for migraine. May repeat in 2 hours if needed    . sertraline (ZOLOFT) 25 MG tablet Take 1 tablet (25 mg total) by mouth daily. 30 tablet 1  . simvastatin (ZOCOR) 40 MG tablet Take 40 mg by mouth daily at 6 PM.     . tiZANidine (ZANAFLEX) 4 MG tablet Take 4 mg by mouth 3 (three) times daily as needed for muscle spasms.      No current facility-administered medications for this visit.     Patient confirms/reports the following allergies:  No Known  Allergies  No orders of the defined types were placed in this encounter.   AUTHORIZATION INFORMATION Primary Insurance: 1D#: Group #:  Secondary Insurance: 1D#: Group #:  SCHEDULE INFORMATION: Date: 05/12/18 Time: Location: [place]

## 2018-04-22 ENCOUNTER — Other Ambulatory Visit: Payer: Self-pay

## 2018-04-22 DIAGNOSIS — Z1211 Encounter for screening for malignant neoplasm of colon: Secondary | ICD-10-CM

## 2018-05-12 ENCOUNTER — Encounter
Admission: RE | Disposition: A | Discharge status: Home / Self Care | Payer: Self-pay | Source: Ambulatory Visit | Attending: Gastroenterology

## 2018-05-12 ENCOUNTER — Ambulatory Visit: Payer: 59 | Admitting: Anesthesiology

## 2018-05-12 ENCOUNTER — Encounter: Payer: Self-pay | Admitting: Anesthesiology

## 2018-05-12 ENCOUNTER — Ambulatory Visit
Admission: RE | Admit: 2018-05-12 | Discharge: 2018-05-12 | Disposition: A | Discharge status: Home / Self Care | Payer: 59 | Source: Ambulatory Visit | Attending: Gastroenterology | Admitting: Gastroenterology

## 2018-05-12 DIAGNOSIS — K5730 Diverticulosis of large intestine without perforation or abscess without bleeding: Secondary | ICD-10-CM | POA: Insufficient documentation

## 2018-05-12 DIAGNOSIS — E7800 Pure hypercholesterolemia, unspecified: Secondary | ICD-10-CM | POA: Diagnosis not present

## 2018-05-12 DIAGNOSIS — Z1211 Encounter for screening for malignant neoplasm of colon: Secondary | ICD-10-CM | POA: Diagnosis not present

## 2018-05-12 DIAGNOSIS — I10 Essential (primary) hypertension: Secondary | ICD-10-CM | POA: Insufficient documentation

## 2018-05-12 DIAGNOSIS — E1140 Type 2 diabetes mellitus with diabetic neuropathy, unspecified: Secondary | ICD-10-CM | POA: Insufficient documentation

## 2018-05-12 DIAGNOSIS — Z79899 Other long term (current) drug therapy: Secondary | ICD-10-CM | POA: Diagnosis not present

## 2018-05-12 DIAGNOSIS — Z794 Long term (current) use of insulin: Secondary | ICD-10-CM | POA: Insufficient documentation

## 2018-05-12 DIAGNOSIS — Z7982 Long term (current) use of aspirin: Secondary | ICD-10-CM | POA: Insufficient documentation

## 2018-05-12 DIAGNOSIS — Z87891 Personal history of nicotine dependence: Secondary | ICD-10-CM | POA: Diagnosis not present

## 2018-05-12 DIAGNOSIS — K640 First degree hemorrhoids: Secondary | ICD-10-CM | POA: Diagnosis not present

## 2018-05-12 DIAGNOSIS — F419 Anxiety disorder, unspecified: Secondary | ICD-10-CM | POA: Insufficient documentation

## 2018-05-12 HISTORY — PX: COLONOSCOPY WITH PROPOFOL: SHX5780

## 2018-05-12 HISTORY — DX: Essential (primary) hypertension: I10

## 2018-05-12 HISTORY — DX: Pure hypercholesterolemia, unspecified: E78.00

## 2018-05-12 HISTORY — DX: Angina pectoris, unspecified: I20.9

## 2018-05-12 LAB — GLUCOSE, CAPILLARY: GLUCOSE-CAPILLARY: 201 mg/dL — AB (ref 65–99)

## 2018-05-12 SURGERY — COLONOSCOPY WITH PROPOFOL
Anesthesia: General

## 2018-05-12 MED ORDER — SODIUM CHLORIDE 0.9 % IV SOLN
INTRAVENOUS | Status: DC
  Administered 2018-05-12: 1000 mL via INTRAVENOUS

## 2018-05-12 MED ORDER — LIDOCAINE HCL (CARDIAC) PF 100 MG/5ML IV SOSY
PREFILLED_SYRINGE | INTRAVENOUS | Status: DC | PRN
  Administered 2018-05-12: 50 mg via INTRAVENOUS

## 2018-05-12 MED ORDER — PROPOFOL 500 MG/50ML IV EMUL
INTRAVENOUS | Status: AC
  Filled 2018-05-12: qty 50

## 2018-05-12 MED ORDER — PROPOFOL 500 MG/50ML IV EMUL
INTRAVENOUS | Status: DC | PRN
  Administered 2018-05-12: 130 ug/kg/min via INTRAVENOUS

## 2018-05-12 MED ORDER — PROPOFOL 10 MG/ML IV BOLUS
INTRAVENOUS | Status: DC | PRN
  Administered 2018-05-12: 30 mg via INTRAVENOUS
  Administered 2018-05-12: 70 mg via INTRAVENOUS

## 2018-05-12 NOTE — H&P (Signed)
Lucilla Lame, MD [facility] [address], [place], [place] [address] Phone: [phone] Fax : [phone]  Primary Care Physician:  Pleas Koch, NP Primary Gastroenterologist:  Dr. Allen Norris  Pre-Procedure History & Physical: HPI:  Martin Deleon is a 62 y.o. male is here for a screening colonoscopy.   Past Medical History:  Diagnosis Date  . Anginal pain ([place])   . Anxiety   . Arthritis   . Diabetes mellitus without complication ([place])   . Headache   . Hypercholesteremia   . Hypertension   . Insulin pump in place   . Neuropathy due to secondary diabetes mellitus ([place])   . PONV (postoperative nausea and vomiting)     Past Surgical History:  Procedure Laterality Date  . APPENDECTOMY    . CARDIAC CATHETERIZATION    . CERVICAL FUSION  2005, 2006   [facility] Dr. Glenna Fellows  . FRACTURE SURGERY Right August 05, 1986   [place]  . 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 Authorizing Provider  sertraline (ZOLOFT) 25 MG tablet Take 1 tablet (25 mg total) by mouth daily. 04/01/18  Yes Pleas Koch, NP  aspirin EC 81 MG tablet Take 81 mg by mouth daily.    [provider]  gabapentin (NEURONTIN) 800 MG tablet Take 800 mg by mouth 3 (three) times daily.    [provider]  Insulin Human (INSULIN PUMP) SOLN Inject into the skin every hour. Humalog Insulin Pump--patient stated he has five different rates depending on the time of day    [provider]  lisinopril (PRINIVIL,ZESTRIL) 20 MG tablet Take 20 mg by mouth daily.    [provider]  promethazine (PHENERGAN) 25 MG tablet Take 25 mg by mouth every 6 (six) hours as needed for nausea or vomiting.    [provider]  rizatriptan (MAXALT) 10 MG tablet Take 10 mg by mouth  as needed for migraine. May repeat in 2 hours if needed    [provider]  simvastatin (ZOCOR) 40 MG tablet Take 40 mg by mouth daily at 6 PM.     [provider]  tiZANidine (ZANAFLEX) 4 MG tablet Take 4 mg by mouth 3 (three) times daily as needed for muscle spasms.     [provider]    Allergies as of 04/22/2018  . (No Known Allergies)    Family History  Problem Relation Age of Onset  . Heart disease Father   . Heart attack Father 85  . Penile cancer Father   . Arrhythmia Brother     Social History   Socioeconomic History  . Marital status: Married    Spouse name: Not on file  . Number of children: Not on file  . Years of education: Not on file  . Highest education level: Not on file  Occupational History  . Not on file  Social Needs  . Financial resource strain: Not on file  . Food insecurity:    Worry: Not on file    Inability: Not on file  . Transportation needs:    Medical: Not on file    Non-medical: Not on file  Tobacco Use  . Smoking status: Former Smoker    Packs/day: 1.00    Types: Cigarettes    Last attempt  to quit: 03/16/1997    Years since quitting: 21.1  . Smokeless tobacco: Never Used  Substance and Sexual Activity  . Alcohol use: No  . Drug use: Yes    Types: Marijuana  . Sexual activity: Not on file  Lifestyle  . Physical activity:    Days per week: Not on file    Minutes per session: Not on file  . Stress: Not on file  Relationships  . Social connections:    Talks on phone: Not on file    Gets together: Not on file    Attends religious service: Not on file    Active member of club or organization: Not on file    Attends meetings of clubs or organizations: Not on file    Relationship status: Not on file  . Intimate partner violence:    Fear of current or ex partner: Not on file    Emotionally abused: Not on file    Physically abused: Not on file    Forced sexual activity: Not on file  Other Topics Concern    . Not on file  Social History Narrative   Married.   2 children.   Works as a Nature conservation officer.   Enjoys golfing.     Review of Systems: See HPI, otherwise negative ROS  Physical Exam: BP 128/77   Pulse 71   Temp (!) 97.1 F (36.2 C) (Tympanic)   Resp 18   Ht 5\' 10"  (1.778 m)   Wt 170 lb (77.1 kg)   SpO2 98%   BMI 24.39 kg/m  General:   Alert,  pleasant and cooperative in NAD Head:  Normocephalic and atraumatic. Neck:  Supple; no masses or thyromegaly. Lungs:  Clear throughout to auscultation.    Heart:  Regular rate and rhythm. Abdomen:  Soft, nontender and nondistended. Normal bowel sounds, without guarding, and without rebound.   Neurologic:  Alert and  oriented x4;  grossly normal neurologically.  Impression/Plan: Martin Deleon is now here to undergo a screening colonoscopy.  Risks, benefits, and alternatives regarding colonoscopy have been reviewed with the patient.  Questions have been answered.  All parties agreeable.

## 2018-05-12 NOTE — Transfer of Care (Signed)
Immediate Anesthesia Transfer of Care Note  Patient: Martin Deleon  Procedure(s) Performed: COLONOSCOPY WITH PROPOFOL (N/A )  Patient Location: PACU  Anesthesia Type:General  Level of Consciousness: sedated and responds to stimulation  Airway & Oxygen Therapy: Patient Spontanous Breathing and Patient connected to nasal cannula oxygen  Post-op Assessment: Report given to RN and Post -op Vital signs reviewed and stable  Post vital signs: Reviewed and stable  Last Vitals:  Vitals Value Taken Time  BP 90/55 05/12/2018  8:43 AM  Temp    Pulse 80 05/12/2018  8:43 AM  Resp 15 05/12/2018  8:43 AM  SpO2 98 % 05/12/2018  8:43 AM    Last Pain:  Vitals:   05/12/18 0842  TempSrc: (P) Tympanic  PainSc:          Complications: No apparent anesthesia complications

## 2018-05-12 NOTE — Anesthesia Post-op Follow-up Note (Signed)
Anesthesia QCDR form completed.        

## 2018-05-12 NOTE — Op Note (Signed)
[facility] Gastroenterology Patient Name: Martin Deleon Procedure Date: 05/12/2018 7:54 AM MRN: [id number] Account #: [id number] Date of Birth: [birth year] Admit Type: Outpatient Age: 62 Room: [facility] ENDO ROOM 4 Gender: Male Note Status: Finalized Procedure:            Colonoscopy Indications:          Screening for colorectal malignant neoplasm Providers:            Lucilla Lame MD, MD Referring MD:         Pleas Koch (Referring MD) Medicines:            Propofol per Anesthesia Complications:        No immediate complications. Procedure:            Pre-Anesthesia Assessment:                       - Prior to the procedure, a History and Physical was                        performed, and patient medications and allergies were                        reviewed. The patient's tolerance of previous                        anesthesia was also reviewed. The risks and benefits of                        the procedure and the sedation options and risks were                        discussed with the patient. All questions were                        answered, and informed consent was obtained. Prior                        Anticoagulants: The patient has taken no previous                        anticoagulant or antiplatelet agents. ASA Grade                        Assessment: II - A patient with mild systemic disease.                        After reviewing the risks and benefits, the patient was                        deemed in satisfactory condition to undergo the                        procedure.                       After obtaining informed consent, the colonoscope was                        passed under direct vision. Throughout the procedure,  the patient's blood pressure, pulse, and oxygen                        saturations were monitored continuously. The                        Colonoscope was introduced through the anus and         advanced to the the cecum, identified by appendiceal                        orifice and ileocecal valve. The colonoscopy was                        performed without difficulty. The patient tolerated the                        procedure well. The quality of the bowel preparation                        was good. Findings:      The perianal and digital rectal examinations were normal.      Multiple small-mouthed diverticula were found in the sigmoid colon.      Non-bleeding internal hemorrhoids were found during retroflexion. The       hemorrhoids were Grade I (internal hemorrhoids that do not prolapse). Impression:           - Diverticulosis in the sigmoid colon.                       - Non-bleeding internal hemorrhoids.                       - No specimens collected. Recommendation:       - Discharge patient to home.                       - Resume previous diet.                       - Continue present medications.                       - Await pathology results.                       - Repeat colonoscopy in 10 years for screening unless                        any change in family history or lower GI problems. Procedure Code(s):    --- Professional ---                       [phone], Colonoscopy, flexible; diagnostic, including                        collection of specimen(s) by brushing or washing, when                        performed (separate procedure) Diagnosis Code(s):    --- Professional ---                       Z12.11, Encounter  for screening for malignant neoplasm                        of colon CPT copyright 2017 American Medical Association. All rights reserved. The codes documented in this report are preliminary and upon coder review may  be revised to meet current compliance requirements. Lucilla Lame MD, MD 05/12/2018 8:41:12 AM This report has been signed electronically. Number of Addenda: 0 Note Initiated On: 05/12/2018 7:54 AM Scope Withdrawal Time: 0 hours 9  minutes 31 seconds  Total Procedure Duration: 0 hours 18 minutes 18 seconds       [facility]

## 2018-05-12 NOTE — Anesthesia Postprocedure Evaluation (Signed)
Anesthesia Post Note  Patient: Martin Deleon  Procedure(s) Performed: COLONOSCOPY WITH PROPOFOL (N/A )  Patient location during evaluation: Endoscopy Anesthesia Type: General Level of consciousness: awake and alert Pain management: pain level controlled Vital Signs Assessment: post-procedure vital signs reviewed and stable Respiratory status: spontaneous breathing, nonlabored ventilation, respiratory function stable and patient connected to nasal cannula oxygen Cardiovascular status: blood pressure returned to baseline and stable Postop Assessment: no apparent nausea or vomiting Anesthetic complications: no     Last Vitals:  Vitals:   05/12/18 0902 05/12/18 0912  BP: 105/61 115/67  Pulse:    Resp:    Temp:    SpO2:      Last Pain:  Vitals:   05/12/18 0926  TempSrc:   PainSc: 0-No pain                 Albena Comes S

## 2018-05-12 NOTE — Anesthesia Preprocedure Evaluation (Addendum)
Anesthesia Evaluation  Patient identified by MRN, date of birth, ID band Patient awake    Reviewed: Allergy & Precautions, NPO status , Patient's Chart, lab work & pertinent test results, reviewed documented beta blocker date and time   History of Anesthesia Complications (+) PONV and history of anesthetic complications  Airway Mallampati: II  TM Distance: >3 FB     Dental  (+) Chipped   Pulmonary former smoker,           Cardiovascular hypertension,      Neuro/Psych  Headaches, Anxiety    GI/Hepatic   Endo/Other  diabetes, Type 1  Renal/GU      Musculoskeletal  (+) Arthritis ,   Abdominal   Peds  Hematology   Anesthesia Other Findings Insulin pump off for now. Will check post op.  Reproductive/Obstetrics                            Anesthesia Physical Anesthesia Plan  ASA: III  Anesthesia Plan: General   Post-op Pain Management:    Induction: Intravenous  PONV Risk Score and Plan:   Airway Management Planned:   Additional Equipment:   Intra-op Plan:   Post-operative Plan:   Informed Consent: I have reviewed the patients History and Physical, chart, labs and discussed the procedure including the risks, benefits and alternatives for the proposed anesthesia with the patient or authorized representative who has indicated his/her understanding and acceptance.     Plan Discussed with: CRNA  Anesthesia Plan Comments:         Anesthesia Quick Evaluation

## 2018-05-12 NOTE — Anesthesia Procedure Notes (Signed)
Performed by: Rafan Sanders, CRNA Pre-anesthesia Checklist: Patient identified, Emergency Drugs available, Suction available, Patient being monitored and Timeout performed Patient Re-evaluated:Patient Re-evaluated prior to induction Oxygen Delivery Method: Nasal cannula Induction Type: IV induction       

## 2018-05-13 ENCOUNTER — Ambulatory Visit (INDEPENDENT_AMBULATORY_CARE_PROVIDER_SITE_OTHER): Payer: 59 | Admitting: Primary Care

## 2018-05-13 ENCOUNTER — Encounter: Payer: Self-pay | Admitting: Primary Care

## 2018-05-13 DIAGNOSIS — F329 Major depressive disorder, single episode, unspecified: Secondary | ICD-10-CM

## 2018-05-13 DIAGNOSIS — F419 Anxiety disorder, unspecified: Secondary | ICD-10-CM

## 2018-05-13 MED ORDER — SERTRALINE HCL 25 MG PO TABS: 25.0000 mg | ORAL_TABLET | Freq: Every day | ORAL | 1 refills | Status: DC

## 2018-05-13 NOTE — Patient Instructions (Signed)
Continue sertraline (Zoloft) 25 mg tablets for anxiety and depression.  Please touch base with me through my chart if you need anything.  It was a pleasure to see you today!

## 2018-05-13 NOTE — Progress Notes (Signed)
Subjective:    Patient ID: Martin Deleon, male    DOB: [birth year], 62 y.o.   MRN: [id number]  HPI  Martin Deleon is a 62 year old male who presents today for follow up of anxiety and depression.  He was last evaluated on 04/01/18 with concerns of weight loss for which he believed to be secondary to stress. He endorsed being under a large amount of stress as he is caring for his family and is feeling overstressed at work. He'd also noticed fluctuations in his blood pressure (very elevated) and feelings of being "out of control". GAD 7 score of 18 and PHQ 9 score of 20 during his visit so he was initiated on Zoloft 25 mg and referred to therapy.   Since his last visit he's doing better. Positive effects of Zoloft include ability to tolerate stressful situations at work and is finding more patience in traffic. He denies fluctuations in blood pressure readings like last visit. He denies SI/HI, headaches, GI upset. Overall he's feeling much improved.   Wt Readings from Last 3 Encounters:  05/13/18 164 lb 4 oz (74.5 kg)  05/12/18 170 lb (77.1 kg)  04/01/18 166 lb 12 oz (75.6 kg)     Review of Systems  Gastrointestinal: Negative for abdominal pain and nausea.  Neurological: Negative for headaches.  Psychiatric/Behavioral: Negative for suicidal ideas.       See HPI       Past Medical History:  Diagnosis Date  . Anginal pain ([place])   . Anxiety   . Arthritis   . Diabetes mellitus without complication ([place])   . Headache   . Hypercholesteremia   . Hypertension   . Insulin pump in place   . Neuropathy due to secondary diabetes mellitus ([place])   . PONV (postoperative nausea and vomiting)      Social History   Socioeconomic History  . Marital status: Married    Spouse name: Not on file  . Number of children: Not on file  . Years of education: Not on file  . Highest education level: Not on file  Occupational History  . Not on file  Social Needs  . Financial resource strain: Not  on file  . Food insecurity:    Worry: Not on file    Inability: Not on file  . Transportation needs:    Medical: Not on file    Non-medical: Not on file  Tobacco Use  . Smoking status: Former Smoker    Packs/day: 1.00    Types: Cigarettes    Last attempt to quit: 03/16/1997    Years since quitting: 21.1  . Smokeless tobacco: Never Used  Substance and Sexual Activity  . Alcohol use: No  . Drug use: Yes    Types: Marijuana  . Sexual activity: Not on file  Lifestyle  . Physical activity:    Days per week: Not on file    Minutes per session: Not on file  . Stress: Not on file  Relationships  . Social connections:    Talks on phone: Not on file    Gets together: Not on file    Attends religious service: Not on file    Active member of club or organization: Not on file    Attends meetings of clubs or organizations: Not on file    Relationship status: Not on file  . Intimate partner violence:    Fear of current or ex partner: Not on file    Emotionally abused: Not  on file    Physically abused: Not on file    Forced sexual activity: Not on file  Other Topics Concern  . Not on file  Social History Narrative   Married.   2 children.   Works as a Nature conservation officer.   Enjoys golfing.     Past Surgical History:  Procedure Laterality Date  . APPENDECTOMY    . CARDIAC CATHETERIZATION    . CERVICAL FUSION  2005, 2006   [facility] Dr. Glenna Fellows  . FRACTURE SURGERY Right August 05, 1986   [place]  . 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 Heart disease Father   . Heart attack Father 11  . Penile cancer Father   . Arrhythmia Brother     Allergies  Allergen Reactions  . Codeine Nausea And Vomiting    Current Outpatient Medications on File Prior to Visit  Medication Sig Dispense Refill  .  aspirin EC 81 MG tablet Take 81 mg by mouth daily.    Marland Kitchen gabapentin (NEURONTIN) 800 MG tablet Take 800 mg by mouth 3 (three) times daily.    . Insulin Human (INSULIN PUMP) SOLN Inject into the skin every hour. Humalog Insulin Pump--patient stated he has five different rates depending on the time of day    . lisinopril (PRINIVIL,ZESTRIL) 20 MG tablet Take 20 mg by mouth daily.    . promethazine (PHENERGAN) 25 MG tablet Take 25 mg by mouth every 6 (six) hours as needed for nausea or vomiting.    . rizatriptan (MAXALT) 10 MG tablet Take 10 mg by mouth as needed for migraine. May repeat in 2 hours if needed    . simvastatin (ZOCOR) 40 MG tablet Take 40 mg by mouth daily at 6 PM.     . tiZANidine (ZANAFLEX) 4 MG tablet Take 4 mg by mouth 3 (three) times daily as needed for muscle spasms.      No current facility-administered medications on file prior to visit.     BP 110/64   Pulse (!) 56   Temp 98.1 F (36.7 C) (Oral)   Ht 5\' 10"  (1.778 m)   Wt 164 lb 4 oz (74.5 kg)   SpO2 96%   BMI 23.57 kg/m     Objective:   Physical Exam  Constitutional: He appears well-nourished.  Neck: Neck supple.  Cardiovascular: Normal rate and regular rhythm.  Respiratory: Effort normal and breath sounds normal.  Skin: Skin is warm and dry.  Psychiatric: He has a normal mood and affect.  Improved mood from last visit           Assessment & Plan:

## 2018-05-13 NOTE — Assessment & Plan Note (Signed)
Improved on Zoloft 25 mg. Denies SI/HI. Continue Zoloft at 25 mg for now, refills sent to pharmacy.

## 2018-07-07 ENCOUNTER — Encounter: Payer: Self-pay | Admitting: Primary Care

## 2018-07-08 ENCOUNTER — Encounter: Payer: Self-pay | Admitting: Primary Care

## 2018-07-08 ENCOUNTER — Emergency Department
Admission: EM | Admit: 2018-07-08 | Discharge: 2018-07-08 | Disposition: A | Discharge status: Home / Self Care | Payer: Managed Care, Other (non HMO)

## 2018-07-08 DIAGNOSIS — M542 Cervicalgia: Principal | ICD-10-CM

## 2018-07-08 DIAGNOSIS — G8929 Other chronic pain: Secondary | ICD-10-CM

## 2018-07-08 MED ORDER — TIZANIDINE HCL 4 MG PO TABS: 4.0000 mg | ORAL_TABLET | Freq: Three times a day (TID) | ORAL | 0 refills | Status: DC | PRN

## 2018-07-11 ENCOUNTER — Encounter: Payer: Self-pay | Admitting: Primary Care

## 2018-07-16 ENCOUNTER — Encounter: Payer: Self-pay | Admitting: Primary Care

## 2018-07-16 ENCOUNTER — Ambulatory Visit (INDEPENDENT_AMBULATORY_CARE_PROVIDER_SITE_OTHER): Payer: Managed Care, Other (non HMO) | Admitting: Primary Care

## 2018-07-16 VITALS — BP 134/70 | HR 57 | Temp 97.9°F | Ht 70.0 in | Wt 165.2 lb

## 2018-07-16 DIAGNOSIS — I10 Essential (primary) hypertension: Secondary | ICD-10-CM | POA: Diagnosis not present

## 2018-07-16 DIAGNOSIS — F329 Major depressive disorder, single episode, unspecified: Secondary | ICD-10-CM | POA: Diagnosis not present

## 2018-07-16 DIAGNOSIS — F419 Anxiety disorder, unspecified: Secondary | ICD-10-CM | POA: Diagnosis not present

## 2018-07-16 DIAGNOSIS — F32A Depression, unspecified: Secondary | ICD-10-CM

## 2018-07-16 MED ORDER — SERTRALINE HCL 50 MG PO TABS: 50.0000 mg | ORAL_TABLET | Freq: Every day | ORAL | 0 refills | Status: DC

## 2018-07-16 NOTE — Assessment & Plan Note (Signed)
Stable in the office today.  Spikes are most likely due to increased anxiety.  Continue to monitor.

## 2018-07-16 NOTE — Patient Instructions (Signed)
We've increased the dose of your sertraline (Zoloft) to 50 mg. You may take two of your 25 mg tablets to equal 50 mg until your bottle is out.  Please send me an update via My Chart in 4 weeks.  Please call me sooner if you have any problems.  It was a pleasure to see you today!

## 2018-07-16 NOTE — Progress Notes (Signed)
Subjective:    Patient ID: MEKIAH CAMBRIDGE, male    DOB: 12-31-1955, 62 y.o.   MRN: [id number]  HPI  Mr. Elamin is a 62 year old male with a history of hypertension, anxiety and depression,type 1 diabetes, hyperlipidemia who presents today with a chief complaint of elevated blood pressure readings.  He sent a message through our online portal with reports of an increase in anxiety with symptoms of "lashing out at others" and feeling "shaky on the inside".   He is currently managed on Zoloft 25 mg and during his follow up visit in late May this year he endorsed feeling improved. He was referred to therapy in April 219 but never went as he wasn't ready.   Over the last 1-2 months he's noticed increased irritability, inability to handle stressful situations, increased anxiety. He felt that the Zoloft helped for a temporary period of time, but doesn't feel effective now. Symptoms mostly occur when encountering stressful situations at work. His last incidence was on 07/08/18.   He's checked his BP during these episodes with very high readings: 177/99, 233/119.   BP Readings from Last 3 Encounters:  07/16/18 134/70  05/13/18 110/64  05/12/18 115/67     Review of Systems  Eyes: Negative for visual disturbance.  Respiratory: Negative for shortness of breath.   Cardiovascular: Negative for chest pain.  Neurological: Negative for dizziness.  Psychiatric/Behavioral:       See HPI. Denies SI/HI.       Past Medical History:  Diagnosis Date  . Anginal pain ([place])   . Anxiety   . Arthritis   . Diabetes mellitus without complication ([place])   . Headache   . Hypercholesteremia   . Hypertension   . Insulin pump in place   . Neuropathy due to secondary diabetes mellitus ([place])   . PONV (postoperative nausea and vomiting)      Social History   Socioeconomic History  . Marital status: Married    Spouse name: Not on file  . Number of children: Not on file  . Years of education: Not on  file  . Highest education level: Not on file  Occupational History  . Not on file  Social Needs  . Financial resource strain: Not on file  . Food insecurity:    Worry: Not on file    Inability: Not on file  . Transportation needs:    Medical: Not on file    Non-medical: Not on file  Tobacco Use  . Smoking status: Former Smoker    Packs/day: 1.00    Types: Cigarettes    Last attempt to quit: 03/16/1997    Years since quitting: 21.3  . Smokeless tobacco: Never Used  Substance and Sexual Activity  . Alcohol use: No  . Drug use: Yes    Types: Marijuana  . Sexual activity: Not on file  Lifestyle  . Physical activity:    Days per week: Not on file    Minutes per session: Not on file  . Stress: Not on file  Relationships  . Social connections:    Talks on phone: Not on file    Gets together: Not on file    Attends religious service: Not on file    Active member of club or organization: Not on file    Attends meetings of clubs or organizations: Not on file    Relationship status: Not on file  . Intimate partner violence:    Fear of current or ex partner:  Not on file    Emotionally abused: Not on file    Physically abused: Not on file    Forced sexual activity: Not on file  Other Topics Concern  . Not on file  Social History Narrative   Married.   2 children.   Works as a Nature conservation officer.   Enjoys golfing.     Past Surgical History:  Procedure Laterality Date  . APPENDECTOMY    . CARDIAC CATHETERIZATION    . CERVICAL FUSION  2005, 2006   [facility] Dr. Glenna Fellows  . COLONOSCOPY WITH PROPOFOL N/A 05/12/2018   Procedure: COLONOSCOPY WITH PROPOFOL;  Surgeon: Lucilla Lame, MD;  Location: [facility] ENDOSCOPY;  Service: Endoscopy;  Laterality: N/A;  . FRACTURE SURGERY Right August 05, 1986   Elbow  . 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 Heart disease Father   . Heart attack Father 4  . Penile cancer Father   . Arrhythmia Brother     Allergies  Allergen Reactions  . Codeine Nausea And Vomiting    Current Outpatient Medications on File Prior to Visit  Medication Sig Dispense Refill  . aspirin EC 81 MG tablet Take 81 mg by mouth daily.    Marland Kitchen gabapentin (NEURONTIN) 800 MG tablet Take 800 mg by mouth 3 (three) times daily.    . Insulin Human (INSULIN PUMP) SOLN Inject into the skin every hour. Humalog Insulin Pump--patient stated he has five different rates depending on the time of day    . lisinopril (PRINIVIL,ZESTRIL) 20 MG tablet Take 20 mg by mouth daily.    . promethazine (PHENERGAN) 25 MG tablet Take 25 mg by mouth every 6 (six) hours as needed for nausea or vomiting.    . simvastatin (ZOCOR) 40 MG tablet Take 40 mg by mouth daily at 6 PM.     . tiZANidine (ZANAFLEX) 4 MG tablet Take 1 tablet (4 mg total) by mouth 3 (three) times daily as needed for muscle spasms. 90 tablet 0  . rizatriptan (MAXALT) 10 MG tablet Take 10 mg by mouth as needed for migraine. May repeat in 2 hours if needed     No current facility-administered medications on file prior to visit.     BP 134/70   Pulse (!) 57   Temp 97.9 F (36.6 C) (Oral)   Ht 5\' 10"  (1.778 m)   Wt 165 lb 4 oz (75 kg)   SpO2 96%   BMI 23.71 kg/m    Objective:   Physical Exam  Constitutional: He appears well-nourished.  Neck: Neck supple.  Cardiovascular: Normal rate and regular rhythm.  Respiratory: Effort normal and breath sounds normal.  Skin: Skin is warm and dry.  Psychiatric: He has a normal mood and affect.           Assessment & Plan:

## 2018-07-16 NOTE — Assessment & Plan Note (Signed)
Deteriorated.  Do believe that Zoloft is still appropriate treatment, needs higher dose. Increase dose to 50 mg, new Rx sent to pharmacy. He will update via My Chart in 4 weeks. Denies SI/HI.

## 2018-09-03 DIAGNOSIS — R112 Nausea with vomiting, unspecified: Secondary | ICD-10-CM | POA: Insufficient documentation

## 2018-09-03 DIAGNOSIS — R110 Nausea: Secondary | ICD-10-CM

## 2018-09-03 MED ORDER — PROMETHAZINE HCL 25 MG PO TABS: 25.0000 mg | ORAL_TABLET | Freq: Three times a day (TID) | ORAL | 0 refills | Status: DC | PRN

## 2018-09-03 NOTE — Assessment & Plan Note (Signed)
Managed on promethazine PRN for hypoglycemia induced nausea. He uses this sparingly. Refills sent to pharmacy.

## 2018-10-06 ENCOUNTER — Other Ambulatory Visit: Payer: Self-pay | Admitting: Primary Care

## 2018-10-06 DIAGNOSIS — M542 Cervicalgia: Principal | ICD-10-CM

## 2018-10-06 DIAGNOSIS — G8929 Other chronic pain: Secondary | ICD-10-CM

## 2018-10-07 NOTE — Telephone Encounter (Signed)
Last prescribed on 07/08/2018 Last office visit on 07/16/2018

## 2018-10-07 NOTE — Telephone Encounter (Signed)
Noted, refill sent to pharmacy. 

## 2018-11-24 DIAGNOSIS — G629 Polyneuropathy, unspecified: Secondary | ICD-10-CM

## 2018-11-24 MED ORDER — GABAPENTIN 800 MG PO TABS: 800.0000 mg | ORAL_TABLET | Freq: Three times a day (TID) | ORAL | 0 refills | Status: DC

## 2019-01-08 ENCOUNTER — Encounter: Payer: Self-pay | Admitting: Primary Care

## 2019-01-08 ENCOUNTER — Ambulatory Visit (INDEPENDENT_AMBULATORY_CARE_PROVIDER_SITE_OTHER): Payer: Managed Care, Other (non HMO) | Admitting: Primary Care

## 2019-01-08 VITALS — BP 108/70 | HR 60 | Temp 98.1°F | Ht 70.0 in | Wt 170.0 lb

## 2019-01-08 DIAGNOSIS — E785 Hyperlipidemia, unspecified: Secondary | ICD-10-CM | POA: Diagnosis not present

## 2019-01-08 DIAGNOSIS — E1040 Type 1 diabetes mellitus with diabetic neuropathy, unspecified: Secondary | ICD-10-CM

## 2019-01-08 DIAGNOSIS — Z1159 Encounter for screening for other viral diseases: Secondary | ICD-10-CM | POA: Diagnosis not present

## 2019-01-08 DIAGNOSIS — Z0000 Encounter for general adult medical examination without abnormal findings: Secondary | ICD-10-CM

## 2019-01-08 DIAGNOSIS — G43701 Chronic migraine without aura, not intractable, with status migrainosus: Secondary | ICD-10-CM

## 2019-01-08 DIAGNOSIS — F329 Major depressive disorder, single episode, unspecified: Secondary | ICD-10-CM

## 2019-01-08 DIAGNOSIS — Z23 Encounter for immunization: Secondary | ICD-10-CM

## 2019-01-08 DIAGNOSIS — I10 Essential (primary) hypertension: Secondary | ICD-10-CM

## 2019-01-08 DIAGNOSIS — G8929 Other chronic pain: Secondary | ICD-10-CM

## 2019-01-08 DIAGNOSIS — M542 Cervicalgia: Secondary | ICD-10-CM

## 2019-01-08 DIAGNOSIS — F419 Anxiety disorder, unspecified: Secondary | ICD-10-CM

## 2019-01-08 DIAGNOSIS — Z0001 Encounter for general adult medical examination with abnormal findings: Secondary | ICD-10-CM | POA: Insufficient documentation

## 2019-01-08 DIAGNOSIS — R0789 Other chest pain: Secondary | ICD-10-CM

## 2019-01-08 LAB — COMPREHENSIVE METABOLIC PANEL
ALBUMIN: 4.3 g/dL (ref 3.5–5.2)
ALK PHOS: 66 U/L (ref 39–117)
ALT: 16 U/L (ref 0–53)
AST: 20 U/L (ref 0–37)
BUN: 13 mg/dL (ref 6–23)
CO2: 30 mEq/L (ref 19–32)
Calcium: 9.2 mg/dL (ref 8.4–10.5)
Chloride: 102 mEq/L (ref 96–112)
Creatinine, Ser: 0.81 mg/dL (ref 0.40–1.50)
GFR: 96.78 mL/min (ref >60.00)
Glucose, Bld: 149 mg/dL — ABNORMAL HIGH (ref 70–99)
POTASSIUM: 4.4 meq/L (ref 3.5–5.1)
SODIUM: 136 meq/L (ref 135–145)
TOTAL PROTEIN: 6.5 g/dL (ref 6.0–8.3)
Total Bilirubin: 0.5 mg/dL (ref 0.2–1.2)

## 2019-01-08 LAB — LIPID PANEL
Cholesterol: 141 mg/dL (ref 0–200)
HDL: 59.8 mg/dL (ref >39.00)
LDL Cholesterol: 74 mg/dL (ref 0–99)
NonHDL: 80.81
Total CHOL/HDL Ratio: 2
Triglycerides: 33 mg/dL (ref 0.0–149.0)
VLDL: 6.6 mg/dL (ref 0.0–40.0)

## 2019-01-08 MED ORDER — ZOSTER VAC RECOMB ADJUVANTED 50 MCG/0.5ML IM SUSR: 0.5000 mL | Freq: Once | INTRAMUSCULAR | 1 refills | Status: AC

## 2019-01-08 MED ORDER — SERTRALINE HCL 100 MG PO TABS: 100.0000 mg | ORAL_TABLET | Freq: Every day | ORAL | 0 refills | Status: DC

## 2019-01-08 MED ORDER — SERTRALINE HCL 100 MG PO TABS: 100.0000 mg | ORAL_TABLET | Freq: Every day | ORAL | 1 refills | Status: DC

## 2019-01-08 NOTE — Patient Instructions (Signed)
Stop by the lab prior to leaving today. I will notify you of your results once received.   Take the shingles vaccination to your pharmacy.  Ask your endocrinologist about the date of your last pneumonia vaccination.  We've increased the dose of your Zoloft to 100 mg. Start by taking 1 and 1/2 tablets daily of your 50 mg tablets until your bottle is empty. Only take one of the 100 mg tablets once received.   Start exercising. You should be getting 150 minutes of moderate intensity exercise weekly.  Continue to work on [employer]. Ensure you are consuming 64 ounces of water daily.  Please update me in four weeks regarding your Zoloft.  It was a pleasure to see you today!   Preventive Care 40-64 Years, Male Preventive care refers to lifestyle choices and visits with your health care provider that can promote health and wellness. What does preventive care include?   A yearly physical exam. This is also called an annual well check.  Dental exams once or twice a year.  Routine eye exams. Ask your health care provider how often you should have your eyes checked.  Personal lifestyle choices, including: ? Daily care of your teeth and gums. ? Regular physical activity. ? Eating a healthy diet. ? Avoiding tobacco and drug use. ? Limiting alcohol use. ? Practicing safe sex. ? Taking low-dose aspirin every day starting at age 78. What happens during an annual well check? The services and screenings done by your health care provider during your annual well check will depend on your age, overall health, lifestyle risk factors, and family history of disease. Counseling Your health care provider may ask you questions about your:  Alcohol use.  Tobacco use.  Drug use.  Emotional well-being.  Home and relationship well-being.  Sexual activity.  Eating habits.  Work and work Statistician. Screening You may have the following tests or measurements:  Height, weight, and  BMI.  Blood pressure.  Lipid and cholesterol levels. These may be checked every 5 years, or more frequently if you are over 62 years old.  Skin check.  Lung cancer screening. You may have this screening every year starting at age 89 if you have a 30-pack-year history of smoking and currently smoke or have quit within the past 15 years.  Colorectal cancer screening. All adults should have this screening starting at age 71 and continuing until age 26. Your health care provider may recommend screening at age 63. You will have tests every 1-10 years, depending on your results and the type of screening test. People at increased risk should start screening at an earlier age. Screening tests may include: ? Guaiac-based fecal occult blood testing. ? Fecal immunochemical test (FIT). ? Stool DNA test. ? Virtual colonoscopy. ? Sigmoidoscopy. During this test, a flexible tube with a tiny camera (sigmoidoscope) is used to examine your rectum and lower colon. The sigmoidoscope is inserted through your anus into your rectum and lower colon. ? Colonoscopy. During this test, a long, thin, flexible tube with a tiny camera (colonoscope) is used to examine your entire colon and rectum.  Prostate cancer screening. Recommendations will vary depending on your family history and other risks.  Hepatitis C blood test.  Hepatitis B blood test.  Sexually transmitted disease (STD) testing.  Diabetes screening. This is done by checking your blood sugar (glucose) after you have not eaten for a while (fasting). You may have this done every 1-3 years. Discuss your test results, treatment options,  and if necessary, the need for more tests with your health care provider. Vaccines Your health care provider may recommend certain vaccines, such as:  Influenza vaccine. This is recommended every year.  Tetanus, diphtheria, and acellular pertussis (Tdap, Td) vaccine. You may need a Td booster every 10 years.  Varicella  vaccine. You may need this if you have not been vaccinated.  Zoster vaccine. You may need this after age 56.  Measles, mumps, and rubella (MMR) vaccine. You may need at least one dose of MMR if you were born in 1957 or later. You may also need a second dose.  Pneumococcal 13-valent conjugate (PCV13) vaccine. You may need this if you have certain conditions and have not been vaccinated.  Pneumococcal polysaccharide (PPSV23) vaccine. You may need one or two doses if you smoke cigarettes or if you have certain conditions.  Meningococcal vaccine. You may need this if you have certain conditions.  Hepatitis A vaccine. You may need this if you have certain conditions or if you travel or work in places where you may be exposed to hepatitis A.  Hepatitis B vaccine. You may need this if you have certain conditions or if you travel or work in places where you may be exposed to hepatitis B.  Haemophilus influenzae type b (Hib) vaccine. You may need this if you have certain risk factors. Talk to your health care provider about which screenings and vaccines you need and how often you need them. This information is not intended to replace advice given to you by your health care provider. Make sure you discuss any questions you have with your health care provider. Document Released: 12/29/2015 Document Revised: 01/22/2018 Document Reviewed: 10/03/2015 Elsevier Interactive Patient Education  2019 [employer].

## 2019-01-08 NOTE — Assessment & Plan Note (Signed)
Recent optical migraine, will be setting up an appointment with neurologist. Also following with optometry.

## 2019-01-08 NOTE — Assessment & Plan Note (Addendum)
Repeat lipids pending. Recommended to work on regular exercise and limit fast food.  Continue simvastatin.

## 2019-01-08 NOTE — Assessment & Plan Note (Signed)
Stable in the office today, continue lisinopril 20 mg.  BMP pending.

## 2019-01-08 NOTE — Assessment & Plan Note (Signed)
Tetanus due, provided today. Influenza vaccination UTD. He believes his pneumonia vaccination is UTD, he will check with his endocrinologist. Rx for Shingrix provided today. Colonoscopy UTD. PSA UTD. Hep C screening pending. Recommended regular exercise, healthy diet. Exam unremarkable. Labs pending. Follow up in 1 year for CPE.

## 2019-01-08 NOTE — Assessment & Plan Note (Signed)
Overall improved on Zoloft 50 mg, does continue to have breakthrough anxiety with irritability.  Given that he's done well on Zoloft overall but continues to have breakthrough symptoms, we will increase the dose to 100 mg and he will update in four weeks.

## 2019-01-08 NOTE — Assessment & Plan Note (Signed)
Chronic, no changes over time. History of cardiac catheterizations x 2 which were both clear.  No new symptoms. Exam unremarkable today.

## 2019-01-08 NOTE — Progress Notes (Signed)
Subjective:    Patient ID: Martin Deleon, male    DOB: Mar 21, 1956, 63 y.o.   MRN: [id number]  HPI  Martin Deleon is a 63 year old male who presents today for complete physical.  Immunizations: -Tetanus: Completed over 10 years ago -Influenza: Completed this season  -Shingles: History of shingles, never completed the vaccination    Diet: He endorses a healthy diet Breakfast: Toast, fried egg; fast food occasionally  Lunch: Sandwiches; occasionally fast food/restaurants Dinner: Meat, vegetable, starch Snacks: Crackers  Desserts: None Beverages: Water, unsweet tea, occasional coffee  Exercise: He is active at work, is not exercising Eye exam: Due next month Dental exam: Completes semi-annually  Colonoscopy: Completed in 2019, due again in 2029 PSA: 2.12 in 2018 Hep C Screen: Due  BP Readings from Last 3 Encounters:  01/08/19 108/70  07/16/18 134/70  05/13/18 110/64     Review of Systems  Constitutional: Negative for unexpected weight change.  HENT: Negative for rhinorrhea.   Respiratory: Negative for cough and shortness of breath.   Cardiovascular: Negative for palpitations.  Gastrointestinal: Negative for constipation and diarrhea.  Genitourinary: Negative for difficulty urinating.  Musculoskeletal: Positive for arthralgias and neck pain.  Skin: Negative for rash.  Allergic/Immunologic: Negative for environmental allergies.  Neurological: Negative for dizziness, numbness and headaches.  Psychiatric/Behavioral: Negative for suicidal ideas.       Breakthrough anxiety symptoms on Zoloft 50 mg, overall has done better.        Past Medical History:  Diagnosis Date  . Anginal pain ([place])   . Anxiety   . Arthritis   . Diabetes mellitus without complication ([place])   . Headache   . Hypercholesteremia   . Hypertension   . Insulin pump in place   . Neuropathy due to secondary diabetes mellitus ([place])   . PONV (postoperative nausea and vomiting)      Social History    Socioeconomic History  . Marital status: Married    Spouse name: Not on file  . Number of children: Not on file  . Years of education: Not on file  . Highest education level: Not on file  Occupational History  . Not on file  Social Needs  . Financial resource strain: Not on file  . Food insecurity:    Worry: Not on file    Inability: Not on file  . Transportation needs:    Medical: Not on file    Non-medical: Not on file  Tobacco Use  . Smoking status: Former Smoker    Packs/day: 1.00    Types: Cigarettes    Last attempt to quit: 03/16/1997    Years since quitting: 21.8  . Smokeless tobacco: Never Used  Substance and Sexual Activity  . Alcohol use: No  . Drug use: Yes    Types: Marijuana  . Sexual activity: Not on file  Lifestyle  . Physical activity:    Days per week: Not on file    Minutes per session: Not on file  . Stress: Not on file  Relationships  . Social connections:    Talks on phone: Not on file    Gets together: Not on file    Attends religious service: Not on file    Active member of club or organization: Not on file    Attends meetings of clubs or organizations: Not on file    Relationship status: Not on file  . Intimate partner violence:    Fear of current or ex partner: Not on file  Emotionally abused: Not on file    Physically abused: Not on file    Forced sexual activity: Not on file  Other Topics Concern  . Not on file  Social History Narrative   Married.   2 children.   Works as a Nature conservation officer.   Enjoys golfing.     Past Surgical History:  Procedure Laterality Date  . APPENDECTOMY    . CARDIAC CATHETERIZATION    . CERVICAL FUSION  2005, 2006   [facility] Dr. Glenna Fellows  . COLONOSCOPY WITH PROPOFOL N/A 05/12/2018   Procedure: COLONOSCOPY WITH PROPOFOL;  Surgeon: Lucilla Lame, MD;  Location: [facility] ENDOSCOPY;  Service: Endoscopy;  Laterality: N/A;  . FRACTURE SURGERY Right August 05, 1986   Elbow  . TOTAL HIP ARTHROPLASTY Left  10/31/2015   Procedure: TOTAL HIP ARTHROPLASTY;  Surgeon: Christophe Louis, MD;  Location: [facility] ORS;  Service: Orthopedics;  Laterality: Left;    Family History  Problem Relation Age of Onset  . Heart disease Father   . Heart attack Father 61  . Penile cancer Father   . Arrhythmia Brother     Allergies  Allergen Reactions  . Codeine Nausea And Vomiting    Current Outpatient Medications on File Prior to Visit  Medication Sig Dispense Refill  . aspirin EC 81 MG tablet Take 81 mg by mouth daily.    Marland Kitchen gabapentin (NEURONTIN) 800 MG tablet Take 1 tablet (800 mg total) by mouth 3 (three) times daily. For neuropathic pain. 270 tablet 0  . Insulin Human (INSULIN PUMP) SOLN Inject into the skin every hour. Humalog Insulin Pump--patient stated he has five different rates depending on the time of day    . lisinopril (PRINIVIL,ZESTRIL) 20 MG tablet Take 20 mg by mouth daily.    . rizatriptan (MAXALT) 10 MG tablet Take 10 mg by mouth as needed for migraine. May repeat in 2 hours if needed    . simvastatin (ZOCOR) 40 MG tablet Take 40 mg by mouth daily at 6 PM.     . tiZANidine (ZANAFLEX) 4 MG tablet TAKE 1 TABLET BY MOUTH THREE TIMES DAILY AS NEEDED FOR MUSCLE SPASMS 270 tablet 0  . [DISCONTINUED] sertraline (ZOLOFT) 50 MG tablet Take 1 tablet (50 mg total) by mouth daily. 90 tablet 0   No current facility-administered medications on file prior to visit.     BP 108/70   Pulse 60   Temp 98.1 F (36.7 C) (Oral)   Ht 5\' 10"  (1.778 m)   Wt 170 lb (77.1 kg)   SpO2 98%   BMI 24.39 kg/m    Objective:   Physical Exam  Constitutional: He is oriented to person, place, and time. He appears well-nourished.  HENT:  Mouth/Throat: No oropharyngeal exudate.  Eyes: Pupils are equal, round, and reactive to light. EOM are normal.  Neck: Neck supple. No thyromegaly present.  Cardiovascular: Normal rate and regular rhythm.  Respiratory: Effort normal and breath sounds normal.  GI: Soft. Bowel sounds  are normal. There is no abdominal tenderness.  Musculoskeletal: Normal range of motion.  Neurological: He is alert and oriented to person, place, and time.  Skin: Skin is warm and dry.  Psychiatric: He has a normal mood and affect.           Assessment & Plan:

## 2019-01-08 NOTE — Assessment & Plan Note (Signed)
Overall stable on current regimen, following with endocrinology through [facility]. Continue current regimen.

## 2019-01-08 NOTE — Assessment & Plan Note (Addendum)
Chronic, will be meeting with neurosurgeon soon. Using gabapentin and tizanidine with improvement, continue same. Not currently thinking about surgical intervention at this time. History of four cervical surgeries.

## 2019-01-08 NOTE — Addendum Note (Signed)
Addended by: Jacqualin Combes on: 01/08/2019 10:26 AM   Modules accepted: Orders

## 2019-01-09 LAB — HEPATITIS C ANTIBODY
HEP C AB: NONREACTIVE
SIGNAL TO CUT-OFF: 0.01 (ref <1.00)

## 2019-02-18 ENCOUNTER — Encounter: Payer: Self-pay | Admitting: Primary Care

## 2019-02-18 ENCOUNTER — Ambulatory Visit (INDEPENDENT_AMBULATORY_CARE_PROVIDER_SITE_OTHER): Payer: Managed Care, Other (non HMO) | Admitting: Primary Care

## 2019-02-18 VITALS — BP 140/72 | HR 71 | Temp 98.4°F | Ht 70.0 in | Wt 168.5 lb

## 2019-02-18 DIAGNOSIS — D229 Melanocytic nevi, unspecified: Secondary | ICD-10-CM

## 2019-02-18 DIAGNOSIS — I10 Essential (primary) hypertension: Secondary | ICD-10-CM

## 2019-02-18 MED ORDER — LISINOPRIL 30 MG PO TABS: 30.0000 mg | ORAL_TABLET | Freq: Every day | ORAL | 0 refills | Status: DC

## 2019-02-18 NOTE — Progress Notes (Signed)
Subjective:    Patient ID: Martin Deleon, male    DOB: [birth year], 63 y.o.   MRN: [id number]  HPI  Martin Deleon is a 63 year old male with a history of type 1 diabetes, hypertension, migraines who presents today with a chief compliant of elevated blood pressure readings.  He also reports a fast growing mole to right base of neck.  1) Essential Hypertension: He is currently managed on lisinopril 20 mg for which he's been on for years. He's been checking his BP over the last 10 days and is getting readings of 120's-160's/60's-low 100's, mostly 140's/90's. He's also had symptoms of chest pain (chronic), dizziness, dull headache, nausea. He does have a cardiologist and plans to see him in May this year.  BP Readings from Last 3 Encounters:  02/18/19 140/72  01/08/19 108/70  07/16/18 134/70   2) Nevus: Numerous nevi to entire body.  Nevus to right base of lateral neck has doubled in size over the last 2 months also with changes in shape.  History of tobacco abuse with quit date of 1998.  He is concerned given changes in shape and size.  He has not seen dermatology in the past.  Review of Systems  Constitutional: Negative for fatigue.  Respiratory: Negative for shortness of breath.   Cardiovascular: Positive for chest pain. Negative for palpitations and leg swelling.  Gastrointestinal: Negative for nausea.  Neurological: Positive for dizziness and headaches.       Past Medical History:  Diagnosis Date  . Anginal pain ([place])   . Anxiety   . Arthritis   . Diabetes mellitus without complication ([place])   . Headache   . Hypercholesteremia   . Hypertension   . Insulin pump in place   . Neuropathy due to secondary diabetes mellitus ([place])   . PONV (postoperative nausea and vomiting)      Social History   Socioeconomic History  . Marital status: Married    Spouse name: Not on file  . Number of children: Not on file  . Years of education: Not on file  . Highest education level: Not  on file  Occupational History  . Not on file  Social Needs  . Financial resource strain: Not on file  . Food insecurity:    Worry: Not on file    Inability: Not on file  . Transportation needs:    Medical: Not on file    Non-medical: Not on file  Tobacco Use  . Smoking status: Former Smoker    Packs/day: 1.00    Types: Cigarettes    Last attempt to quit: 03/16/1997    Years since quitting: 21.9  . Smokeless tobacco: Never Used  Substance and Sexual Activity  . Alcohol use: No  . Drug use: Yes    Types: Marijuana  . Sexual activity: Not on file  Lifestyle  . Physical activity:    Days per week: Not on file    Minutes per session: Not on file  . Stress: Not on file  Relationships  . Social connections:    Talks on phone: Not on file    Gets together: Not on file    Attends religious service: Not on file    Active member of club or organization: Not on file    Attends meetings of clubs or organizations: Not on file    Relationship status: Not on file  . Intimate partner violence:    Fear of current or ex partner: Not on file  Emotionally abused: Not on file    Physically abused: Not on file    Forced sexual activity: Not on file  Other Topics Concern  . Not on file  Social History Narrative   Married.   2 children.   Works as a Nature conservation officer.   Enjoys golfing.     Past Surgical History:  Procedure Laterality Date  . APPENDECTOMY    . CARDIAC CATHETERIZATION    . CERVICAL FUSION  2005, 2006   [facility] Dr. Glenna Fellows  . COLONOSCOPY WITH PROPOFOL N/A 05/12/2018   Procedure: COLONOSCOPY WITH PROPOFOL;  Surgeon: Lucilla Lame, MD;  Location: [facility] ENDOSCOPY;  Service: Endoscopy;  Laterality: N/A;  . FRACTURE SURGERY Right August 05, 1986   [place]bow  . 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 Heart disease Father   . Heart attack Father 98  . Penile cancer Father   . Arrhythmia Brother     Allergies  Allergen Reactions  . Codeine Nausea And Vomiting    Current Outpatient Medications on File Prior to Visit  Medication Sig Dispense Refill  . aspirin EC 81 MG tablet Take 81 mg by mouth daily.    Marland Kitchen gabapentin (NEURONTIN) 800 MG tablet Take 1 tablet (800 mg total) by mouth 3 (three) times daily. For neuropathic pain. 270 tablet 0  . Insulin Human (INSULIN PUMP) SOLN Inject into the skin every hour. Humalog Insulin Pump--patient stated he has five different rates depending on the time of day    . rizatriptan (MAXALT) 10 MG tablet Take 10 mg by mouth as needed for migraine. May repeat in 2 hours if needed    . sertraline (ZOLOFT) 100 MG tablet Take 1 tablet (100 mg total) by mouth daily. 90 tablet 1  . simvastatin (ZOCOR) 40 MG tablet Take 40 mg by mouth daily at 6 PM.     . tiZANidine (ZANAFLEX) 4 MG tablet TAKE 1 TABLET BY MOUTH THREE TIMES DAILY AS NEEDED FOR MUSCLE SPASMS 270 tablet 0   No current facility-administered medications on file prior to visit.     BP 140/72   Pulse 71   Temp 98.4 F (36.9 C) (Oral)   Ht 5\' 10"  (1.778 m)   Wt 168 lb 8 oz (76.4 kg)   SpO2 96%   BMI 24.18 kg/m    Objective:   Physical Exam  Constitutional: He appears well-nourished.  Neck: Neck supple.  Cardiovascular: Normal rate and regular rhythm.  Respiratory: Effort normal and breath sounds normal.  Skin: Skin is warm and dry.  Kidney bean shaped nevus with jagged edges to right lateral neck.  Dark brown in color.  Mostly flat.  Psychiatric: He has a normal mood and affect.           Assessment & Plan:  Nevus:  Acute to right lateral neck with rapid growth over the last 2 months. Exam today with potential suspicion given shape and jagged edges. Referral placed to dermatology for further evaluation.  Pleas Koch, NP

## 2019-02-18 NOTE — Assessment & Plan Note (Signed)
Increase in blood pressure readings over the last 1 to 2 months with associated symptoms.  He does have chronic chest pain for which has been thoroughly evaluated by cardiology in the past.  Given symptoms coupled with elevated readings we will increase his lisinopril to 30 mg.  We discussed to continue monitor readings and to notify if he notices readings at or below 100/60.  We will plan to see him back in 2 3 weeks for blood pressure check.

## 2019-02-18 NOTE — Patient Instructions (Signed)
We've increased your lisinopril to 30 mg. Take 1 tablet daily.  Start monitoring your blood pressure daily, around the same time of day, for the next 2-3weeks.  Ensure that you have rested for 30 minutes prior to checking your blood pressure. Record your readings and bring them to your next visit.  We will see you in 2-3 weeks for blood pressure check.  It was a pleasure to see you today!

## 2019-02-22 ENCOUNTER — Other Ambulatory Visit: Payer: Self-pay | Admitting: Primary Care

## 2019-02-22 DIAGNOSIS — G8929 Other chronic pain: Secondary | ICD-10-CM

## 2019-02-22 DIAGNOSIS — M542 Cervicalgia: Principal | ICD-10-CM

## 2019-03-11 ENCOUNTER — Ambulatory Visit: Payer: Managed Care, Other (non HMO) | Admitting: Primary Care

## 2019-03-11 ENCOUNTER — Other Ambulatory Visit: Payer: Self-pay

## 2019-03-11 ENCOUNTER — Ambulatory Visit (INDEPENDENT_AMBULATORY_CARE_PROVIDER_SITE_OTHER): Payer: Managed Care, Other (non HMO) | Admitting: Primary Care

## 2019-03-11 DIAGNOSIS — I10 Essential (primary) hypertension: Secondary | ICD-10-CM | POA: Diagnosis not present

## 2019-03-11 MED ORDER — LISINOPRIL 30 MG PO TABS: 30.0000 mg | ORAL_TABLET | Freq: Every day | ORAL | 3 refills | Status: DC

## 2019-03-11 NOTE — Patient Instructions (Signed)
Continue taking lisinopril 30 mg tablets daily for blood pressure.  I sent refills to [place].  Nice to speak with you! Allie Bossier, NP-C

## 2019-03-11 NOTE — Assessment & Plan Note (Addendum)
Improved with increased dose of lisinopril at 30 mg.  Home BP readings of 120-130's/70's-80's. Continue lisinopril 30 mg daily, refill sent to pharmacy. BMP next visit in office.

## 2019-03-11 NOTE — Progress Notes (Signed)
Subjective:    Patient ID: Martin Deleon, male    DOB: 1956-09-01, 63 y.o.   MRN: [id number]  HPI     Martin Deleon - 63 y.o. male  MRN [id number]  Date of Birth: 1956/09/11  PCP: Pleas Koch, NP  This service was provided via telemedicine. Phone Visit performed on 03/11/2019    Rationale for phone visit reviewed. Patient consented to telephone encounter.    Location of patient: Home Location of provider: Office at [employer] @ [facility] Name of referring provider: N/A   Names of persons and role in encounter: Provider: Pleas Koch, NP  Patient: Martin Deleon  Other: N/A   Time on call: 5 minutes and 29 seconds   Subjective: CC: Follow up of hypertension HPI:  Martin Deleon is a 63 year old male with a history of type 1 diabetes, hypertension, anxiety and depression who presents today via phone for follow up of hypertension. He was last evaluated in the office on 02/18/19 for increased BP readings over the last 1-2 months. His BP in the office was elevated so his lisinopril was increased to 30 mg. He was asked to follow up today but in light of Covid-19 this visit is being conducted via phone.  Today on the phone he endorses home blood pressure readings of 120's-130's/70's-80's. He's been self quarantined since 03/03/19. He denies dizziness, chest pain, headaches, cough.    Objective/Observations:   No physical exam or vital signs collected unless specifically identified.  Respiratory status: speaks in complete sentences without evident shortness of breath.   Assessment/Plan:  No problem-specific Assessment & Plan notes found for this encounter.   I discussed the assessment and treatment plan with the patient. The patient was provided an opportunity to ask questions and all were answered. The patient agreed with the plan and demonstrated an understanding of the instructions.  Lab Orders  No laboratory test(s) ordered today    No orders of the  defined types were placed in this encounter.   The patient was advised to call back or seek an in-person evaluation if the symptoms worsen or if the condition fails to improve as anticipated.  Pleas Koch, NP    Review of Systems  Eyes: Negative for visual disturbance.  Respiratory: Negative for cough and shortness of breath.   Cardiovascular: Negative for chest pain.  Neurological: Negative for dizziness and headaches.       Past Medical History:  Diagnosis Date  . Anginal pain ([place])   . Anxiety   . Arthritis   . Diabetes mellitus without complication ([place])   . Headache   . Hypercholesteremia   . Hypertension   . Insulin pump in place   . Neuropathy due to secondary diabetes mellitus ([place])   . PONV (postoperative nausea and vomiting)      Social History   Socioeconomic History  . Marital status: Married    Spouse name: Not on file  . Number of children: Not on file  . Years of education: Not on file  . Highest education level: Not on file  Occupational History  . Not on file  Social Needs  . Financial resource strain: Not on file  . Food insecurity:    Worry: Not on file    Inability: Not on file  . Transportation needs:    Medical: Not on file    Non-medical: Not on file  Tobacco Use  . Smoking status: Former Smoker  Packs/day: 1.00    Types: Cigarettes    Last attempt to quit: 03/16/1997    Years since quitting: 22.0  . Smokeless tobacco: Never Used  Substance and Sexual Activity  . Alcohol use: No  . Drug use: Yes    Types: Marijuana  . Sexual activity: Not on file  Lifestyle  . Physical activity:    Days per week: Not on file    Minutes per session: Not on file  . Stress: Not on file  Relationships  . Social connections:    Talks on phone: Not on file    Gets together: Not on file    Attends religious service: Not on file    Active member of club or organization: Not on file    Attends meetings of clubs or organizations: Not on file     Relationship status: Not on file  . Intimate partner violence:    Fear of current or ex partner: Not on file    Emotionally abused: Not on file    Physically abused: Not on file    Forced sexual activity: Not on file  Other Topics Concern  . Not on file  Social History Narrative   Married.   2 children.   Works as a Nature conservation officer.   Enjoys golfing.     Past Surgical History:  Procedure Laterality Date  . APPENDECTOMY    . CARDIAC CATHETERIZATION    . CERVICAL FUSION  2005, 2006   [facility] Dr. Glenna Fellows  . COLONOSCOPY WITH PROPOFOL N/A 05/12/2018   Procedure: COLONOSCOPY WITH PROPOFOL;  Surgeon: Lucilla Lame, MD;  Location: [facility] ENDOSCOPY;  Service: Endoscopy;  Laterality: N/A;  . FRACTURE SURGERY Right August 05, 1986   Elbow  . 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 Heart disease Father   . Heart attack Father 79  . Penile cancer Father   . Arrhythmia Brother     Allergies  Allergen Reactions  . Codeine Nausea And Vomiting    Current Outpatient Medications on File Prior to Visit  Medication Sig Dispense Refill  . aspirin EC 81 MG tablet Take 81 mg by mouth daily.    Marland Kitchen gabapentin (NEURONTIN) 800 MG tablet Take 1 tablet (800 mg total) by mouth 3 (three) times daily. For neuropathic pain. 270 tablet 0  . Insulin Human (INSULIN PUMP) SOLN Inject into the skin every hour. Humalog Insulin Pump--patient stated he has five different rates depending on the time of day    . rizatriptan (MAXALT) 10 MG tablet Take 10 mg by mouth as needed for migraine. May repeat in 2 hours if needed    . sertraline (ZOLOFT) 100 MG tablet Take 1 tablet (100 mg total) by mouth daily. 90 tablet 1  . simvastatin (ZOCOR) 40 MG tablet Take 40 mg by mouth daily at 6 PM.     . tiZANidine (ZANAFLEX) 4 MG tablet TAKE 1  TABLET BY MOUTH THREE TIMES DAILY AS NEEDED FOR MUSCLE SPASMS 270 tablet 0   No current facility-administered medications on file prior to visit.     There were no vitals taken for this visit.   Objective:   Physical Exam  Constitutional: He is oriented to person, place, and time.  Respiratory: Effort normal.  Neurological: He is alert and oriented to person, place,  and time.  Psychiatric: He has a normal mood and affect.           Assessment & Plan:

## 2019-04-26 DIAGNOSIS — R110 Nausea: Secondary | ICD-10-CM

## 2019-04-26 MED ORDER — PROMETHAZINE HCL 12.5 MG PO TABS: 12.5000 mg | ORAL_TABLET | Freq: Three times a day (TID) | ORAL | 0 refills | Status: DC | PRN

## 2019-05-05 DIAGNOSIS — F419 Anxiety disorder, unspecified: Secondary | ICD-10-CM

## 2019-05-05 DIAGNOSIS — F329 Major depressive disorder, single episode, unspecified: Secondary | ICD-10-CM

## 2019-05-05 MED ORDER — SERTRALINE HCL 100 MG PO TABS: 100.0000 mg | ORAL_TABLET | Freq: Every day | ORAL | 1 refills | Status: DC

## 2019-06-10 DIAGNOSIS — G629 Polyneuropathy, unspecified: Secondary | ICD-10-CM

## 2019-06-10 MED ORDER — GABAPENTIN 800 MG PO TABS: 800.0000 mg | ORAL_TABLET | Freq: Three times a day (TID) | ORAL | 0 refills | Status: DC

## 2019-06-11 ENCOUNTER — Ambulatory Visit (INDEPENDENT_AMBULATORY_CARE_PROVIDER_SITE_OTHER): Payer: Managed Care, Other (non HMO) | Admitting: Primary Care

## 2019-06-11 ENCOUNTER — Encounter: Payer: Self-pay | Admitting: Primary Care

## 2019-06-11 DIAGNOSIS — E1040 Type 1 diabetes mellitus with diabetic neuropathy, unspecified: Secondary | ICD-10-CM | POA: Diagnosis not present

## 2019-06-11 DIAGNOSIS — F419 Anxiety disorder, unspecified: Secondary | ICD-10-CM | POA: Diagnosis not present

## 2019-06-11 DIAGNOSIS — F329 Major depressive disorder, single episode, unspecified: Secondary | ICD-10-CM | POA: Diagnosis not present

## 2019-06-11 DIAGNOSIS — I10 Essential (primary) hypertension: Secondary | ICD-10-CM

## 2019-06-11 DIAGNOSIS — F32A Depression, unspecified: Secondary | ICD-10-CM

## 2019-06-11 NOTE — Assessment & Plan Note (Signed)
Following with endocrinology, continue current regimen. Endocrinology has him on FMLA leave for now due to his high risk for contracting Covid.

## 2019-06-11 NOTE — Assessment & Plan Note (Signed)
Stable on current regimen. Continue same. 

## 2019-06-11 NOTE — Assessment & Plan Note (Signed)
Initially increased with thoughts of having to return to work, now greatly reduced as his FMLA has been extended.   Continue current regimen. He will update.

## 2019-06-11 NOTE — Patient Instructions (Signed)
Continue to monitor your blood pressure.   Continue sertraline 100 mg for anxiety. Please update me if your anxiety increases.   It was a pleasure to see you today!

## 2019-06-11 NOTE — Progress Notes (Signed)
Subjective:    Patient ID: Martin Deleon, male    DOB: 1956/03/31, 63 y.o.   MRN: [id number]  HPI  Virtual Visit via Video Note  I connected with Martin Deleon on 06/11/19 at  2:20 PM EDT by a video enabled telemedicine application and verified that I am speaking with the correct person using two identifiers.  Location: Patient: Home Provider: Office   I discussed the limitations of evaluation and management by telemedicine and the availability of in person appointments. The patient expressed understanding and agreed to proceed.  History of Present Illness:  Martin Deleon is a 63 year old male with a history of type 1 diabetes, hypertension, migraines, anxiety and depression who presents today to discuss anxiety.  He has been out of work since March 17th per his endocrinologist due to his history of type 1 diabetes and high risk for Covid-19. He works in Press [employer] and is nearly always in [employer] of others. He was scheduled to return to work on June 1st, but was extended until June 18th. He has since been excused from work until further notice through [facility] per his endocrinologist. He hardly leaves his house.  He doesn't feel safe at work due to recent several positive cases at work, also given his chronic medical conditions. Prior to his return to work date he experienced an increased amount of anxiety with symptoms of nausea, panic attacks, sweats, weight loss, dry heaves, difficulty sleeping. Since his leave from work has been extended his anxiety symptoms have drastically improved. Overall he's doing well and just wanted to follow up.   Observations/Objective:  Alert and oriented. Appears well, not sickly. No distress. Speaking in complete sentences.   Assessment and Plan:  See problem based charting.  Follow Up Instructions:  Continue to monitor your blood pressure.   Continue sertraline 100 mg for anxiety. Please update me if your anxiety increases.   It was a  pleasure to see you today!    I discussed the assessment and treatment plan with the patient. The patient was provided an opportunity to ask questions and all were answered. The patient agreed with the plan and demonstrated an understanding of the instructions.   The patient was advised to call back or seek an in-person evaluation if the symptoms worsen or if the condition fails to improve as anticipated.     Pleas Koch, NP    Review of Systems  Respiratory: Negative for shortness of breath.   Cardiovascular: Negative for chest pain.  Psychiatric/Behavioral:       Anxiety reduced overall.        Past Medical History:  Diagnosis Date  . Anginal pain ([place])   . Anxiety   . Arthritis   . Diabetes mellitus without complication ([place])   . Headache   . Hypercholesteremia   . Hypertension   . Insulin pump in place   . Neuropathy due to secondary diabetes mellitus ([place])   . PONV (postoperative nausea and vomiting)      Social History   Socioeconomic History  . Marital status: Married    Spouse name: Not on file  . Number of children: Not on file  . Years of education: Not on file  . Highest education level: Not on file  Occupational History  . Not on file  Social Needs  . Financial resource strain: Not on file  . Food insecurity    Worry: Not on file    Inability: Not on file  .  Transportation needs    Medical: Not on file    Non-medical: Not on file  Tobacco Use  . Smoking status: Former Smoker    Packs/day: 1.00    Types: Cigarettes    Quit date: 03/16/1997    Years since quitting: 22.2  . Smokeless tobacco: Never Used  Substance and Sexual Activity  . Alcohol use: No  . Drug use: Yes    Types: Marijuana  . Sexual activity: Not on file  Lifestyle  . Physical activity    Days per week: Not on file    Minutes per session: Not on file  . Stress: Not on file  Relationships  . Social Herbalist on phone: Not on file    Gets together: Not on  file    Attends religious service: Not on file    Active member of club or organization: Not on file    Attends meetings of clubs or organizations: Not on file    Relationship status: Not on file  . Intimate partner violence    Fear of current or ex partner: Not on file    Emotionally abused: Not on file    Physically abused: Not on file    Forced sexual activity: Not on file  Other Topics Concern  . Not on file  Social History Narrative   Married.   2 children.   Works as a Nature conservation officer.   Enjoys golfing.     Past Surgical History:  Procedure Laterality Date  . APPENDECTOMY    . CARDIAC CATHETERIZATION    . CERVICAL FUSION  2005, 2006   [facility] Dr. Glenna Fellows  . COLONOSCOPY WITH PROPOFOL N/A 05/12/2018   Procedure: COLONOSCOPY WITH PROPOFOL;  Surgeon: Lucilla Lame, MD;  Location: [facility] ENDOSCOPY;  Service: Endoscopy;  Laterality: N/A;  . FRACTURE SURGERY Right August 05, 1986   [place]bow  . 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 Heart disease Father   . Heart attack Father 66  . Penile cancer Father   . Arrhythmia Brother     Allergies  Allergen Reactions  . Codeine Nausea And Vomiting    Current Outpatient Medications on File Prior to Visit  Medication Sig Dispense Refill  . aspirin EC 81 MG tablet Take 81 mg by mouth daily.    Marland Kitchen gabapentin (NEURONTIN) 800 MG tablet Take 1 tablet (800 mg total) by mouth 3 (three) times daily. For neuropathic pain. 270 tablet 0  . Insulin Human (INSULIN PUMP) SOLN Inject into the skin every hour. Humalog Insulin Pump--patient stated he has five different rates depending on the time of day    . lisinopril (PRINIVIL,ZESTRIL) 30 MG tablet Take 1 tablet (30 mg total) by mouth daily. For blood pressure. 90 tablet 3  . promethazine (PHENERGAN) 12.5 MG tablet  Take 1-2 tablets (12.5-25 mg total) by mouth every 8 (eight) hours as needed for nausea or vomiting. 20 tablet 0  . rizatriptan (MAXALT) 10 MG tablet Take 10 mg by mouth as needed for migraine. May repeat in 2 hours if needed    . sertraline (ZOLOFT) 100 MG tablet Take 1 tablet (100 mg total) by mouth daily. 90 tablet 1  . simvastatin (ZOCOR) 40 MG tablet Take 40 mg by mouth daily at 6 PM.     .  tiZANidine (ZANAFLEX) 4 MG tablet TAKE 1 TABLET BY MOUTH THREE TIMES DAILY AS NEEDED FOR MUSCLE SPASMS 270 tablet 0   No current facility-administered medications on file prior to visit.     BP 131/84   Pulse 88   Wt 170 lb (77.1 kg)   BMI 24.39 kg/m    Objective:   Physical Exam  Constitutional: He is oriented to person, place, and time. He appears well-nourished.  Respiratory: Effort normal. No respiratory distress.  Neurological: He is alert and oriented to person, place, and time.  Psychiatric: He has a normal mood and affect.           Assessment & Plan:

## 2019-06-24 NOTE — Telephone Encounter (Signed)
Martin Deleon, I sent patient a message advising him to call our office to be scheduled for back pain if he is still having issues with this. I also told him I would send you this message for any other recommendations or comments. Thank you

## 2019-06-24 NOTE — Telephone Encounter (Signed)
Pt left v/m requesting cb to schedule appt.

## 2019-06-24 NOTE — Telephone Encounter (Signed)
I spoke with pt and for 2 wks he has constant,dull rt lower back pain; pain level now is 7. No burning or pain when urinates; pt does not void as often as usual and seems to void smaller amts. No blood seen but has strong ammonia odor to urine. Pt said endo recommended to pt to see PCP. I offered pt an appt today with another provider but pt wants to see Martin Fitz NP. Pt has in office visit with Martin Fitz NP on 06/25/19 at 8 AM. ED precautions given.no covid symptoms, no travel and no known exposure to +covid.

## 2019-06-25 ENCOUNTER — Ambulatory Visit (INDEPENDENT_AMBULATORY_CARE_PROVIDER_SITE_OTHER)
Admission: RE | Admit: 2019-06-25 | Discharge: 2019-06-25 | Disposition: A | Discharge status: Home / Self Care | Payer: Self-pay | Source: Ambulatory Visit | Attending: Primary Care | Admitting: Primary Care

## 2019-06-25 ENCOUNTER — Ambulatory Visit (INDEPENDENT_AMBULATORY_CARE_PROVIDER_SITE_OTHER): Payer: Self-pay | Admitting: Primary Care

## 2019-06-25 ENCOUNTER — Encounter: Payer: Self-pay | Admitting: Primary Care

## 2019-06-25 ENCOUNTER — Other Ambulatory Visit: Payer: Self-pay

## 2019-06-25 VITALS — BP 118/70 | HR 55 | Temp 98.0°F | Ht 70.0 in | Wt 160.8 lb

## 2019-06-25 DIAGNOSIS — R109 Unspecified abdominal pain: Secondary | ICD-10-CM

## 2019-06-25 DIAGNOSIS — R3915 Urgency of urination: Secondary | ICD-10-CM

## 2019-06-25 LAB — POC URINALSYSI DIPSTICK (AUTOMATED)
Bilirubin, UA: NEGATIVE
Blood, UA: NEGATIVE
Glucose, UA: POSITIVE — AB
Ketones, UA: NEGATIVE
Leukocytes, UA: NEGATIVE
Nitrite, UA: NEGATIVE
Protein, UA: NEGATIVE
Spec Grav, UA: 1.015 (ref 1.010–1.025)
Urobilinogen, UA: 0.2 E.U./dL
pH, UA: 6 (ref 5.0–8.0)

## 2019-06-25 NOTE — Progress Notes (Signed)
Subjective:    Patient ID: Martin Deleon, male    DOB: 01-Apr-1956, 63 y.o.   MRN: [id number]  HPI  Mr. Hensley is a 63 year old male with a history of hypertension, type 1 diabetes, osteoarthritis, weak urinary stream, kidney stones who presents today with a chief complaint of back pain.  His pain is located to the right lower back which began about two weeks ago. His pain does not radiate across his back, around to the groin, down his right lower extremity. Pain is not worse with movement. He describes his pain as "achy".   He also endorses urinary urgency, foul smelling urine, nausea, weaker urinary stream. He denies hematuria, penile discharge. He endorses drinking 8-10 glasses of water daily.   He's taken his Tizanidine and Tylenol with temporary improvement.   Review of Systems  Constitutional: Negative for fever.  Gastrointestinal: Positive for nausea. Negative for abdominal pain and vomiting.  Genitourinary: Positive for difficulty urinating, flank pain, frequency and urgency. Negative for dysuria, hematuria, penile pain, scrotal swelling and testicular pain.  Musculoskeletal: Positive for back pain.       Past Medical History:  Diagnosis Date  . Anginal pain ([place])   . Anxiety   . Arthritis   . Diabetes mellitus without complication ([place])   . Headache   . Hypercholesteremia   . Hypertension   . Insulin pump in place   . Neuropathy due to secondary diabetes mellitus ([place])   . PONV (postoperative nausea and vomiting)      Social History   Socioeconomic History  . Marital status: Married    Spouse name: Not on file  . Number of children: Not on file  . Years of education: Not on file  . Highest education level: Not on file  Occupational History  . Not on file  Social Needs  . Financial resource strain: Not on file  . Food insecurity    Worry: Not on file    Inability: Not on file  . Transportation needs    Medical: Not on file    Non-medical: Not on file   Tobacco Use  . Smoking status: Former Smoker    Packs/day: 1.00    Types: Cigarettes    Quit date: 03/16/1997    Years since quitting: 22.2  . Smokeless tobacco: Never Used  Substance and Sexual Activity  . Alcohol use: No  . Drug use: Yes    Types: Marijuana  . Sexual activity: Not on file  Lifestyle  . Physical activity    Days per week: Not on file    Minutes per session: Not on file  . Stress: Not on file  Relationships  . Social Herbalist on phone: Not on file    Gets together: Not on file    Attends religious service: Not on file    Active member of club or organization: Not on file    Attends meetings of clubs or organizations: Not on file    Relationship status: Not on file  . Intimate partner violence    Fear of current or ex partner: Not on file    Emotionally abused: Not on file    Physically abused: Not on file    Forced sexual activity: Not on file  Other Topics Concern  . Not on file  Social History Narrative   Married.   2 children.   Works as a Nature conservation officer.   Enjoys golfing.     Past  Surgical History:  Procedure Laterality Date  . APPENDECTOMY    . CARDIAC CATHETERIZATION    . CERVICAL FUSION  2005, 2006   [facility] Dr. Glenna Fellows  . COLONOSCOPY WITH PROPOFOL N/A 05/12/2018   Procedure: COLONOSCOPY WITH PROPOFOL;  Surgeon: Lucilla Lame, MD;  Location: [facility] ENDOSCOPY;  Service: Endoscopy;  Laterality: N/A;  . FRACTURE SURGERY Right August 05, 1986   Elbow  . 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 Heart disease Father   . Heart attack Father 1  . Penile cancer Father   . Arrhythmia Brother     Allergies  Allergen Reactions  . Codeine Nausea And Vomiting    Current Outpatient Medications on File Prior to Visit  Medication Sig Dispense Refill   . aspirin EC 81 MG tablet Take 81 mg by mouth daily.    Marland Kitchen gabapentin (NEURONTIN) 800 MG tablet Take 1 tablet (800 mg total) by mouth 3 (three) times daily. For neuropathic pain. 270 tablet 0  . Insulin Human (INSULIN PUMP) SOLN Inject into the skin every hour. Humalog Insulin Pump--patient stated he has five different rates depending on the time of day    . lisinopril (PRINIVIL,ZESTRIL) 30 MG tablet Take 1 tablet (30 mg total) by mouth daily. For blood pressure. 90 tablet 3  . promethazine (PHENERGAN) 12.5 MG tablet Take 1-2 tablets (12.5-25 mg total) by mouth every 8 (eight) hours as needed for nausea or vomiting. 20 tablet 0  . rizatriptan (MAXALT) 10 MG tablet Take 10 mg by mouth as needed for migraine. May repeat in 2 hours if needed    . sertraline (ZOLOFT) 100 MG tablet Take 1 tablet (100 mg total) by mouth daily. 90 tablet 1  . simvastatin (ZOCOR) 40 MG tablet Take 40 mg by mouth daily at 6 PM.     . tiZANidine (ZANAFLEX) 4 MG tablet TAKE 1 TABLET BY MOUTH THREE TIMES DAILY AS NEEDED FOR MUSCLE SPASMS 270 tablet 0   No current facility-administered medications on file prior to visit.     BP 118/70   Pulse (!) 55   Temp 98 F (36.7 C) (Temporal)   Ht 5\' 10"  (1.778 m)   Wt 160 lb 12 oz (72.9 kg)   SpO2 98%   BMI 23.07 kg/m    Objective:   Physical Exam  Constitutional: He appears well-nourished. He does not have a sickly appearance. He does not appear ill.  GI: Soft. Normal appearance and bowel sounds are normal. There is no abdominal tenderness.  Skin: Skin is warm and dry.  Psychiatric: He has a normal mood and affect.           Assessment & Plan:

## 2019-06-25 NOTE — Assessment & Plan Note (Addendum)
Right lower flank/upper lumbar region pain x 2 weeks. Also with urinary symptoms.  Given history of renal stones coupled with location of pain and urinary symptoms, will send for STAT renal stone scan.   Could be MSK causing symptoms but given history and symptoms we will need to rule out renal stone cause.   UA today with negative leuks/nitrites/blood. 3+ glucose.

## 2019-06-25 NOTE — Patient Instructions (Signed)
Stop by the front desk and speak with [facility] regarding your CT scan.  You can take [phone] mg of Tylenol every 8 hours as needed for pain.  Use the Tizanidine as needed for muscle relaxation.   I'll be in touch with your results once received.  It was a pleasure to see you today!

## 2019-06-30 DIAGNOSIS — F329 Major depressive disorder, single episode, unspecified: Secondary | ICD-10-CM

## 2019-06-30 DIAGNOSIS — F32A Depression, unspecified: Secondary | ICD-10-CM

## 2019-07-15 DIAGNOSIS — R109 Unspecified abdominal pain: Secondary | ICD-10-CM

## 2019-07-15 MED ORDER — DICLOFENAC SODIUM 75 MG PO TBEC: 75.0000 mg | DELAYED_RELEASE_TABLET | Freq: Two times a day (BID) | ORAL | 0 refills | Status: DC | PRN

## 2019-07-22 ENCOUNTER — Ambulatory Visit: Payer: Self-pay | Admitting: Psychology

## 2019-09-15 DIAGNOSIS — R109 Unspecified abdominal pain: Secondary | ICD-10-CM

## 2019-09-16 MED ORDER — DICLOFENAC SODIUM 75 MG PO TBEC: 75.0000 mg | DELAYED_RELEASE_TABLET | Freq: Two times a day (BID) | ORAL | 0 refills | Status: DC | PRN

## 2019-11-06 ENCOUNTER — Other Ambulatory Visit: Payer: Self-pay | Admitting: Primary Care

## 2019-11-06 DIAGNOSIS — F419 Anxiety disorder, unspecified: Secondary | ICD-10-CM

## 2019-11-06 DIAGNOSIS — F329 Major depressive disorder, single episode, unspecified: Secondary | ICD-10-CM

## 2019-11-06 DIAGNOSIS — F32A Depression, unspecified: Secondary | ICD-10-CM

## 2019-12-17 ENCOUNTER — Other Ambulatory Visit: Payer: Self-pay | Admitting: Primary Care

## 2019-12-17 DIAGNOSIS — G629 Polyneuropathy, unspecified: Secondary | ICD-10-CM

## 2019-12-20 NOTE — Telephone Encounter (Signed)
There is a Pharmacist, community message requesting this.  Last prescribed on 06/10/2019. Last see on 06/25/2019 (acute). No future appointment

## 2019-12-20 NOTE — Telephone Encounter (Signed)
Refill sent to pharmacy.   

## 2019-12-21 ENCOUNTER — Other Ambulatory Visit: Payer: Self-pay | Admitting: Primary Care

## 2019-12-21 DIAGNOSIS — R110 Nausea: Secondary | ICD-10-CM

## 2019-12-21 DIAGNOSIS — R109 Unspecified abdominal pain: Secondary | ICD-10-CM

## 2019-12-23 ENCOUNTER — Ambulatory Visit: Payer: BC Managed Care – PPO | Admitting: Primary Care

## 2019-12-28 ENCOUNTER — Other Ambulatory Visit: Payer: Self-pay | Admitting: Primary Care

## 2019-12-28 DIAGNOSIS — R110 Nausea: Secondary | ICD-10-CM

## 2019-12-28 NOTE — Telephone Encounter (Signed)
Refill sent to pharmacy.   

## 2019-12-28 NOTE — Telephone Encounter (Signed)
Last prescribed on 04/26/2019 . Last appointment on 06/25/2019. No future appointment

## 2020-01-20 ENCOUNTER — Other Ambulatory Visit: Payer: Self-pay

## 2020-01-20 ENCOUNTER — Ambulatory Visit (INDEPENDENT_AMBULATORY_CARE_PROVIDER_SITE_OTHER): Payer: BC Managed Care – PPO | Admitting: Primary Care

## 2020-01-20 VITALS — BP 124/84 | HR 82 | Temp 96.6°F | Ht 70.0 in | Wt 162.2 lb

## 2020-01-20 DIAGNOSIS — M159 Polyosteoarthritis, unspecified: Secondary | ICD-10-CM

## 2020-01-20 DIAGNOSIS — Z125 Encounter for screening for malignant neoplasm of prostate: Secondary | ICD-10-CM

## 2020-01-20 DIAGNOSIS — I10 Essential (primary) hypertension: Secondary | ICD-10-CM | POA: Diagnosis not present

## 2020-01-20 DIAGNOSIS — M542 Cervicalgia: Secondary | ICD-10-CM

## 2020-01-20 DIAGNOSIS — G43701 Chronic migraine without aura, not intractable, with status migrainosus: Secondary | ICD-10-CM

## 2020-01-20 DIAGNOSIS — E785 Hyperlipidemia, unspecified: Secondary | ICD-10-CM | POA: Diagnosis not present

## 2020-01-20 DIAGNOSIS — R0789 Other chest pain: Secondary | ICD-10-CM

## 2020-01-20 DIAGNOSIS — E1040 Type 1 diabetes mellitus with diabetic neuropathy, unspecified: Secondary | ICD-10-CM | POA: Diagnosis not present

## 2020-01-20 DIAGNOSIS — F419 Anxiety disorder, unspecified: Secondary | ICD-10-CM

## 2020-01-20 DIAGNOSIS — M8949 Other hypertrophic osteoarthropathy, multiple sites: Secondary | ICD-10-CM

## 2020-01-20 DIAGNOSIS — R110 Nausea: Secondary | ICD-10-CM

## 2020-01-20 DIAGNOSIS — F32A Depression, unspecified: Secondary | ICD-10-CM

## 2020-01-20 DIAGNOSIS — Z0000 Encounter for general adult medical examination without abnormal findings: Secondary | ICD-10-CM

## 2020-01-20 DIAGNOSIS — G8929 Other chronic pain: Secondary | ICD-10-CM

## 2020-01-20 DIAGNOSIS — F329 Major depressive disorder, single episode, unspecified: Secondary | ICD-10-CM

## 2020-01-20 LAB — LIPID PANEL
Cholesterol: 145 mg/dL (ref 0–200)
HDL: 57.1 mg/dL (ref >39.00)
LDL Cholesterol: 79 mg/dL (ref 0–99)
NonHDL: 87.58
Total CHOL/HDL Ratio: 3
Triglycerides: 45 mg/dL (ref 0.0–149.0)
VLDL: 9 mg/dL (ref 0.0–40.0)

## 2020-01-20 LAB — COMPREHENSIVE METABOLIC PANEL
ALT: 15 U/L (ref 0–53)
AST: 18 U/L (ref 0–37)
Albumin: 4.1 g/dL (ref 3.5–5.2)
Alkaline Phosphatase: 73 U/L (ref 39–117)
BUN: 18 mg/dL (ref 6–23)
CO2: 28 mEq/L (ref 19–32)
Calcium: 9 mg/dL (ref 8.4–10.5)
Chloride: 100 mEq/L (ref 96–112)
Creatinine, Ser: 0.86 mg/dL (ref 0.40–1.50)
GFR: 90.01 mL/min (ref >60.00)
Glucose, Bld: 250 mg/dL — ABNORMAL HIGH (ref 70–99)
Potassium: 5 mEq/L (ref 3.5–5.1)
Sodium: 134 mEq/L — ABNORMAL LOW (ref 135–145)
Total Bilirubin: 0.6 mg/dL (ref 0.2–1.2)
Total Protein: 6.2 g/dL (ref 6.0–8.3)

## 2020-01-20 LAB — HEMOGLOBIN A1C: Hgb A1c MFr Bld: 8.9 % — ABNORMAL HIGH (ref 4.6–6.5)

## 2020-01-20 LAB — PSA: PSA: 1.84 ng/mL (ref 0.10–4.00)

## 2020-01-20 NOTE — Assessment & Plan Note (Addendum)
Using promethazine PRN during migraines. Continue same.

## 2020-01-20 NOTE — Patient Instructions (Signed)
Stop by the lab prior to leaving today. I will notify you of your results once received.   Schedule a nurse visit for your pneumonia vaccination for 2-3 weeks after your last Covid-19 vaccine.  Schedule nurse visits for your Shingrix vaccinations one month (or later) after your pneumonia vaccination. The second Shingrix dose is due 2-6 months after the first.  Continue to work on a healthy diet. Ensure you are consuming 64 ounces of water daily.  It was a pleasure to see you today!   Preventive Care 58-85 Years Old, Male Preventive care refers to lifestyle choices and visits with your health care provider that can promote health and wellness. This includes:  A yearly physical exam. This is also called an annual well check.  Regular dental and eye exams.  Immunizations.  Screening for certain conditions.  Healthy lifestyle choices, such as eating a healthy diet, getting regular exercise, not using drugs or products that contain nicotine and tobacco, and limiting alcohol use. What can I expect for my preventive care visit? Physical exam Your health care provider will check:  Height and weight. These may be used to calculate body mass index (BMI), which is a measurement that tells if you are at a healthy weight.  Heart rate and blood pressure.  Your skin for abnormal spots. Counseling Your health care provider may ask you questions about:  Alcohol, tobacco, and drug use.  Emotional well-being.  Home and relationship well-being.  Sexual activity.  Eating habits.  Work and work Statistician. What immunizations do I need?  Influenza (flu) vaccine  This is recommended every year. Tetanus, diphtheria, and pertussis (Tdap) vaccine  You may need a Td booster every 10 years. Varicella (chickenpox) vaccine  You may need this vaccine if you have not already been vaccinated. Zoster (shingles) vaccine  You may need this after age 64. Measles, mumps, and rubella (MMR)  vaccine  You may need at least one dose of MMR if you were born in 1956 or later. You may also need a second dose. Pneumococcal conjugate (PCV13) vaccine  You may need this if you have certain conditions and were not previously vaccinated. Pneumococcal polysaccharide (PPSV23) vaccine  You may need one or two doses if you smoke cigarettes or if you have certain conditions. Meningococcal conjugate (MenACWY) vaccine  You may need this if you have certain conditions. Hepatitis A vaccine  You may need this if you have certain conditions or if you travel or work in places where you may be exposed to hepatitis A. Hepatitis B vaccine  You may need this if you have certain conditions or if you travel or work in places where you may be exposed to hepatitis B. Haemophilus influenzae type b (Hib) vaccine  You may need this if you have certain risk factors. Human papillomavirus (HPV) vaccine  If recommended by your health care provider, you may need three doses over 6 months. You may receive vaccines as individual doses or as more than one vaccine together in one shot (combination vaccines). Talk with your health care provider about the risks and benefits of combination vaccines. What tests do I need? Blood tests  Lipid and cholesterol levels. These may be checked every 5 years, or more frequently if you are over 26 years old.  Hepatitis C test.  Hepatitis B test. Screening  Lung cancer screening. You may have this screening every year starting at age 47 if you have a 30-pack-year history of smoking and currently smoke or have  quit within the past 15 years.  Prostate cancer screening. Recommendations will vary depending on your family history and other risks.  Colorectal cancer screening. All adults should have this screening starting at age 36 and continuing until age 34. Your health care provider may recommend screening at age 68 if you are at increased risk. You will have tests every  1-10 years, depending on your results and the type of screening test.  Diabetes screening. This is done by checking your blood sugar (glucose) after you have not eaten for a while (fasting). You may have this done every 1-3 years.  Sexually transmitted disease (STD) testing. Follow these instructions at home: Eating and drinking  Eat a diet that includes fresh fruits and vegetables, whole grains, lean protein, and low-fat dairy products.  Take vitamin and mineral supplements as recommended by your health care provider.  Do not drink alcohol if your health care provider tells you not to drink.  If you drink alcohol: ? Limit how much you have to 0-2 drinks a day. ? Be aware of how much alcohol is in your drink. In the [place] one drink equals one 12 oz bottle of beer (355 mL), one 5 oz glass of wine (148 mL), or one 1 oz glass of hard liquor (44 mL). Lifestyle  Take daily care of your teeth and gums.  Stay active. Exercise for at least 30 minutes on 5 or more days each week.  Do not use any products that contain nicotine or tobacco, such as cigarettes, e-cigarettes, and chewing tobacco. If you need help quitting, ask your health care provider.  If you are sexually active, practice safe sex. Use a condom or other form of protection to prevent STIs (sexually transmitted infections).  Talk with your health care provider about taking a low-dose aspirin every day starting at age 78. What's next?  Go to your health care provider once a year for a well check visit.  Ask your health care provider how often you should have your eyes and teeth checked.  Stay up to date on all vaccines. This information is not intended to replace advice given to you by your health care provider. Make sure you discuss any questions you have with your health care provider. Document Revised: 11/26/2018 Document Reviewed: 11/26/2018 Elsevier Patient Education  2020 [employer].

## 2020-01-20 NOTE — Assessment & Plan Note (Signed)
Chronic, unchanged. He's undergone numerous cardiac testing including two catheterizations without cause for symptoms. Continue to monitor.

## 2020-01-20 NOTE — Assessment & Plan Note (Signed)
Compliant to simvastatin, lipid panel pending.

## 2020-01-20 NOTE — Assessment & Plan Note (Signed)
Doing well on PRN diclofenac and tizanidine.  Continue same.

## 2020-01-20 NOTE — Progress Notes (Signed)
Subjective:    Patient ID: Martin Deleon, male    DOB: 01/19/1956, 64 y.o.   MRN: [id number]  HPI  This visit occurred during the SARS-CoV-2 public health emergency.  Safety protocols were in place, including screening questions prior to the visit, additional usage of staff PPE, and extensive cleaning of exam room while observing appropriate contact time as indicated for disinfecting solutions.   Martin Deleon is a 64 year old male who presents today for complete physical and follow up of chronic conditions.  Immunizations: -Tetanus: Completed in 2020 -Influenza: Due, will be getting Covid-19 vaccine -Shingles: Never completed -Pneumonia: Completed several years ago.   Diet: He endorses a healthy diet. Exercise: He is active daily at work.  Eye exam: Scheduled for February 2021 Dental exam: He will schedule.  Colonoscopy: Completed in 2019, due in 2029 PSA: 2.12 in 2018 Hep C Screen: Negative  BP Readings from Last 3 Encounters:  01/20/20 124/84  06/25/19 118/70  06/11/19 131/84     Review of Systems  Constitutional: Negative for unexpected weight change.  HENT: Negative for rhinorrhea.   Respiratory: Negative for cough and shortness of breath.   Cardiovascular: Negative for palpitations.  Gastrointestinal: Negative for constipation and diarrhea.  Genitourinary: Negative for difficulty urinating.  Musculoskeletal: Negative for arthralgias and myalgias.  Skin: Negative for rash.  Allergic/Immunologic: Negative for environmental allergies.  Neurological: Negative for numbness and headaches.  Psychiatric/Behavioral:       Doing well on sertraline       Past Medical History:  Diagnosis Date  . Anginal pain ([place])   . Anxiety   . Arthritis   . Diabetes mellitus without complication ([place])   . Headache   . Hypercholesteremia   . Hypertension   . Insulin pump in place   . Neuropathy due to secondary diabetes mellitus ([place])   . PONV (postoperative nausea and  vomiting)      Social History   Socioeconomic History  . Marital status: Married    Spouse name: Not on file  . Number of children: Not on file  . Years of education: Not on file  . Highest education level: Not on file  Occupational History  . Not on file  Tobacco Use  . Smoking status: Former Smoker    Packs/day: 1.00    Types: Cigarettes    Quit date: 03/16/1997    Years since quitting: 22.8  . Smokeless tobacco: Never Used  Substance and Sexual Activity  . Alcohol use: No  . Drug use: Yes    Types: Marijuana  . Sexual activity: Not on file  Other Topics Concern  . Not on file  Social History Narrative   Married.   2 children.   Works as a Nature conservation officer.   Enjoys golfing.    Social Determinants of Health   Financial Resource Strain:   . Difficulty of Paying Living Expenses: Not on file  Food Insecurity:   . Worried About Charity fundraiser in the Last Year: Not on file  . Ran Out of Food in the Last Year: Not on file  Transportation Needs:   . Lack of Transportation (Medical): Not on file  . Lack of Transportation (Non-Medical): Not on file  Physical Activity:   . Days of Exercise per Week: Not on file  . Minutes of Exercise per Session: Not on file  Stress:   . Feeling of Stress : Not on file  Social Connections:   . Frequency of Communication  with Friends and Family: Not on file  . Frequency of Social Gatherings with Friends and Family: Not on file  . Attends Religious Services: Not on file  . Active Member of Clubs or Organizations: Not on file  . Attends Archivist Meetings: Not on file  . Marital Status: Not on file  Intimate Partner Violence:   . Fear of Current or Ex-Partner: Not on file  . Emotionally Abused: Not on file  . Physically Abused: Not on file  . Sexually Abused: Not on file    Past Surgical History:  Procedure Laterality Date  . APPENDECTOMY    . CARDIAC CATHETERIZATION    . CERVICAL FUSION  2005, 2006   [facility] Dr.  Glenna Fellows  . COLONOSCOPY WITH PROPOFOL N/A 05/12/2018   Procedure: COLONOSCOPY WITH PROPOFOL;  Surgeon: Lucilla Lame, MD;  Location: [facility] ENDOSCOPY;  Service: Endoscopy;  Laterality: N/A;  . FRACTURE SURGERY Right August 05, 1986   Elbow  . 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 Heart disease Father   . Heart attack Father 85  . Penile cancer Father   . Arrhythmia Brother     Allergies  Allergen Reactions  . Codeine Nausea And Vomiting    Current Outpatient Medications on File Prior to Visit  Medication Sig Dispense Refill  . aspirin EC 81 MG tablet Take 81 mg by mouth daily.    . diclofenac (VOLTAREN) 75 MG EC tablet TAKE 1 TABLET BY MOUTH TWICE DAILY AS NEEDED FOR MODERATE PAIN 30 tablet 0  . gabapentin (NEURONTIN) 800 MG tablet TAKE 1 TABLET BY MOUTH THREE TIMES DAILY FOR  NEUROPATHIC  PAIN 90 tablet 0  . Insulin Human (INSULIN PUMP) SOLN Inject into the skin every hour. Humalog Insulin Pump--patient stated he has five different rates depending on the time of day    . lisinopril (PRINIVIL,ZESTRIL) 30 MG tablet Take 1 tablet (30 mg total) by mouth daily. For blood pressure. 90 tablet 3  . promethazine (PHENERGAN) 12.5 MG tablet TAKE 1 TO 2 TABLETS BY MOUTH EVERY 8 HOURS AS NEEDED FOR NAUSEA AND VOMITING 20 tablet 0  . rizatriptan (MAXALT) 10 MG tablet Take 10 mg by mouth as needed for migraine. May repeat in 2 hours if needed    . sertraline (ZOLOFT) 100 MG tablet Take 1 tablet by mouth once daily 90 tablet 1  . simvastatin (ZOCOR) 40 MG tablet Take 40 mg by mouth daily at 6 PM.     . tiZANidine (ZANAFLEX) 4 MG tablet TAKE 1 TABLET BY MOUTH THREE TIMES DAILY AS NEEDED FOR MUSCLE SPASMS 270 tablet 0   No current facility-administered medications on file prior to visit.    BP 124/84   Pulse 82    Temp (!) 96.6 F (35.9 C) (Temporal)   Ht 5\' 10"  (1.778 m)   Wt 162 lb 4 oz (73.6 kg)   SpO2 97%   BMI 23.28 kg/m    Objective:   Physical Exam  Constitutional: He is oriented to person, place, and time. He appears well-nourished.  HENT:  Right Ear: Tympanic membrane and ear canal normal.  Left Ear: Tympanic membrane and ear canal normal.  Mouth/Throat: Oropharynx is clear and moist.  Eyes: Pupils are equal, round, and reactive to light. EOM are normal.  Cardiovascular: Normal  rate and regular rhythm.  Respiratory: Effort normal and breath sounds normal.  GI: Soft. Bowel sounds are normal. There is no abdominal tenderness.  Musculoskeletal:        General: Normal range of motion.     Cervical back: Neck supple.  Neurological: He is alert and oriented to person, place, and time. No cranial nerve deficit.  Reflex Scores:      Patellar reflexes are 2+ on the right side and 2+ on the left side. Skin: Skin is warm and dry.  Psychiatric: He has a normal mood and affect.           Assessment & Plan:

## 2020-01-20 NOTE — Assessment & Plan Note (Signed)
Doing well on sertraline 100 mg. Denies SI/HI. Continue same.

## 2020-01-20 NOTE — Assessment & Plan Note (Signed)
Stable in the office today, continue lisinopril. CMP pending.

## 2020-01-20 NOTE — Assessment & Plan Note (Signed)
Following with endocrinology, continue current regimen. 

## 2020-01-20 NOTE — Assessment & Plan Note (Signed)
Tetanus UTD. Will hold off on influenza, pneumonia, and Shingrix for now as he will be starting the Covid-19 series. PSA due, pending. Colonoscopy UTD, due in 2029. Encouraged a healthy diet, regular exercise. Exam today stable. Labs pending.

## 2020-01-20 NOTE — Assessment & Plan Note (Addendum)
Six migraines monthly for which he assumes is secondary to poor vision. He has an eye appointment scheduled for later this month.   Using Promethazine PRN for nausea during migraines.  Using Maxalt 2-3 times monthly.   Discussed to notify me after his eye exam to see if his vision is actually contributing to migraine frequency. We may need to add on daily preventative treatment. He will update.

## 2020-01-20 NOTE — Assessment & Plan Note (Signed)
Doing well on diclofenac and tizanidine for which he takes PRN. Continue same.

## 2020-02-14 DIAGNOSIS — I10 Essential (primary) hypertension: Secondary | ICD-10-CM | POA: Diagnosis not present

## 2020-02-14 DIAGNOSIS — E1065 Type 1 diabetes mellitus with hyperglycemia: Secondary | ICD-10-CM | POA: Diagnosis not present

## 2020-02-14 DIAGNOSIS — E7800 Pure hypercholesterolemia, unspecified: Secondary | ICD-10-CM | POA: Diagnosis not present

## 2020-02-24 ENCOUNTER — Ambulatory Visit: Payer: BC Managed Care – PPO

## 2020-03-15 ENCOUNTER — Other Ambulatory Visit: Payer: Self-pay | Admitting: Primary Care

## 2020-03-15 DIAGNOSIS — G629 Polyneuropathy, unspecified: Secondary | ICD-10-CM

## 2020-03-23 ENCOUNTER — Other Ambulatory Visit: Payer: Self-pay

## 2020-03-23 ENCOUNTER — Ambulatory Visit: Payer: BC Managed Care – PPO

## 2020-03-28 DIAGNOSIS — Z9641 Presence of insulin pump (external) (internal): Secondary | ICD-10-CM | POA: Diagnosis not present

## 2020-03-28 DIAGNOSIS — E109 Type 1 diabetes mellitus without complications: Secondary | ICD-10-CM | POA: Diagnosis not present

## 2020-03-29 ENCOUNTER — Emergency Department: Payer: BC Managed Care – PPO

## 2020-03-29 ENCOUNTER — Encounter: Payer: Self-pay | Admitting: Emergency Medicine

## 2020-03-29 ENCOUNTER — Emergency Department
Admission: EM | Admit: 2020-03-29 | Discharge: 2020-03-29 | Disposition: A | Discharge status: Home / Self Care | Payer: BC Managed Care – PPO | Attending: Emergency Medicine | Admitting: Emergency Medicine

## 2020-03-29 ENCOUNTER — Other Ambulatory Visit: Payer: Self-pay

## 2020-03-29 DIAGNOSIS — M545 Low back pain: Secondary | ICD-10-CM | POA: Diagnosis not present

## 2020-03-29 DIAGNOSIS — Z7982 Long term (current) use of aspirin: Secondary | ICD-10-CM | POA: Diagnosis not present

## 2020-03-29 DIAGNOSIS — I10 Essential (primary) hypertension: Secondary | ICD-10-CM | POA: Insufficient documentation

## 2020-03-29 DIAGNOSIS — Z96642 Presence of left artificial hip joint: Secondary | ICD-10-CM | POA: Insufficient documentation

## 2020-03-29 DIAGNOSIS — Z87891 Personal history of nicotine dependence: Secondary | ICD-10-CM | POA: Diagnosis not present

## 2020-03-29 DIAGNOSIS — Y92002 Bathroom of unspecified non-institutional (private) residence single-family (private) house as the place of occurrence of the external cause: Secondary | ICD-10-CM | POA: Insufficient documentation

## 2020-03-29 DIAGNOSIS — M546 Pain in thoracic spine: Secondary | ICD-10-CM | POA: Diagnosis not present

## 2020-03-29 DIAGNOSIS — Z79899 Other long term (current) drug therapy: Secondary | ICD-10-CM | POA: Insufficient documentation

## 2020-03-29 DIAGNOSIS — G44309 Post-traumatic headache, unspecified, not intractable: Secondary | ICD-10-CM | POA: Diagnosis not present

## 2020-03-29 DIAGNOSIS — R519 Headache, unspecified: Secondary | ICD-10-CM | POA: Insufficient documentation

## 2020-03-29 DIAGNOSIS — M5431 Sciatica, right side: Secondary | ICD-10-CM

## 2020-03-29 DIAGNOSIS — E109 Type 1 diabetes mellitus without complications: Secondary | ICD-10-CM | POA: Insufficient documentation

## 2020-03-29 DIAGNOSIS — M5441 Lumbago with sciatica, right side: Secondary | ICD-10-CM | POA: Diagnosis not present

## 2020-03-29 DIAGNOSIS — Y939 Activity, unspecified: Secondary | ICD-10-CM | POA: Insufficient documentation

## 2020-03-29 DIAGNOSIS — M5126 Other intervertebral disc displacement, lumbar region: Secondary | ICD-10-CM | POA: Insufficient documentation

## 2020-03-29 DIAGNOSIS — X500XXA Overexertion from strenuous movement or load, initial encounter: Secondary | ICD-10-CM | POA: Diagnosis not present

## 2020-03-29 DIAGNOSIS — Y999 Unspecified external cause status: Secondary | ICD-10-CM | POA: Diagnosis not present

## 2020-03-29 LAB — CBC WITH DIFFERENTIAL/PLATELET
Abs Immature Granulocytes: 0.04 10*3/uL (ref 0.00–0.07)
Basophils Absolute: 0.1 10*3/uL (ref 0.0–0.1)
Basophils Relative: 1 %
Eosinophils Absolute: 0.1 10*3/uL (ref 0.0–0.5)
Eosinophils Relative: 1 %
HCT: 42.3 % (ref 39.0–52.0)
Hemoglobin: 14.5 g/dL (ref 13.0–17.0)
Immature Granulocytes: 1 %
Lymphocytes Relative: 30 %
Lymphs Abs: 2.4 10*3/uL (ref 0.7–4.0)
MCH: 31.1 pg (ref 26.0–34.0)
MCHC: 34.3 g/dL (ref 30.0–36.0)
MCV: 90.8 fL (ref 80.0–100.0)
Monocytes Absolute: 0.7 10*3/uL (ref 0.1–1.0)
Monocytes Relative: 8 %
Neutro Abs: 4.9 10*3/uL (ref 1.7–7.7)
Neutrophils Relative %: 59 %
Platelets: 300 10*3/uL (ref 150–400)
RBC: 4.66 MIL/uL (ref 4.22–5.81)
RDW: 12.6 % (ref 11.5–15.5)
WBC: 8.2 10*3/uL (ref 4.0–10.5)
nRBC: 0 % (ref 0.0–0.2)

## 2020-03-29 LAB — COMPREHENSIVE METABOLIC PANEL
ALT: 20 U/L (ref 0–44)
AST: 23 U/L (ref 15–41)
Albumin: 4.3 g/dL (ref 3.5–5.0)
Alkaline Phosphatase: 70 U/L (ref 38–126)
Anion gap: 10 (ref 5–15)
BUN: 10 mg/dL (ref 8–23)
CO2: 24 mmol/L (ref 22–32)
Calcium: 8.9 mg/dL (ref 8.9–10.3)
Chloride: 101 mmol/L (ref 98–111)
Creatinine, Ser: 0.8 mg/dL (ref 0.61–1.24)
GFR calc Af Amer: >60 mL/min (ref >60)
GFR calc non Af Amer: >60 mL/min (ref >60)
Glucose, Bld: 227 mg/dL — ABNORMAL HIGH (ref 70–99)
Potassium: 4.1 mmol/L (ref 3.5–5.1)
Sodium: 135 mmol/L (ref 135–145)
Total Bilirubin: 0.9 mg/dL (ref 0.3–1.2)
Total Protein: 6.9 g/dL (ref 6.5–8.1)

## 2020-03-29 LAB — TROPONIN I (HIGH SENSITIVITY): Troponin I (High Sensitivity): 3 ng/L (ref <18)

## 2020-03-29 LAB — LIPASE, BLOOD: Lipase: 27 U/L (ref 11–51)

## 2020-03-29 MED ORDER — HYDROMORPHONE HCL 1 MG/ML IJ SOLN
1.0000 mg | INTRAMUSCULAR | Status: AC | PRN
  Administered 2020-03-29 (×2): 1 mg via INTRAVENOUS
  Filled 2020-03-29 (×2): qty 1

## 2020-03-29 MED ORDER — HYDROMORPHONE HCL 1 MG/ML IJ SOLN
1.0000 mg | Freq: Once | INTRAMUSCULAR | Status: AC
  Administered 2020-03-29: 1 mg via INTRAVENOUS
  Filled 2020-03-29: qty 1

## 2020-03-29 MED ORDER — LIDOCAINE 5 % EX PTCH
1.0000 | MEDICATED_PATCH | CUTANEOUS | Status: DC
  Administered 2020-03-29: 1 via TRANSDERMAL
  Filled 2020-03-29: qty 1

## 2020-03-29 MED ORDER — METHYLPREDNISOLONE 4 MG PO TBPK: ORAL_TABLET | ORAL | 0 refills | Status: DC

## 2020-03-29 MED ORDER — ONDANSETRON HCL 4 MG/2ML IJ SOLN
INTRAMUSCULAR | Status: AC
  Filled 2020-03-29: qty 2

## 2020-03-29 MED ORDER — OXYCODONE HCL 5 MG PO TABS: 5.0000 mg | ORAL_TABLET | ORAL | 0 refills | Status: AC | PRN

## 2020-03-29 MED ORDER — FENTANYL CITRATE (PF) 100 MCG/2ML IJ SOLN
INTRAMUSCULAR | Status: AC
  Filled 2020-03-29: qty 2

## 2020-03-29 MED ORDER — IBUPROFEN 600 MG PO TABS: 600.0000 mg | ORAL_TABLET | Freq: Four times a day (QID) | ORAL | 0 refills | Status: AC | PRN

## 2020-03-29 MED ORDER — OXYCODONE HCL 5 MG PO TABS
5.0000 mg | ORAL_TABLET | Freq: Once | ORAL | Status: AC
  Administered 2020-03-29: 5 mg via ORAL
  Filled 2020-03-29: qty 1

## 2020-03-29 MED ORDER — ONDANSETRON HCL 4 MG/2ML IJ SOLN
4.0000 mg | Freq: Once | INTRAMUSCULAR | Status: AC
  Administered 2020-03-29: 4 mg via INTRAVENOUS

## 2020-03-29 MED ORDER — FENTANYL CITRATE (PF) 100 MCG/2ML IJ SOLN
50.0000 ug | INTRAMUSCULAR | Status: DC | PRN
  Administered 2020-03-29: 50 ug via INTRAVENOUS

## 2020-03-29 MED ORDER — KETOROLAC TROMETHAMINE 30 MG/ML IJ SOLN
15.0000 mg | Freq: Once | INTRAMUSCULAR | Status: AC
  Administered 2020-03-29: 15 mg via INTRAVENOUS
  Filled 2020-03-29: qty 1

## 2020-03-29 NOTE — Discharge Instructions (Addendum)
Your mri results are as below: I think the pain is coming from the l5/s1.   Take Tylenol 1 g every 8 hours and ibuprofen 600 every 6 hours with food.  Take the oxycodone for breakthrough pain. DO not drive while on this. Take the steroids as well.  Monitor your sugars. Call neurosurgery for follow up.   Return to ER for inability to urinate, accidentally urinating on your self, worsening numbness, worsening weakness any other concerns.    1. 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 No associated foraminal or canal stenosis.  2. Minimal mid to lower thoracic spondylosis without foraminal or  canal stenosis of any level.  3. No acute osseous abnormality.  No cord signal abnormality.     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   Multilevel lumbar spondylosis with mild L2-5 spinal canal narrowing.   Mild to moderate bilateral neural foraminal narrowing at the L2-S1 levels.

## 2020-03-29 NOTE — ED Triage Notes (Addendum)
C/O severe back pain this morning.  STates had done a lot of bending at work on Monday and had some spasms.  Today, while getting up from the commode, felt back 'pop' -- pain shot down right leg and pain has continued.  Patient states he is having right leg weakness since back popped.  States he is unable to bear weight on leg.  Also c/o intermittent numbness to right leg

## 2020-03-29 NOTE — Progress Notes (Signed)
Inpatient Diabetes Program Recommendations  AACE/ADA: New Consensus Statement on Inpatient Glycemic Control   Target Ranges:  Prepandial:   less than 140 mg/dL      Peak postprandial:   less than 180 mg/dL (1-2 hours)      Critically ill patients:  140 - 180 mg/dL   Results for Martin Deleon, Martin Deleon (MRN [id number]) as of 03/29/2020 10:39  Ref. Range 03/29/2020 08:41  Glucose Latest Ref Range: 70 - 99 mg/dL 227 (H)  Results for Martin Deleon, Martin Deleon (MRN [id number]) as of 03/29/2020 10:39  Ref. Range 01/20/2020 10:44  Hemoglobin A1C Latest Ref Range: 4.6 - 6.5 % 8.9 (H)   Review of Glycemic Control  Diabetes history: DM1 (makes NO insulin; requires basal, correction, and carb coverage insulin) Outpatient Diabetes medications: Medtronic 670G insulin pump with Humalog Current orders for Inpatient glycemic control: None; in Emergency Room  Inpatient Diabetes Program Recommendations:   Insulin Pump: If patient is admitted and MD allows to use insulin pump while inpatient, please use Insulin Pump order set to order CBGs ACHS&2am (or Q4H if NPO) and insulin pump ACHS&2am (or Q4H if NPO).  NOTE: In reviewing chart, noted patient in Emergency Room with back pain and glucose on labs today at 8:41 am is 227 mg/dl. Per chart, patient has Type 1 DM and sees [facility] for DM management. Patient last seen Dr. Selinda Flavin on 02/14/20 and noted telemedicine note by C. Izlar, RD on 03/28/20. Per RD note on 03/28/20 the following should be current insulin pump settings:  Basal rates 12 a.m. 0.95 units/hr 6 a.m.  0.75 units/hr 11 a.m. 0.55 units/hr 4 p.m.   0.95 units/hr  Total Basal Insulin = 17.65 units/day  Insulin to carbohydrate ratios 12 a.m.  1 unit: 7 grams 11:30 am  1 unit: 8 grams  5 pm   1 unit: 7 grams  Insulin sensitivity factor - 12 a.m. 1:50 mg/dl (1 unit drops glucose 50 mg/dl) Active insulin duration = 3:30 hours BG targets = 12 a.m. 110-120  NURSING: If patient is admitted and once insulin  pump order set is ordered please print off the Patient insulin pump contract and flow sheet. The insulin pump contract should be signed by the patient and then placed in the chart. The patient insulin pump flow sheet will be completed by the patient at the bedside and the RN caring for the patient will use the patient's flow sheet to document in the [facility]. RN will need to complete the Nursing Insulin Pump Flowsheet at least once a shift. Patient will need to keep extra insulin pump supplies at the bedside at all times.    Thanks, Barnie Alderman, RN, MSN, CDE Diabetes Coordinator Inpatient Diabetes Program [phone] (Team Pager from 8am to 5pm)

## 2020-03-29 NOTE — ED Provider Notes (Signed)
[facility] Emergency Department Provider Note  ____________________________________________   First MD Initiated Contact with Patient 03/29/20 [phone]     (approximate)  I have reviewed the triage vital signs and the nursing notes.   HISTORY  Chief Complaint Back Pain    HPI Martin Deleon is a 64 y.o. male with diabetes, hypertension, hyperlipidemia who comes in for severe back pain.  Patient states around 630 he went to get up off the bathroom toilet when he stood up he felt a pop in his back.  He had immediate numbness and weakness to his right leg.  He states he does not really remember what happened after that.  He is unsure if he lost consciousness or hit his head.  Patient states that he is only having lower back pain at this time, severe, constant, nothing makes it better, worse with moving his left leg.  Patient reports that the sensation is coming back in the leg but still feels different than the left.  He is now having severe pain.  It still feels weaker on the right.  He states that he felt like he was going to lose control of his bladder.          Past Medical History:  Diagnosis Date  . Anginal pain ([place])   . Anxiety   . Arthritis   . Diabetes mellitus without complication ([place])   . Headache   . Hypercholesteremia   . Hypertension   . Insulin pump in place   . Neuropathy due to secondary diabetes mellitus ([place])   . PONV (postoperative nausea and vomiting)     Patient Active Problem List   Diagnosis Date Noted  . Preventative health care 01/08/2019  . Nausea 09/03/2018  . Encounter for screening colonoscopy   . Chronic neck pain 04/01/2018  . Anxiety and depression 04/01/2018  . 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 Weak urinary stream 12/26/2016  . Hyperlipidemia 12/26/2016  . Essential hypertension 12/26/2016  . Migraines 12/26/2016  . Chest pain 12/26/2016  . Type 1 diabetes ([place]) 12/26/2016     Past Surgical History:  Procedure Laterality Date  . APPENDECTOMY    . CARDIAC CATHETERIZATION    . CERVICAL FUSION  2005, 2006   [facility] Dr. Glenna Fellows  . COLONOSCOPY WITH PROPOFOL N/A 05/12/2018   Procedure: COLONOSCOPY WITH PROPOFOL;  Surgeon: Lucilla Lame, MD;  Location: [facility] ENDOSCOPY;  Service: Endoscopy;  Laterality: N/A;  . FRACTURE SURGERY Right August 05, 1986   [place]  . 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 Authorizing Provider  aspirin EC 81 MG tablet Take 81 mg by mouth daily.    [provider]  diclofenac (VOLTAREN) 75 MG EC tablet TAKE 1 TABLET BY MOUTH TWICE DAILY AS NEEDED FOR MODERATE PAIN 12/22/19   Pleas Koch, NP  gabapentin (NEURONTIN) 800 MG tablet TAKE 1 TABLET BY MOUTH THREE TIMES DAILY FOR  NEUROPATHIC  PAIN 03/15/20   Pleas Koch, NP  Insulin Human (INSULIN PUMP) SOLN Inject into the skin every hour. Humalog Insulin Pump--patient stated he has five different rates depending on the time of day    [provider]  lisinopril (PRINIVIL,ZESTRIL) 30 MG tablet Take 1 tablet (30 mg total) by mouth daily. For blood  pressure. 03/11/19   Pleas Koch, NP  promethazine (PHENERGAN) 12.5 MG tablet TAKE 1 TO 2 TABLETS BY MOUTH EVERY 8 HOURS AS NEEDED FOR NAUSEA AND VOMITING 12/28/19   Pleas Koch, NP  rizatriptan (MAXALT) 10 MG tablet Take 10 mg by mouth as needed for migraine. May repeat in 2 hours if needed    [provider]  sertraline (ZOLOFT) 100 MG tablet Take 1 tablet by mouth once daily 11/08/19   Pleas Koch, NP  simvastatin (ZOCOR) 40 MG tablet Take 40 mg by mouth daily at 6 PM.     [provider]  tiZANidine (ZANAFLEX) 4 MG tablet TAKE 1 TABLET BY MOUTH THREE TIMES DAILY AS NEEDED FOR  MUSCLE SPASMS 02/23/19   Pleas Koch, NP    Allergies Codeine  Family History  Problem Relation Age of Onset  . Heart disease Father   . Heart attack Father 56  . Penile cancer Father   . Arrhythmia Brother     Social History Social History   Tobacco Use  . Smoking status: Former Smoker    Packs/day: 1.00    Types: Cigarettes    Quit date: 03/16/1997    Years since quitting: 23.0  . Smokeless tobacco: Never Used  Substance Use Topics  . Alcohol use: No  . Drug use: Yes    Types: Marijuana      Review of Systems Constitutional: No fever/chills Eyes: No visual changes. ENT: No sore throat. Cardiovascular: Denies chest pain. Respiratory: Denies shortness of breath. Gastrointestinal: No abdominal pain.  No nausea, no vomiting.  No diarrhea.  No constipation. Genitourinary: Negative for dysuria. Musculoskeletal: Positive back pain right leg numbness and weakness. Skin: Negative for rash. Neurological: Negative for headaches, right leg numbness and weakness All other ROS negative ____________________________________________   PHYSICAL EXAM:  VITAL SIGNS: ED Triage Vitals  Enc Vitals Group     BP 03/29/20 0831 (!) 133/92     Pulse Rate 03/29/20 0831 91     Resp 03/29/20 0831 (!) 22     Temp 03/29/20 0831 98.4 F (36.9 C)     Temp Source 03/29/20 0831 Oral     SpO2 03/29/20 0831 98 %     Weight 03/29/20 0829 162 lb 4.1 oz (73.6 kg)     Height 03/29/20 0829 5\' 10"  (1.778 m)     Head Circumference --      Peak Flow --      Pain Score 03/29/20 0829 10     Pain Loc --      Pain Edu? --      Excl. in [place]? --     Constitutional: Alert and oriented. Well appearing and in no acute distress. Eyes: Conjunctivae are normal. EOMI. Head: Atraumatic. Nose: No congestion/rhinnorhea. Mouth/Throat: Mucous membranes are moist.   Neck: No stridor. Trachea Midline. FROM.  No C-spine tenderness.  Full range of motion of neck. Cardiovascular: Normal rate, regular  rhythm. Grossly normal heart sounds.  Good peripheral circulation. Respiratory: Normal respiratory effort.  No retractions. Lungs CTAB. Gastrointestinal: Soft and nontender. No distention. No abdominal bruits.  Musculoskeletal: No lower extremity tenderness nor edema.  No joint effusions. Neurologic:  Normal speech and language. No gross focal neurologic deficits are appreciated.  Decreased sensation on his right leg and decreased strength. Skin:  Skin is warm, dry and intact. No rash noted. Psychiatric: Mood and affect are normal. Speech and behavior are normal. Back: L-spine tenderness. GU: Deferred   ____________________________________________  LABS (all labs ordered are listed, but only abnormal results are displayed)  Labs Reviewed  COMPREHENSIVE METABOLIC PANEL - Abnormal; Notable for the following components:      Result Value   Glucose, Bld 227 (*)    All other components within normal limits  CBC WITH DIFFERENTIAL/PLATELET  LIPASE, BLOOD  URINALYSIS, COMPLETE (UACMP) WITH MICROSCOPIC  TROPONIN I (HIGH SENSITIVITY)  TROPONIN I (HIGH SENSITIVITY)   ____________________________________________   ED ECG REPORT I, Vanessa Noorvik, the attending physician, personally viewed and interpreted this ECG.  EKG is normal sinus rate of 73, no ST elevation, no T wave inversions, normal intervals ____________________________________________  RADIOLOGY   Official radiology report(s): CT Head Wo Contrast  Result Date: 03/29/2020 CLINICAL DATA:  Posttraumatic headache after fall. EXAM: CT HEAD WITHOUT CONTRAST TECHNIQUE: Contiguous axial images were obtained from the base of the skull through the vertex without intravenous contrast. COMPARISON:  February 18, 2012. FINDINGS: Brain: No evidence of acute infarction, hemorrhage, hydrocephalus, extra-axial collection or mass lesion/mass effect. Vascular: No hyperdense vessel or unexpected calcification. Skull: Normal. Negative for fracture or  focal lesion. Sinuses/Orbits: No acute finding. Other: None. IMPRESSION: Normal head CT. Electronically Signed   By: Marijo Conception M.D.   On: 03/29/2020 09:54   MR THORACIC SPINE WO CONTRAST  Result Date: 03/29/2020 CLINICAL DATA:  Acute mid back pain EXAM: MRI THORACIC SPINE WITHOUT CONTRAST TECHNIQUE: Multiplanar, multisequence MR imaging of the thoracic spine was performed. No intravenous contrast was administered. COMPARISON:  CT 08/23/2014 FINDINGS: Alignment:  Physiologic. Vertebrae: No fracture, evidence of discitis, or bone lesion. Cord:  Normal signal and morphology. Paraspinal and other soft tissues: 1.0 cm T2 hyperintense focus within the posterior right hepatic lobe (series 20, image 34), incompletely characterized 3 May reflect a cyst. Disc levels: T1-T2: No significant canal or neural foraminal narrowing. T2-T3: No significant canal or neural foraminal narrowing. T3-T4: No significant canal or neural foraminal narrowing. T4-T5: No significant canal or neural foraminal narrowing. T5-T6: No significant canal or neural foraminal narrowing. T6-T7: No significant canal or neural foraminal narrowing. T7-T8: No significant canal or neural foraminal narrowing. T8-T9: Minimal disc bulge without foraminal or canal stenosis. T9-T10: Minimal disc bulge, slightly eccentric to the left without foraminal or canal stenosis. T10-T11: No significant canal or neural foraminal narrowing. T11-T12: Right paracentral disc extrusion with cranial migration of T1 slightly hyperintense, T2 heterogeneously hyperintense disc material 2.1 cm extending just below the level of the T11 superior endplate (series 18, image 6; series 17, image 6-7). No associated canal or foraminal stenosis. T12-L1: No significant canal or neural foraminal narrowing. IMPRESSION: 1. Large right paracentral disc extrusion at T11-12 with cranial migration of disc material to just below the level of the T11 superior endplate. No associated foraminal or  canal stenosis. 2. Minimal mid to lower thoracic spondylosis without foraminal or canal stenosis of any level. 3. No acute osseous abnormality.  No cord signal abnormality. Electronically Signed   By: Davina Poke D.O.   On: 03/29/2020 12:54   MR LUMBAR SPINE WO CONTRAST  Result Date: 03/29/2020 CLINICAL DATA:  Low back pain, progressive neurologic deficit. EXAM: MRI LUMBAR SPINE WITHOUT CONTRAST TECHNIQUE: Multiplanar, multisequence MR imaging of the lumbar spine was performed. No intravenous contrast was administered. COMPARISON:  Concurrent MRI thoracic spine. FINDINGS: Segmentation:  Standard. Alignment:  Normal. Vertebrae: Normal bone marrow signal intensity. No focal osseous lesions. Conus medullaris and cauda equina: Conus extends to the L1-2 level. Conus and cauda equina appear normal. Disc  levels: L1-2: No significant disc bulge, spinal canal or neural foraminal narrowing. L2-3: Broad-based disc bulge abutting ventral thecal sac with mild ligamentum flavum thickening. Mild spinal canal and mild to moderate bilateral neural foraminal narrowing. L3-4: Broad-based disc bulge abutting the ventral thecal sac and descending right L4 nerve roots (9:23) with mild ligamentum flavum thickening and facet hypertrophy. Mild spinal canal and mild to moderate bilateral neural foraminal narrowing. L4-5: Broad-based disc bulge abutting the ventral thecal sac and descending left L5 nerve roots, mild ligamentum flavum and bilateral facet hypertrophy. Minimal to mild spinal canal narrowing. Moderate bilateral neural foraminal narrowing. L5-S1: Broad-based disc bulge with left paracentral nucleus pulposis herniation and annular fissuring (9:35, 7:9) grazing the descending left S1 nerve root. Mild ligamentum flavum and bilateral facet hypertrophy. Mild bilateral neural foraminal narrowing. Minimal spinal canal narrowing. Paraspinal and other soft tissues: 1.1 cm right hepatic cyst (9:1). Paraspinal soft tissues are  unremarkable. 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 Multilevel lumbar spondylosis with mild L2-5 spinal canal narrowing. Mild to moderate bilateral neural foraminal narrowing at the L2-S1 levels. Electronically Signed   By: Primitivo Gauze M.D.   On: 03/29/2020 13:05    ____________________________________________   PROCEDURES  Procedure(s) performed (including Critical Care):  Procedures   ____________________________________________   INITIAL IMPRESSION / ASSESSMENT AND PLAN / ED COURSE  Martin Deleon was evaluated in Emergency Department on 03/29/2020 for the symptoms described in the history of present illness. He was evaluated in the context of the global COVID-19 pandemic, which necessitated consideration that the patient might be at risk for infection with the SARS-CoV-2 virus that causes COVID-19. Institutional protocols and algorithms that pertain to the evaluation of patients at risk for COVID-19 are in a state of rapid change based on information released by regulatory bodies including the CDC and federal and state organizations. These policies and algorithms were followed during the patient's care in the ED.    Patient is a 64 year old who comes in with sudden onset severe back pain in the setting of standing up and feels a pop in his lower back.  Patient unsure if he had LOC secondary to the pain..  Will get basic labs to evaluate for electrolyte abnormalities, AKI, CT head given unsure if he hit his head.  Patient will need MRI of his back to make sure no evidence of cord compression given his right leg weakness and numbness  Labs are reassuring.  EKG was negative.   MRI shows large right paracentral disc extrusion at T11-T12 As well as large paracentral L5-S1 nucleus pulposus herniation with annular fissuring raising the descending left S1 nerve root.  On reevaluation patient's  pain is much better.  Patient still says that the sensation feels different on his right leg versus his left leg but is improving.  He is able to lift up the upper thigh but unable to lift the heel up off the bed secondary to pain.  Given there is no evidence of cord compression we discussed symptomatic management and follow-up with neurosurgery outpatient  Discussed with Dr. Izora Ribas and recommends medrol dose pack and will follow up in office.    Discussed with patient he is on insulin pump and states that he can adjust his insulin to accommodate with the steroids.  He understands he needs to keep a close eye on his sugars.   We will discharge patient on Tylenol, ibuprofen, oxycodone for breakthrough pain and he  understands not to drive on this and the Solu-Medrol Dosepak.  Lidocaine patch also provided  I discussed the provisional nature of ED diagnosis, the treatment so far, the ongoing plan of care, follow up appointments and return precautions with the patient and any family or support people present. They expressed understanding and agreed with the plan, discharged home.         ____________________________________________   FINAL CLINICAL IMPRESSION(S) / ED DIAGNOSES   Final diagnoses:  Lumbar herniated disc  Sciatica of right side      MEDICATIONS GIVEN DURING THIS VISIT:  Medications  lidocaine (LIDODERM) 5 % 1 patch (1 patch Transdermal Patch Applied 03/29/20 1105)  ondansetron (ZOFRAN) injection 4 mg (4 mg Intravenous Given 03/29/20 0839)  HYDROmorphone (DILAUDID) injection 1 mg (1 mg Intravenous Given 03/29/20 0951)  HYDROmorphone (DILAUDID) injection 1 mg (1 mg Intravenous Given 03/29/20 1308)  oxyCODONE (Oxy IR/ROXICODONE) immediate release tablet 5 mg (5 mg Oral Given 03/29/20 1503)  ketorolac (TORADOL) 30 MG/ML injection 15 mg (15 mg Intravenous Given 03/29/20 1503)     ED Discharge Orders         Ordered    oxyCODONE (ROXICODONE) 5 MG immediate release  tablet  Every 4 hours PRN     03/29/20 1431    methylPREDNISolone (MEDROL DOSEPAK) 4 MG TBPK tablet     03/29/20 1456           Note:  This document was prepared using Dragon voice recognition software and may include unintentional dictation errors.   Vanessa Hernando, MD 03/29/20 [phone]

## 2020-03-30 ENCOUNTER — Ambulatory Visit: Payer: BC Managed Care – PPO

## 2020-04-03 DIAGNOSIS — E109 Type 1 diabetes mellitus without complications: Secondary | ICD-10-CM | POA: Diagnosis not present

## 2020-04-03 DIAGNOSIS — Z4681 Encounter for fitting and adjustment of insulin pump: Secondary | ICD-10-CM | POA: Diagnosis not present

## 2020-04-27 DIAGNOSIS — E108 Type 1 diabetes mellitus with unspecified complications: Secondary | ICD-10-CM | POA: Diagnosis not present

## 2020-04-27 DIAGNOSIS — Z794 Long term (current) use of insulin: Secondary | ICD-10-CM | POA: Diagnosis not present

## 2020-05-14 ENCOUNTER — Other Ambulatory Visit: Payer: Self-pay | Admitting: Primary Care

## 2020-05-14 DIAGNOSIS — F32A Depression, unspecified: Secondary | ICD-10-CM

## 2020-05-16 ENCOUNTER — Other Ambulatory Visit: Payer: Self-pay | Admitting: Primary Care

## 2020-05-16 DIAGNOSIS — G629 Polyneuropathy, unspecified: Secondary | ICD-10-CM

## 2020-05-16 DIAGNOSIS — M542 Cervicalgia: Secondary | ICD-10-CM

## 2020-05-16 DIAGNOSIS — R110 Nausea: Secondary | ICD-10-CM

## 2020-05-16 NOTE — Telephone Encounter (Signed)
Last prescribed on 02/23/2019. Last OV on 04/08/2020 (cpe). No future OV scheduled

## 2020-05-22 DIAGNOSIS — E1065 Type 1 diabetes mellitus with hyperglycemia: Secondary | ICD-10-CM | POA: Diagnosis not present

## 2020-05-22 DIAGNOSIS — I10 Essential (primary) hypertension: Secondary | ICD-10-CM | POA: Diagnosis not present

## 2020-05-22 DIAGNOSIS — E7800 Pure hypercholesterolemia, unspecified: Secondary | ICD-10-CM | POA: Diagnosis not present

## 2020-05-29 DIAGNOSIS — M5412 Radiculopathy, cervical region: Secondary | ICD-10-CM | POA: Diagnosis not present

## 2020-05-29 DIAGNOSIS — M542 Cervicalgia: Secondary | ICD-10-CM | POA: Diagnosis not present

## 2020-05-29 DIAGNOSIS — M545 Low back pain: Secondary | ICD-10-CM | POA: Diagnosis not present

## 2020-05-29 DIAGNOSIS — G8929 Other chronic pain: Secondary | ICD-10-CM | POA: Diagnosis not present

## 2020-07-10 DIAGNOSIS — D229 Melanocytic nevi, unspecified: Secondary | ICD-10-CM

## 2020-07-17 DIAGNOSIS — Z794 Long term (current) use of insulin: Secondary | ICD-10-CM | POA: Diagnosis not present

## 2020-07-17 DIAGNOSIS — E108 Type 1 diabetes mellitus with unspecified complications: Secondary | ICD-10-CM | POA: Diagnosis not present

## 2020-07-26 DIAGNOSIS — M4802 Spinal stenosis, cervical region: Secondary | ICD-10-CM | POA: Diagnosis not present

## 2020-07-26 DIAGNOSIS — M542 Cervicalgia: Secondary | ICD-10-CM | POA: Diagnosis not present

## 2020-07-26 DIAGNOSIS — M5021 Other cervical disc displacement,  high cervical region: Secondary | ICD-10-CM | POA: Diagnosis not present

## 2020-07-26 DIAGNOSIS — M5412 Radiculopathy, cervical region: Secondary | ICD-10-CM | POA: Diagnosis not present

## 2020-07-27 DIAGNOSIS — Z1152 Encounter for screening for COVID-19: Secondary | ICD-10-CM | POA: Diagnosis not present

## 2020-07-27 DIAGNOSIS — Z03818 Encounter for observation for suspected exposure to other biological agents ruled out: Secondary | ICD-10-CM | POA: Diagnosis not present

## 2020-08-23 DIAGNOSIS — E785 Hyperlipidemia, unspecified: Secondary | ICD-10-CM

## 2020-08-23 NOTE — Telephone Encounter (Signed)
Patient scheduled with Dr. Lorelei Pont for 08/24/20.

## 2020-08-23 NOTE — Telephone Encounter (Signed)
[place] Night - Client Nonclinical Telephone Record AccessNurse Client [facility] Night - Client Client Site [place] Physician Alma Friendly - NP Contact Type Call Who Is Calling Patient / Member / Family / Caregiver Caller Name Prien Phone Number [phone] Patient Name Martin Deleon Patient DOB Nov 01, 1956 Call Type Message Only Information Provided Reason for Call Request to Schedule Office Appointment Initial Comment Caller states he has been having difficulty with his foot on his left side. Additional Comment Caller declined triage. Provided office hours. Disp. Time Disposition Final User 08/23/2020 8:12:27 AM General Information Provided Yes Dory Larsen Call Closed By: Dory Larsen Transaction Date/Time: 08/23/2020 8:10:41 AM (ET)

## 2020-08-23 NOTE — Telephone Encounter (Signed)
See pt notes in this pt message.

## 2020-08-24 ENCOUNTER — Ambulatory Visit (INDEPENDENT_AMBULATORY_CARE_PROVIDER_SITE_OTHER)
Admission: RE | Admit: 2020-08-24 | Discharge: 2020-08-24 | Disposition: A | Discharge status: Home / Self Care | Payer: BC Managed Care – PPO | Source: Ambulatory Visit | Attending: Family Medicine | Admitting: Family Medicine

## 2020-08-24 ENCOUNTER — Encounter: Payer: Self-pay | Admitting: Family Medicine

## 2020-08-24 ENCOUNTER — Ambulatory Visit (INDEPENDENT_AMBULATORY_CARE_PROVIDER_SITE_OTHER): Payer: BC Managed Care – PPO | Admitting: Family Medicine

## 2020-08-24 ENCOUNTER — Other Ambulatory Visit: Payer: Self-pay

## 2020-08-24 VITALS — BP 132/78 | HR 58 | Temp 98.0°F | Ht 70.5 in | Wt 163.5 lb

## 2020-08-24 DIAGNOSIS — M7732 Calcaneal spur, left foot: Secondary | ICD-10-CM | POA: Diagnosis not present

## 2020-08-24 DIAGNOSIS — M79672 Pain in left foot: Secondary | ICD-10-CM

## 2020-08-24 DIAGNOSIS — M7672 Peroneal tendinitis, left leg: Secondary | ICD-10-CM

## 2020-08-24 DIAGNOSIS — M76822 Posterior tibial tendinitis, left leg: Secondary | ICD-10-CM | POA: Diagnosis not present

## 2020-08-24 DIAGNOSIS — I739 Peripheral vascular disease, unspecified: Secondary | ICD-10-CM | POA: Diagnosis not present

## 2020-08-24 DIAGNOSIS — Z23 Encounter for immunization: Secondary | ICD-10-CM | POA: Diagnosis not present

## 2020-08-24 MED ORDER — SIMVASTATIN 40 MG PO TABS: 40.0000 mg | ORAL_TABLET | Freq: Every evening | ORAL | 1 refills | Status: DC

## 2020-08-24 NOTE — Progress Notes (Signed)
Consider    Annaliyah Willig T. Cena Bruhn, MD, [place]  Primary Care and [place] at [facility] [place], [address]  Phone: [phone]  FAX: [place] - 64 y.o. male  MRN [id number]  Date of Birth: May 15, 1956  Date: 08/24/2020  PCP: Pleas Koch, NP  Referral: Pleas Koch, NP  Chief Complaint  Patient presents with  . Foot Pain    left x88month no known injury.  Pt states pain is in heel and arch    This visit occurred during the SARS-CoV-2 public health emergency.  Safety protocols were in place, including screening questions prior to the visit, additional usage of staff PPE, and extensive cleaning of exam room while observing appropriate contact time as indicated for disinfecting solutions.   Subjective:   Martin [place] is a 64 y.o. very pleasant male patient with Body mass index is 23.13 kg/m. who presents with the following:  He is a pleasant gentleman and he presents with some left foot pain, and he is here to see me for further evaluation.  He has multiple points of pain in his foot.  Predominant pain is in the heel.  This is not only on the plantar aspect but is also on the sides as well.  He also has pain at the medial and lateral aspect of his foot and with movement and pressure.  He also has some pain and some swelling in the midfoot itself.  Also has some other joints bother him now.   Tried a OTC insole.    Calc stress fx Peroneal tend PT tendin Dorsal pain Plantar heel pain  Review of Systems is noted in the HPI, as appropriate   Objective:   BP 132/78   Pulse (!) 58   Temp 98 F (36.7 C) (Temporal)   Ht 5' 10.5" (1.791 m)   Wt 163 lb 8 oz (74.2 kg)   SpO2 97%   BMI 23.13 kg/m    GEN: No acute distress; alert,appropriate. PULM: Breathing comfortably in no respiratory distress PSYCH: Normally interactive.    Left foot and ankle: Nontender throughout  the tibia and fibula.  Nontender at the talus.  He does have notable pain with calcaneus squeeze.  Nontender at Achilles tendon.  Both the peroneal as well as the posterior tibialis tendons are tender to palpation.  He also has some pain and modest swelling of the midfoot itself.  All metatarsal shafts are nontender.  All phalanges and MTP joints are nontender.  Radiology: No results found.  Assessment and Plan:     ICD-10-CM   1. Pain of left heel  M79.672   2. Acute foot pain, left  M79.672 DG Os Calcis Left  3. Peroneal tendinitis of lower leg, left  M76.72   4. Tibial tendinitis, posterior, left  M76.822    Total encounter time: 30 minutes. This includes total time spent on the day of encounter.  Multipart foot problem.  Primary drivers pain at the heel with pain with calcaneal squeeze alone with clinically a calcaneal stress reaction.  I do not see a true fracture on his plain film.  He also has multiple secondary issues at the foot including peroneal tendinopathy, posterior tibialis tendinopathy as well as some pain in the midfoot.  Immobilized in a fracture boot and follow-up with me in 6 weeks.  Hopefully this will resolve and heal at that point in 6 to 8 weeks.  If  symptoms persist an MRI of the heel is most appropriate.  Follow-up: Return in about 6 weeks (around 10/05/2020).  No orders of the defined types were placed in this encounter.  Medications Discontinued During This Encounter  Medication Reason  . tiZANidine (ZANAFLEX) 4 MG tablet Change in therapy   Orders Placed This Encounter  Procedures  . DG Os Calcis Left    Signed,  Sharlene Mccluskey T. Sanjna Haskew, MD   Outpatient Encounter Medications as of 08/24/2020  Medication Sig  . aspirin EC 81 MG tablet Take 81 mg by mouth daily.  Marland Kitchen gabapentin (NEURONTIN) 800 MG tablet TAKE 1 TABLET BY MOUTH THREE TIMES DAILY FOR  NEUROPATHIC  PAIN  . Insulin Human (INSULIN PUMP) SOLN Inject into the skin every hour. Humalog Insulin  Pump--patient stated he has five different rates depending on the time of day  . insulin lispro (HUMALOG) 100 UNIT/ML injection SMARTSIG:0-80 Unit(s) SUB-Q Daily  . lisinopril (ZESTRIL) 20 MG tablet Take 20 mg by mouth daily.  . promethazine (PHENERGAN) 12.5 MG tablet TAKE 1 TO 2 TABLETS BY MOUTH EVERY 8 HOURS AS NEEDED FOR NAUSEA AND VOMITING  . sertraline (ZOLOFT) 100 MG tablet Take 1 tablet by mouth once daily  . simvastatin (ZOCOR) 40 MG tablet Take 40 mg by mouth daily at 6 PM.   . [DISCONTINUED] tiZANidine (ZANAFLEX) 4 MG tablet TAKE 1 TABLET BY MOUTH THREE TIMES DAILY AS NEEDED FOR MUSCLE SPASMS  . methylPREDNISolone (MEDROL DOSEPAK) 4 MG TBPK tablet Take as directed on the box.   No facility-administered encounter medications on file as of 08/24/2020.

## 2020-08-24 NOTE — Addendum Note (Signed)
Addended by: Carter Kitten on: 08/24/2020 09:21 AM   Modules accepted: Orders

## 2020-08-30 DIAGNOSIS — E1065 Type 1 diabetes mellitus with hyperglycemia: Secondary | ICD-10-CM | POA: Diagnosis not present

## 2020-08-30 DIAGNOSIS — E7800 Pure hypercholesterolemia, unspecified: Secondary | ICD-10-CM | POA: Diagnosis not present

## 2020-08-30 DIAGNOSIS — Z6822 Body mass index (BMI) 22.0-22.9, adult: Secondary | ICD-10-CM | POA: Diagnosis not present

## 2020-08-30 DIAGNOSIS — E782 Mixed hyperlipidemia: Secondary | ICD-10-CM | POA: Diagnosis not present

## 2020-08-30 DIAGNOSIS — I10 Essential (primary) hypertension: Secondary | ICD-10-CM | POA: Diagnosis not present

## 2020-08-30 LAB — HEMOGLOBIN A1C: Hemoglobin A1C: 9.2

## 2020-09-11 ENCOUNTER — Telehealth: Payer: Self-pay | Admitting: *Deleted

## 2020-09-11 NOTE — Telephone Encounter (Signed)
Patient called stating that he has had headaches off and on for a while and Allie Bossier NP is aware of. Patient stated that he started appoximately two weeks ago with headaches, weight loss, loose, dark stools, a lot more gas, low energy, cough, chills, body aches. Patient stated that he is also having SOB and  his heart is pounding like it is coming out of his chest. 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 Patient was advised since he is having some SOB and his heart is pounding he needs to have a face to face evaluation. Patient was advised with his symptoms we can not bring him into the office. Patient was given information on the Urgent Care at [facility]. Patient stated that he feels that he needs to be seen and probably needs to be tested for covid. Patient was advised that he needs to quarantine until his test results are back. Patient stated that he is going to the Urgent Care now.

## 2020-09-11 NOTE — Telephone Encounter (Signed)
Agree with advice given

## 2020-09-18 DIAGNOSIS — G5623 Lesion of ulnar nerve, bilateral upper limbs: Secondary | ICD-10-CM | POA: Diagnosis not present

## 2020-09-18 DIAGNOSIS — G5603 Carpal tunnel syndrome, bilateral upper limbs: Secondary | ICD-10-CM | POA: Diagnosis not present

## 2020-09-26 NOTE — Progress Notes (Deleted)
° ° °  Martin [place] T. Shawnell Dykes, MD, [place]  Primary Care and [place] at [facility] [place], [address]  Phone: [phone]   FAX: [place] - 64 y.o. male   MRN [id number]   Date of Birth: 1956-08-25  Date: 09/27/2020   PCP: Pleas Koch, NP   Referral: Pleas Koch, NP  No chief complaint on file.   This visit occurred during the SARS-CoV-2 public health emergency.  Safety protocols were in place, including screening questions prior to the visit, additional usage of staff PPE, and extensive cleaning of exam room while observing appropriate contact time as indicated for disinfecting solutions.   Subjective:   Martin De[place] is a 64 y.o. very pleasant male patient with There is no height or weight on file to calculate BMI. who presents with the following:  I saw the patient 6 weeks ago, and that point he was having some significant pain throughout much of his foot and heel, but predominantly at the calcaneus, and I was concerned that it may he may have a calcaneal stress fracture.  I placed him in a fracture boot and he is here to follow-up today.    08/24/2020 Last OV with Owens Loffler, MD He has multiple points of pain in his foot.  Predominant pain is in the heel.  This is not only on the plantar aspect but is also on the sides as well.  He also has pain at the medial and lateral aspect of his foot and with movement and pressure.  He also has some pain and some swelling in the midfoot itself.  Also has some other joints bother him now.   Tried a OTC insole.    Calc stress fx Peroneal tend PT tendin Dorsal pain Plantar heel pain  Review of Systems is noted in the HPI, as appropriate   Objective:   There were no vitals taken for this visit.  ***  Radiology: No results found.  Assessment and Plan:   ***

## 2020-09-27 ENCOUNTER — Ambulatory Visit: Payer: BC Managed Care – PPO | Admitting: Family Medicine

## 2020-09-27 DIAGNOSIS — M7672 Peroneal tendinitis, left leg: Secondary | ICD-10-CM

## 2020-09-27 DIAGNOSIS — M76822 Posterior tibial tendinitis, left leg: Secondary | ICD-10-CM

## 2020-09-27 DIAGNOSIS — M79672 Pain in left foot: Secondary | ICD-10-CM

## 2020-10-06 DIAGNOSIS — E108 Type 1 diabetes mellitus with unspecified complications: Secondary | ICD-10-CM | POA: Diagnosis not present

## 2020-10-06 DIAGNOSIS — Z794 Long term (current) use of insulin: Secondary | ICD-10-CM | POA: Diagnosis not present

## 2020-10-10 ENCOUNTER — Encounter: Payer: Self-pay | Admitting: Family Medicine

## 2020-10-10 NOTE — Telephone Encounter (Signed)
I think the primary issue is infectious. I will let Mrs. Clark address.  Foot / heel pain is not urgent, particularly if getting better.

## 2020-10-18 DIAGNOSIS — G5622 Lesion of ulnar nerve, left upper limb: Secondary | ICD-10-CM | POA: Diagnosis not present

## 2020-10-18 DIAGNOSIS — M5412 Radiculopathy, cervical region: Secondary | ICD-10-CM | POA: Diagnosis not present

## 2020-10-18 DIAGNOSIS — M25542 Pain in joints of left hand: Secondary | ICD-10-CM | POA: Diagnosis not present

## 2020-10-18 DIAGNOSIS — M5000 Cervical disc disorder with myelopathy, unspecified cervical region: Secondary | ICD-10-CM | POA: Diagnosis not present

## 2020-10-18 DIAGNOSIS — M4802 Spinal stenosis, cervical region: Secondary | ICD-10-CM | POA: Diagnosis not present

## 2020-10-25 NOTE — Telephone Encounter (Signed)
I do not see in the system where he went to urgent care locally. Can we call to find out how he is doing and see if we can fit him in for an appointment this week?

## 2020-10-25 NOTE — Telephone Encounter (Signed)
Pt calling had negative covid test about 22 days ago; pt has several issues; pt is weak; when pt clears throat phlegm is clear; no cough and no head congestion; no fever; pt has had SOB with exertion for 6-7 weeks. Wt loss of 7 lbs in last 14 days. Now wt 158. BM larger amts,more often and soft form but not diarrhea per pt. Pt has lower abd pain on and off below belly button but pt said that is related to gas.  Pt has frequency of urine with urgency, burn upon urination on and off, dark urine but pt is drinking a lot of fluids; FBS today is 120 pt has spoken with endo and does not think related to DM. Pt feels like not getting enough oxygen, pt said he feels weird and off more than ever. Pt almost feels nauseated and dizzy; No CP other than normal angina. Pt has H/A but relates to neck pain and neuro is following that. Pt said he feels shaky inside like when he had seizure 30 yrs ago and feels like could pass out. Pt will go to [facility] on [address] now; pt denied 911 and will have wife take him to UC now. FYI to Gentry Fitz NP.

## 2020-10-26 ENCOUNTER — Emergency Department
Admission: EM | Admit: 2020-10-26 | Discharge: 2020-10-26 | Disposition: A | Discharge status: Home / Self Care | Payer: BC Managed Care – PPO | Attending: Emergency Medicine | Admitting: Emergency Medicine

## 2020-10-26 ENCOUNTER — Emergency Department: Payer: BC Managed Care – PPO

## 2020-10-26 ENCOUNTER — Ambulatory Visit: Payer: BC Managed Care – PPO | Admitting: Primary Care

## 2020-10-26 DIAGNOSIS — R110 Nausea: Secondary | ICD-10-CM | POA: Insufficient documentation

## 2020-10-26 DIAGNOSIS — I10 Essential (primary) hypertension: Secondary | ICD-10-CM | POA: Insufficient documentation

## 2020-10-26 DIAGNOSIS — Z87891 Personal history of nicotine dependence: Secondary | ICD-10-CM | POA: Diagnosis not present

## 2020-10-26 DIAGNOSIS — R197 Diarrhea, unspecified: Secondary | ICD-10-CM | POA: Diagnosis not present

## 2020-10-26 DIAGNOSIS — Z79899 Other long term (current) drug therapy: Secondary | ICD-10-CM | POA: Insufficient documentation

## 2020-10-26 DIAGNOSIS — R55 Syncope and collapse: Secondary | ICD-10-CM | POA: Diagnosis not present

## 2020-10-26 DIAGNOSIS — R1110 Vomiting, unspecified: Secondary | ICD-10-CM | POA: Diagnosis not present

## 2020-10-26 DIAGNOSIS — R531 Weakness: Secondary | ICD-10-CM | POA: Insufficient documentation

## 2020-10-26 DIAGNOSIS — Z7982 Long term (current) use of aspirin: Secondary | ICD-10-CM | POA: Diagnosis not present

## 2020-10-26 DIAGNOSIS — R0789 Other chest pain: Secondary | ICD-10-CM | POA: Diagnosis not present

## 2020-10-26 DIAGNOSIS — M25542 Pain in joints of left hand: Secondary | ICD-10-CM | POA: Insufficient documentation

## 2020-10-26 DIAGNOSIS — R0602 Shortness of breath: Secondary | ICD-10-CM | POA: Diagnosis not present

## 2020-10-26 DIAGNOSIS — R112 Nausea with vomiting, unspecified: Secondary | ICD-10-CM | POA: Diagnosis not present

## 2020-10-26 DIAGNOSIS — Z96642 Presence of left artificial hip joint: Secondary | ICD-10-CM | POA: Diagnosis not present

## 2020-10-26 DIAGNOSIS — Z794 Long term (current) use of insulin: Secondary | ICD-10-CM | POA: Insufficient documentation

## 2020-10-26 DIAGNOSIS — R1030 Lower abdominal pain, unspecified: Secondary | ICD-10-CM | POA: Diagnosis not present

## 2020-10-26 DIAGNOSIS — R195 Other fecal abnormalities: Secondary | ICD-10-CM

## 2020-10-26 DIAGNOSIS — E1040 Type 1 diabetes mellitus with diabetic neuropathy, unspecified: Secondary | ICD-10-CM | POA: Insufficient documentation

## 2020-10-26 DIAGNOSIS — R079 Chest pain, unspecified: Secondary | ICD-10-CM | POA: Diagnosis not present

## 2020-10-26 LAB — URINALYSIS, COMPLETE (UACMP) WITH MICROSCOPIC
Bacteria, UA: NONE SEEN
Bilirubin Urine: NEGATIVE
Glucose, UA: 50 mg/dL — AB
Hgb urine dipstick: NEGATIVE
Ketones, ur: NEGATIVE mg/dL
Leukocytes,Ua: NEGATIVE
Nitrite: NEGATIVE
Protein, ur: NEGATIVE mg/dL
Specific Gravity, Urine: 1.012 (ref 1.005–1.030)
Squamous Epithelial / HPF: NONE SEEN (ref 0–5)
pH: 6 (ref 5.0–8.0)

## 2020-10-26 LAB — CBC
HCT: 38.8 % — ABNORMAL LOW (ref 39.0–52.0)
Hemoglobin: 13.2 g/dL (ref 13.0–17.0)
MCH: 31.6 pg (ref 26.0–34.0)
MCHC: 34 g/dL (ref 30.0–36.0)
MCV: 92.8 fL (ref 80.0–100.0)
Platelets: 240 10*3/uL (ref 150–400)
RBC: 4.18 MIL/uL — ABNORMAL LOW (ref 4.22–5.81)
RDW: 12.3 % (ref 11.5–15.5)
WBC: 8.6 10*3/uL (ref 4.0–10.5)
nRBC: 0 % (ref 0.0–0.2)

## 2020-10-26 LAB — BASIC METABOLIC PANEL
Anion gap: 10 (ref 5–15)
BUN: 12 mg/dL (ref 8–23)
CO2: 22 mmol/L (ref 22–32)
Calcium: 8.4 mg/dL — ABNORMAL LOW (ref 8.9–10.3)
Chloride: 100 mmol/L (ref 98–111)
Creatinine, Ser: 0.65 mg/dL (ref 0.61–1.24)
GFR, Estimated: >60 mL/min (ref >60)
Glucose, Bld: 215 mg/dL — ABNORMAL HIGH (ref 70–99)
Potassium: 4 mmol/L (ref 3.5–5.1)
Sodium: 132 mmol/L — ABNORMAL LOW (ref 135–145)

## 2020-10-26 LAB — HEPATIC FUNCTION PANEL
ALT: 19 U/L (ref 0–44)
AST: 22 U/L (ref 15–41)
Albumin: 4 g/dL (ref 3.5–5.0)
Alkaline Phosphatase: 74 U/L (ref 38–126)
Bilirubin, Direct: <0.1 mg/dL (ref 0.0–0.2)
Total Bilirubin: 0.6 mg/dL (ref 0.3–1.2)
Total Protein: 6.5 g/dL (ref 6.5–8.1)

## 2020-10-26 LAB — TROPONIN I (HIGH SENSITIVITY)
Troponin I (High Sensitivity): 3 ng/L (ref <18)
Troponin I (High Sensitivity): 4 ng/L (ref <18)

## 2020-10-26 LAB — LIPASE, BLOOD: Lipase: 30 U/L (ref 11–51)

## 2020-10-26 LAB — CBG MONITORING, ED: Glucose-Capillary: 223 mg/dL — ABNORMAL HIGH (ref 70–99)

## 2020-10-26 MED ORDER — IOHEXOL 300 MG/ML  SOLN
100.0000 mL | Freq: Once | INTRAMUSCULAR | Status: AC | PRN
  Administered 2020-10-26: 100 mL via INTRAVENOUS

## 2020-10-26 MED ORDER — IOHEXOL 9 MG/ML PO SOLN
1000.0000 mL | ORAL | Status: AC | PRN
  Administered 2020-10-26: 1000 mL via ORAL

## 2020-10-26 MED ORDER — SODIUM CHLORIDE 0.9 % IV BOLUS
500.0000 mL | Freq: Once | INTRAVENOUS | Status: AC
  Administered 2020-10-26: 500 mL via INTRAVENOUS

## 2020-10-26 NOTE — Telephone Encounter (Signed)
Patient currently under evaluation in [facility] ED.

## 2020-10-26 NOTE — ED Provider Notes (Signed)
[facility] Emergency Department Provider Note   ____________________________________________   First MD Initiated Contact with Patient 10/26/20 1025     (approximate)  I have reviewed the triage vital signs and the nursing notes.   HISTORY  Chief Complaint Chest Pain, Near Syncope, and Shortness of Breath    HPI Martin [place] is a 64 y.o. male history of diabetes, anginal pain, insulin-dependent hypertension hypercholesterolemia   Patient presents for evaluation reports that for about 2 months now he has been experiencing abdominal discomfort lower abdomen cramping, and nausea.  At times his stool seems slightly dark, he is having loose stools regularly.  He is also noticed over the last week or 2 that he has had urgency to urinate.  He also has been having some intermittent left-sided chest discomfort she describes as an achiness off and on for the last several days.  Last couple days he is felt so lightheaded that he just feels like he could almost pass out at times but has not.  He is still eating and drinking, reports he is got good appetite but continues to lose about 8 pounds over the last 2 months.  He is talked to his primary doctor about this and tried to address that but has not been able to determine cause.  Continues to feel like he is just slowly worsening.  No fevers or chills.  Denies any travel.  No vomiting has mild nausea at times.  Mild chest discomfort for last several days of her left-sided chest does not radiate, does not appear to be exertional.  Does feel like he gets short of breath easier than typical over the last several weeks as well.   Currently reports just a mildly achy pain below his bellybutton  Past Medical History:  Diagnosis Date  . Anginal pain ([place])   . Anxiety   . Arthritis   . Diabetes mellitus without complication ([place])   . Headache   . Hypercholesteremia   . Hypertension   . Insulin pump in place   .  Neuropathy due to secondary diabetes mellitus ([place])   . PONV (postoperative nausea and vomiting)     Patient Active Problem List   Diagnosis Date Noted  . Preventative health care 01/08/2019  . Nausea 09/03/2018  . Chronic neck pain 04/01/2018  . Anxiety and depression 04/01/2018  . 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 Weak urinary stream 12/26/2016  . Hyperlipidemia 12/26/2016  . Essential hypertension 12/26/2016  . Migraines 12/26/2016  . Chest pain 12/26/2016  . Type 1 diabetes ([place]) 12/26/2016    Past Surgical History:  Procedure Laterality Date  . APPENDECTOMY    . CARDIAC CATHETERIZATION    . CERVICAL FUSION  2005, 2006   [facility] Dr. Glenna Fellows  . COLONOSCOPY WITH PROPOFOL N/A 05/12/2018   Procedure: COLONOSCOPY WITH PROPOFOL;  Surgeon: Lucilla Lame, MD;  Location: [facility] ENDOSCOPY;  Service: Endoscopy;  Laterality: N/A;  . FRACTURE SURGERY Right August 05, 1986   [place]  . 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 Authorizing Provider  aspirin EC 81 MG tablet Take 81 mg by mouth daily.    [provider]  gabapentin (NEURONTIN) 800 MG tablet TAKE 1 TABLET BY MOUTH THREE TIMES DAILY FOR  NEUROPATHIC  PAIN 05/16/20   Pleas Koch, NP  Insulin Human (INSULIN PUMP) SOLN Inject into the skin every hour. Humalog Insulin Pump--patient stated he has five different rates depending on the time of day    [provider]  insulin lispro (HUMALOG) 100 UNIT/ML injection SMARTSIG:0-80 Unit(s) SUB-Q Daily 03/24/20   [provider]  lisinopril (ZESTRIL) 20 MG tablet Take 20 mg by mouth daily. 03/27/20   [provider]  promethazine (PHENERGAN) 12.5 MG tablet TAKE 1 TO 2 TABLETS BY MOUTH EVERY 8 HOURS AS NEEDED FOR NAUSEA  AND VOMITING 05/16/20   Pleas Koch, NP  sertraline (ZOLOFT) 100 MG tablet Take 1 tablet by mouth once daily 05/16/20   Pleas Koch, NP  simvastatin (ZOCOR) 40 MG tablet Take 1 tablet (40 mg total) by mouth every evening. For cholesterol. 08/24/20   Pleas Koch, NP    Allergies Codeine  Family History  Problem Relation Age of Onset  . Heart disease Father   . Heart attack Father 6  . Penile cancer Father   . Arrhythmia Brother     Social History Social History   Tobacco Use  . Smoking status: Former Smoker    Packs/day: 1.00    Types: Cigarettes    Quit date: 03/16/1997    Years since quitting: 23.6  . Smokeless tobacco: Never Used  Substance Use Topics  . Alcohol use: No  . Drug use: Yes    Types: Marijuana    Review of Systems Constitutional: No fever/chills Eyes: No visual changes. ENT: No sore throat. Cardiovascular: See HPI Respiratory: Denies shortness of breath. Gastrointestinal: See HPI Genitourinary: Negative for dysuria. Musculoskeletal: Negative for back pain. Skin: Negative for rash. Neurological: Negative for headaches, areas of focal weakness or numbness except he is experienced some tingling and burning in his feet over the last few months but is working with both neurosurgery and the foot specialist thus far believed to be diabetic neuropathy.    ____________________________________________   PHYSICAL EXAM:  VITAL SIGNS: ED Triage Vitals  Enc Vitals Group     BP 10/26/20 1000 (!) 150/84     Pulse Rate 10/26/20 1000 72     Resp 10/26/20 1000 20     Temp 10/26/20 1000 98 F (36.7 C)     Temp src --      SpO2 10/26/20 1000 95 %     Weight 10/26/20 1001 155 lb (70.3 kg)     Height 10/26/20 1001 5\' 10"  (1.778 m)     Head Circumference --      Peak Flow --      Pain Score 10/26/20 1001 7     Pain Loc --      Pain Edu? --      Excl. in [place]? --     Constitutional: Alert and oriented. Well appearing and in no acute distress.   He does not appear cachectic Eyes: Conjunctivae are normal. Head: Atraumatic. Nose: No congestion/rhinnorhea. Mouth/Throat: Mucous membranes are moist. Neck: No stridor.  Cardiovascular: Normal rate, regular rhythm. Grossly normal heart sounds.  Good peripheral circulation. Respiratory: Normal respiratory effort.  No retractions. Lungs CTAB. Gastrointestinal: Soft and nontender except in the suprapubic region reports some mild discomfort but no distention. No distention. Musculoskeletal: No lower extremity tenderness nor edema.  Strong dorsalis pedis and posterior tibial pulse left foot with normal capillary refill.  Demonstrates good use of the muscles of the left foot in general. Neurologic:  Normal speech and language.  No gross focal neurologic deficits are appreciated.  Skin:  Skin is warm, dry and intact. No rash noted. Psychiatric: Mood and affect are normal. Speech and behavior are normal.  ____________________________________________   LABS (all labs ordered are listed, but only abnormal results are displayed)  Labs Reviewed  BASIC METABOLIC PANEL - Abnormal; Notable for the following components:      Result Value   Sodium 132 (*)    Glucose, Bld 215 (*)    Calcium 8.4 (*)    All other components within normal limits  CBC - Abnormal; Notable for the following components:   RBC 4.18 (*)    HCT 38.8 (*)    All other components within normal limits  URINALYSIS, COMPLETE (UACMP) WITH MICROSCOPIC - Abnormal; Notable for the following components:   Color, Urine COLORLESS (*)    APPearance CLEAR (*)    Glucose, UA 50 (*)    All other components within normal limits  CBG MONITORING, ED - Abnormal; Notable for the following components:   Glucose-Capillary 223 (*)    All other components within normal limits  HEPATIC FUNCTION PANEL  LIPASE, BLOOD  TROPONIN I (HIGH SENSITIVITY)  TROPONIN I (HIGH SENSITIVITY)   ____________________________________________  EKG  Reviewed  entered by me at 959 heart rate 79 QRS 99 QTc 430 Normal EKG no evidence ischemia ____________________________________________  RADIOLOGY  DG Chest 2 View  Result Date: 10/26/2020 CLINICAL DATA:  Chest pain. Near syncope. Shortness of breath with exertion. EXAM: CHEST - 2 VIEW COMPARISON:  08/23/2014 FINDINGS: Heart size is normal. Mediastinal shadows are normal. The lungs are clear. Chronic pectus deformity of the chest. No acute bone finding. IMPRESSION: No active disease. Chronic pectus deformity of the chest. Electronically Signed   By: Nelson Chimes M.D.   On: 10/26/2020 10:51   CT ABDOMEN PELVIS W CONTRAST  Result Date: 10/26/2020 CLINICAL DATA:  Nausea and vomiting. Unintentional weight loss. Dizziness. EXAM: CT ABDOMEN AND PELVIS WITH CONTRAST TECHNIQUE: Multidetector CT imaging of the abdomen and pelvis was performed using the standard protocol following bolus administration of intravenous contrast. CONTRAST:  139mL OMNIPAQUE IOHEXOL 300 MG/ML  SOLN COMPARISON:  Noncontrast CT on 06/25/2019 FINDINGS: Lower Chest: No acute findings. Stable tiny less than 5 mm left lower lobe pulmonary nodule, consistent with benign etiology. Hepatobiliary: No hepatic masses identified. Scattered tiny cysts are seen in both right and left lobes, largest in the inferior right lobe measuring 1.9 cm. Gallbladder is unremarkable. No evidence of biliary ductal dilatation. Pancreas:  No mass or inflammatory changes. Spleen: Within normal limits in size and appearance. Adrenals/Urinary Tract: No masses identified. No evidence of ureteral calculi or hydronephrosis. Stomach/Bowel: No evidence of obstruction, inflammatory process or abnormal fluid collections. Vascular/Lymphatic: No pathologically enlarged lymph nodes. No abdominal aortic aneurysm. Aortic atherosclerotic calcification noted. Reproductive: Partially obscured by beam hardening artifact through the lower pelvis from left hip prosthesis. No abnormality  identified. Other:  None. Musculoskeletal:  No suspicious bone lesions identified. IMPRESSION: No acute findings or other significant abnormality identified. Aortic Atherosclerosis (ICD10-I70.0). Electronically Signed   By: Marlaine Hind M.D.   On: 10/26/2020 11:57     Chest x-ray reviewed negative for acute.  CT imaging reviewed by me, no acute findings. ____________________________________________   PROCEDURES  Procedure(s) performed: None  Procedures  Critical Care performed: No  ____________________________________________   INITIAL IMPRESSION / ASSESSMENT AND PLAN / ED COURSE  Pertinent labs & imaging results that were available during my care of the patient were reviewed  by me and considered in my medical decision making (see chart for details).   Patient seems to have multiple symptoms that have come what somewhat indolently.  He reports that the symptoms start about 2 months ago with a loosening of stools sometimes dark, nausea and progressively increasing fatigue for at least a month or 2.  He is also noticed some urinary Neri urgency over the last week, also experiencing some achiness that seems very atypical of ACS over the left side of the chest for several days.  He does also endorse weight loss and fatigue.  Etiology of the symptoms is not clear he is afebrile, normal white count and there is no obvious infectious symptoms.  I am concerned that underlying etiology such as metabolic, malignancy, diabetic, or other etiology may be present.  Denies any upper respiratory symptoms.  Does report slight fatigue or feeling winded easily with exertion as well.  He is seeing his primary care doctor but to this point has not yet found a cause.  Will work-up here obtain CT scan to further evaluate given associated etiology of lower abdominal pain loosening of his stools.  Also will hydrate, EKG thus far very reassuring will send troponin I doubt ACS at this point especially given several  days of symptoms associated with fatigue, atypical symptomatology  Communicated with primary care, Loma Boston regarding today's visit and request for close follow-up for further work-up.  Discussed with the patient he is in agreement.  Also recommended he follow-up with GI given association of his symptoms starting with changes in bowel habits as well as ongoing loose stools and weight loss.  Patient understand agreeable with plan  Patient fully awake alert and somewhat improved after fluids he is calm reports he feels reassured with today's work-up. Return precautions and treatment recommendations and follow-up discussed with the patient who is agreeable with the plan.       ____________________________________________   FINAL CLINICAL IMPRESSION(S) / ED DIAGNOSES  Final diagnoses:  Generalized weakness  Loose stools  Atypical chest pain        Note:  This document was prepared using Dragon voice recognition software and may include unintentional dictation errors       Delman Kitten, MD 10/26/20 1321

## 2020-10-26 NOTE — ED Triage Notes (Signed)
Pt arrives to ed via pov. Pt c/o CP, weakness lightheaded "feeling like Im going to pass out" x 10 days. Reports experiencing several bowel incontent episodes. Pt reports SOB that increases with exertion. Pt shake when applying leads for ekg and appeared to have increased RR after walking.  Hx of diabetes and on a insulin pump. CBG checked in triage.

## 2020-10-26 NOTE — ED Notes (Signed)
Pt reports chest pain off and on x2 weeks Pt increased today and pt presented to urgent care that sent him to the ED Pt c/o left sided chest pain that radiates intol the left arm and left jaw Pt c/o dizziness that brings on nausea  Pt awoke this am with the feeling that he was spinning/dizzy and unstable on his feet Pt reports fatigue and inability to focus/remeber normal task at work

## 2020-11-01 ENCOUNTER — Encounter: Payer: Self-pay | Admitting: Primary Care

## 2020-11-01 DIAGNOSIS — R1013 Epigastric pain: Secondary | ICD-10-CM | POA: Diagnosis not present

## 2020-11-01 DIAGNOSIS — E1065 Type 1 diabetes mellitus with hyperglycemia: Secondary | ICD-10-CM | POA: Diagnosis not present

## 2020-11-01 DIAGNOSIS — R634 Abnormal weight loss: Secondary | ICD-10-CM | POA: Diagnosis not present

## 2020-11-01 DIAGNOSIS — R195 Other fecal abnormalities: Secondary | ICD-10-CM | POA: Diagnosis not present

## 2020-11-23 DIAGNOSIS — G8929 Other chronic pain: Secondary | ICD-10-CM | POA: Diagnosis not present

## 2020-11-23 DIAGNOSIS — R110 Nausea: Secondary | ICD-10-CM | POA: Diagnosis not present

## 2020-11-23 DIAGNOSIS — Z87891 Personal history of nicotine dependence: Secondary | ICD-10-CM | POA: Diagnosis not present

## 2020-11-23 DIAGNOSIS — Z9641 Presence of insulin pump (external) (internal): Secondary | ICD-10-CM | POA: Diagnosis not present

## 2020-11-23 DIAGNOSIS — Z7982 Long term (current) use of aspirin: Secondary | ICD-10-CM | POA: Diagnosis not present

## 2020-11-23 DIAGNOSIS — R195 Other fecal abnormalities: Secondary | ICD-10-CM | POA: Diagnosis not present

## 2020-11-23 DIAGNOSIS — R21 Rash and other nonspecific skin eruption: Secondary | ICD-10-CM | POA: Diagnosis not present

## 2020-11-23 DIAGNOSIS — I10 Essential (primary) hypertension: Secondary | ICD-10-CM | POA: Diagnosis not present

## 2020-11-23 DIAGNOSIS — M47812 Spondylosis without myelopathy or radiculopathy, cervical region: Secondary | ICD-10-CM | POA: Diagnosis not present

## 2020-11-23 DIAGNOSIS — R1013 Epigastric pain: Secondary | ICD-10-CM | POA: Diagnosis not present

## 2020-11-23 DIAGNOSIS — E119 Type 2 diabetes mellitus without complications: Secondary | ICD-10-CM | POA: Diagnosis not present

## 2020-11-23 DIAGNOSIS — M6281 Muscle weakness (generalized): Secondary | ICD-10-CM | POA: Diagnosis not present

## 2020-11-23 DIAGNOSIS — F419 Anxiety disorder, unspecified: Secondary | ICD-10-CM | POA: Diagnosis not present

## 2020-11-23 DIAGNOSIS — R410 Disorientation, unspecified: Secondary | ICD-10-CM | POA: Diagnosis not present

## 2020-11-23 DIAGNOSIS — R634 Abnormal weight loss: Secondary | ICD-10-CM | POA: Diagnosis not present

## 2020-11-23 DIAGNOSIS — F1210 Cannabis abuse, uncomplicated: Secondary | ICD-10-CM | POA: Diagnosis not present

## 2020-11-23 DIAGNOSIS — D649 Anemia, unspecified: Secondary | ICD-10-CM | POA: Diagnosis not present

## 2020-11-23 DIAGNOSIS — E785 Hyperlipidemia, unspecified: Secondary | ICD-10-CM | POA: Diagnosis not present

## 2020-11-27 ENCOUNTER — Other Ambulatory Visit: Payer: Self-pay | Admitting: Primary Care

## 2020-11-27 DIAGNOSIS — R110 Nausea: Secondary | ICD-10-CM

## 2020-11-27 DIAGNOSIS — F32A Depression, unspecified: Secondary | ICD-10-CM

## 2020-11-30 DIAGNOSIS — M5000 Cervical disc disorder with myelopathy, unspecified cervical region: Secondary | ICD-10-CM | POA: Diagnosis not present

## 2020-12-12 DIAGNOSIS — M4802 Spinal stenosis, cervical region: Secondary | ICD-10-CM | POA: Diagnosis not present

## 2020-12-12 DIAGNOSIS — G5602 Carpal tunnel syndrome, left upper limb: Secondary | ICD-10-CM | POA: Diagnosis not present

## 2020-12-12 DIAGNOSIS — G5622 Lesion of ulnar nerve, left upper limb: Secondary | ICD-10-CM | POA: Diagnosis not present

## 2020-12-12 DIAGNOSIS — M4712 Other spondylosis with myelopathy, cervical region: Secondary | ICD-10-CM | POA: Diagnosis not present

## 2020-12-12 DIAGNOSIS — M50221 Other cervical disc displacement at C4-C5 level: Secondary | ICD-10-CM | POA: Diagnosis not present

## 2020-12-12 DIAGNOSIS — M5000 Cervical disc disorder with myelopathy, unspecified cervical region: Secondary | ICD-10-CM | POA: Diagnosis not present

## 2020-12-12 DIAGNOSIS — M50021 Cervical disc disorder at C4-C5 level with myelopathy: Secondary | ICD-10-CM | POA: Diagnosis not present

## 2021-01-03 DIAGNOSIS — E782 Mixed hyperlipidemia: Secondary | ICD-10-CM | POA: Diagnosis not present

## 2021-01-03 DIAGNOSIS — E1065 Type 1 diabetes mellitus with hyperglycemia: Secondary | ICD-10-CM | POA: Diagnosis not present

## 2021-01-03 DIAGNOSIS — Z6823 Body mass index (BMI) 23.0-23.9, adult: Secondary | ICD-10-CM | POA: Diagnosis not present

## 2021-01-03 DIAGNOSIS — I10 Essential (primary) hypertension: Secondary | ICD-10-CM | POA: Diagnosis not present

## 2021-01-08 DIAGNOSIS — M5412 Radiculopathy, cervical region: Secondary | ICD-10-CM | POA: Diagnosis not present

## 2021-02-27 DIAGNOSIS — M5412 Radiculopathy, cervical region: Secondary | ICD-10-CM | POA: Diagnosis not present

## 2021-03-01 ENCOUNTER — Other Ambulatory Visit: Payer: Self-pay | Admitting: Primary Care

## 2021-03-01 DIAGNOSIS — F32A Depression, unspecified: Secondary | ICD-10-CM

## 2021-03-01 DIAGNOSIS — F419 Anxiety disorder, unspecified: Secondary | ICD-10-CM

## 2021-03-01 DIAGNOSIS — R110 Nausea: Secondary | ICD-10-CM

## 2021-03-01 NOTE — Telephone Encounter (Signed)
Patient is overdue for CPE/follow up. Will need to be seen for further refills. Please schedule.

## 2021-03-05 NOTE — Telephone Encounter (Signed)
Called patient and scheduled cpe for 03/09/21.

## 2021-03-09 ENCOUNTER — Encounter: Payer: BC Managed Care – PPO | Admitting: Primary Care

## 2021-03-28 DIAGNOSIS — M5000 Cervical disc disorder with myelopathy, unspecified cervical region: Secondary | ICD-10-CM | POA: Diagnosis not present

## 2021-04-11 DIAGNOSIS — Z6822 Body mass index (BMI) 22.0-22.9, adult: Secondary | ICD-10-CM | POA: Diagnosis not present

## 2021-04-11 DIAGNOSIS — I10 Essential (primary) hypertension: Secondary | ICD-10-CM | POA: Diagnosis not present

## 2021-04-11 DIAGNOSIS — E1065 Type 1 diabetes mellitus with hyperglycemia: Secondary | ICD-10-CM | POA: Diagnosis not present

## 2021-04-11 DIAGNOSIS — E782 Mixed hyperlipidemia: Secondary | ICD-10-CM | POA: Diagnosis not present

## 2021-04-11 LAB — HEMOGLOBIN A1C: Hemoglobin A1C: 8.2

## 2021-04-17 ENCOUNTER — Encounter: Payer: BC Managed Care – PPO | Admitting: Primary Care

## 2021-05-03 ENCOUNTER — Encounter: Payer: Self-pay | Admitting: Primary Care

## 2021-05-03 ENCOUNTER — Other Ambulatory Visit: Payer: Self-pay

## 2021-05-03 ENCOUNTER — Ambulatory Visit (INDEPENDENT_AMBULATORY_CARE_PROVIDER_SITE_OTHER): Payer: BC Managed Care – PPO | Admitting: Primary Care

## 2021-05-03 VITALS — BP 138/72 | HR 82 | Temp 98.5°F | Ht 70.0 in | Wt 163.0 lb

## 2021-05-03 DIAGNOSIS — R110 Nausea: Secondary | ICD-10-CM

## 2021-05-03 DIAGNOSIS — E1040 Type 1 diabetes mellitus with diabetic neuropathy, unspecified: Secondary | ICD-10-CM

## 2021-05-03 DIAGNOSIS — Z125 Encounter for screening for malignant neoplasm of prostate: Secondary | ICD-10-CM | POA: Diagnosis not present

## 2021-05-03 DIAGNOSIS — Z0000 Encounter for general adult medical examination without abnormal findings: Secondary | ICD-10-CM | POA: Diagnosis not present

## 2021-05-03 DIAGNOSIS — M542 Cervicalgia: Secondary | ICD-10-CM | POA: Diagnosis not present

## 2021-05-03 DIAGNOSIS — F419 Anxiety disorder, unspecified: Secondary | ICD-10-CM | POA: Diagnosis not present

## 2021-05-03 DIAGNOSIS — E785 Hyperlipidemia, unspecified: Secondary | ICD-10-CM

## 2021-05-03 DIAGNOSIS — F32A Depression, unspecified: Secondary | ICD-10-CM

## 2021-05-03 DIAGNOSIS — I10 Essential (primary) hypertension: Secondary | ICD-10-CM

## 2021-05-03 DIAGNOSIS — M8949 Other hypertrophic osteoarthropathy, multiple sites: Secondary | ICD-10-CM | POA: Diagnosis not present

## 2021-05-03 DIAGNOSIS — G8929 Other chronic pain: Secondary | ICD-10-CM

## 2021-05-03 DIAGNOSIS — M150 Primary generalized (osteo)arthritis: Secondary | ICD-10-CM

## 2021-05-03 DIAGNOSIS — M159 Polyosteoarthritis, unspecified: Secondary | ICD-10-CM

## 2021-05-03 LAB — LIPID PANEL
Cholesterol: 141 mg/dL (ref 0–200)
HDL: 59 mg/dL (ref >39.00)
LDL Cholesterol: 75 mg/dL (ref 0–99)
NonHDL: 82.13
Total CHOL/HDL Ratio: 2
Triglycerides: 36 mg/dL (ref 0.0–149.0)
VLDL: 7.2 mg/dL (ref 0.0–40.0)

## 2021-05-03 LAB — COMPREHENSIVE METABOLIC PANEL
ALT: 16 U/L (ref 0–53)
AST: 18 U/L (ref 0–37)
Albumin: 4.3 g/dL (ref 3.5–5.2)
Alkaline Phosphatase: 74 U/L (ref 39–117)
BUN: 8 mg/dL (ref 6–23)
CO2: 30 mEq/L (ref 19–32)
Calcium: 9.4 mg/dL (ref 8.4–10.5)
Chloride: 102 mEq/L (ref 96–112)
Creatinine, Ser: 0.84 mg/dL (ref 0.40–1.50)
GFR: 92.74 mL/min (ref >60.00)
Glucose, Bld: 155 mg/dL — ABNORMAL HIGH (ref 70–99)
Potassium: 4.6 mEq/L (ref 3.5–5.1)
Sodium: 137 mEq/L (ref 135–145)
Total Bilirubin: 0.6 mg/dL (ref 0.2–1.2)
Total Protein: 6.4 g/dL (ref 6.0–8.3)

## 2021-05-03 LAB — PSA: PSA: 2.4 ng/mL (ref 0.10–4.00)

## 2021-05-03 MED ORDER — LISINOPRIL 30 MG PO TABS: 30.0000 mg | ORAL_TABLET | Freq: Every day | ORAL | 3 refills | Status: DC

## 2021-05-03 MED ORDER — CITALOPRAM HYDROBROMIDE 20 MG PO TABS: 20.0000 mg | ORAL_TABLET | Freq: Every day | ORAL | 0 refills | Status: DC

## 2021-05-03 NOTE — Progress Notes (Signed)
Subjective:    Patient ID: Martin Deleon, male    DOB: 1956/02/11, 65 y.o.   MRN: [id number]  HPI  Martin Deleon is a very pleasant 65 y.o. male who presents today for complete physical.  He is checking his BP at home which running high 130's/80's. He is compliant to his lisinopril 20 mg daily. He has a history of migraines and is experiencing a "pressure headache" daily behind his eyes. This feels different than his migraine headaches.    He stopped his sertraline four months ago as he ran out of his medication. Once he was off of his medication he didn't feel any different than when taking. He believes he does need treatment for symptoms of irritability, feeling anxious/nervous, feeling down, worrying. He was once managed on clonazepam and did very well on this regimen.   PHQ9 SCORE ONLY 05/03/2021 04/01/2018  PHQ-9 Total Score 15 20   GAD 7 : Generalized Anxiety Score 05/03/2021 04/01/2018  Nervous, Anxious, on Edge 3 3  Control/stop worrying 3 3  Worry too much - different things 3 3  Trouble relaxing 3 2  Restless 3 2  Easily annoyed or irritable 3 3  Afraid - awful might happen 3 3  Total GAD 7 Score 21 19  Anxiety Difficulty Very difficult Very difficult      Immunizations: -Tetanus: 2020 -Influenza: Completed this season  -Covid-19: Completed 2 vaccines -Shingles: Never completed, declines today -Pneumonia: 2008   Diet: He endorses a healthy diet.  Exercise: He is walking 10,000 steps daily  Eye exam: Due, he will schedule  Dental exam: Completes semi-annually   Colonoscopy: 2019, due 2029 PSA: Due   BP Readings from Last 3 Encounters:  05/03/21 138/72  10/26/20 140/82  08/24/20 132/78       Review of Systems  Constitutional: Negative for unexpected weight change.  HENT: Negative for rhinorrhea.   Eyes: Negative for visual disturbance.  Respiratory: Negative for shortness of breath.   Cardiovascular: Negative for chest pain.  Gastrointestinal:  Negative for constipation and diarrhea.  Genitourinary: Negative for difficulty urinating.  Musculoskeletal: Negative for arthralgias.  Skin: Negative for rash.  Allergic/Immunologic: Negative for environmental allergies.  Neurological: Negative for dizziness, numbness and headaches.  Psychiatric/Behavioral: The patient is nervous/anxious.        See HPI         Past Medical History:  Diagnosis Date  . Anginal pain ([place])   . Anxiety   . Arthritis   . Diabetes mellitus without complication ([place])   . Headache   . Hypercholesteremia   . Hypertension   . Insulin pump in place   . Neuropathy due to secondary diabetes mellitus ([place])   . PONV (postoperative nausea and vomiting)     Social History   Socioeconomic History  . Marital status: Married    Spouse name: Not on file  . Number of children: Not on file  . Years of education: Not on file  . Highest education level: Not on file  Occupational History  . Not on file  Tobacco Use  . Smoking status: Former Smoker    Packs/day: 1.00    Types: Cigarettes    Quit date: 03/16/1997    Years since quitting: 24.1  . Smokeless tobacco: Never Used  Substance and Sexual Activity  . Alcohol use: No  . Drug use: Yes    Types: Marijuana  . Sexual activity: Not on file  Other Topics Concern  . Not on file  Social History Narrative   Married.   2 children.   Works as a Nature conservation officer.   Enjoys golfing.    Social Determinants of Health   Financial Resource Strain: Not on file  Food Insecurity: Not on file  Transportation Needs: Not on file  Physical Activity: Not on file  Stress: Not on file  Social Connections: Not on file  Intimate Partner Violence: Not on file    Past Surgical History:  Procedure Laterality Date  . APPENDECTOMY    . CARDIAC CATHETERIZATION    . CERVICAL FUSION  2005, 2006   [facility] Dr. Glenna Deleon  . COLONOSCOPY WITH PROPOFOL N/A 05/12/2018   Procedure: COLONOSCOPY WITH PROPOFOL;  Surgeon: Martin Lame, MD;  Location: [facility] ENDOSCOPY;  Service: Endoscopy;  Laterality: N/A;  . FRACTURE SURGERY Right August 05, 1986   Elbow  . 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 Heart disease Father   . Heart attack Father 43  . Penile cancer Father   . Arrhythmia Brother     Allergies  Allergen Reactions  . Codeine Nausea And Vomiting    Current Outpatient Medications on File Prior to Visit  Medication Sig Dispense Refill  . gabapentin (NEURONTIN) 800 MG tablet TAKE 1 TABLET BY MOUTH THREE TIMES DAILY FOR  NEUROPATHIC  PAIN 90 tablet 0  . Insulin Human (INSULIN PUMP) SOLN Inject into the skin every hour. Humalog Insulin Pump--patient stated he has five different rates depending on the time of day    . insulin lispro (HUMALOG) 100 UNIT/ML injection SMARTSIG:0-80 Unit(s) SUB-Q Daily    . lisinopril (ZESTRIL) 20 MG tablet Take 20 mg by mouth daily.    . promethazine (PHENERGAN) 12.5 MG tablet TAKE 1 TO 2 TABLETS BY MOUTH EVERY 8 HOURS AS NEEDED FOR NAUSEA AND VOMITING 20 tablet 0  . simvastatin (ZOCOR) 40 MG tablet Take 1 tablet (40 mg total) by mouth every evening. For cholesterol. 90 tablet 1  . methocarbamol (ROBAXIN) 500 MG tablet Take 1 tablet by mouth 3 (three) times daily.    . sertraline (ZOLOFT) 100 MG tablet Take 1 tablet (100 mg total) by mouth daily. For anxiety and depression. (Patient not taking: Reported on 05/03/2021) 30 tablet 0   No current facility-administered medications on file prior to visit.    BP 138/72   Pulse 82   Temp 98.5 F (36.9 C) (Temporal)   Ht 5\' 10"  (1.778 m)   Wt 163 lb (73.9 kg)   SpO2 98%   BMI 23.39 kg/m  Objective:   Physical Exam HENT:     Right Ear: Tympanic membrane and ear canal normal.     Left Ear: Tympanic membrane and ear canal normal.     Nose: Nose  normal.     Right Sinus: No maxillary sinus tenderness or frontal sinus tenderness.     Left Sinus: No maxillary sinus tenderness or frontal sinus tenderness.  Eyes:     Conjunctiva/sclera: Conjunctivae normal.     Pupils: Pupils are equal, round, and reactive to light.  Neck:     Thyroid: No thyromegaly.     Vascular: No carotid bruit.  Cardiovascular:     Rate and Rhythm: Normal rate and regular rhythm.     Heart sounds: Normal heart sounds.  Pulmonary:  Effort: Pulmonary effort is normal.     Breath sounds: Normal breath sounds. No wheezing or rales.  Abdominal:     General: Bowel sounds are normal.     Palpations: Abdomen is soft.     Tenderness: There is no abdominal tenderness.  Musculoskeletal:        General: Normal range of motion.     Cervical back: Neck supple.  Skin:    General: Skin is warm and dry.  Neurological:     Mental Status: He is alert and oriented to person, place, and time.     Cranial Nerves: No cranial nerve deficit.     Deep Tendon Reflexes: Reflexes are normal and symmetric.  Psychiatric:        Mood and Affect: Mood normal.           Assessment & Plan:      This visit occurred during the SARS-CoV-2 public health emergency.  Safety protocols were in place, including screening questions prior to the visit, additional usage of staff PPE, and extensive cleaning of exam room while observing appropriate contact time as indicated for disinfecting solutions.

## 2021-05-03 NOTE — Assessment & Plan Note (Signed)
Significant improvement since surgical interventions in late 2021.  Continue PRN methocarbamol 500 mg and gabapentin 800 mg 2-3 times daily.

## 2021-05-03 NOTE — Assessment & Plan Note (Signed)
Using promethazine 12.5 mg PRN during episodes of hypoglycemia or migraines. Continue same.

## 2021-05-03 NOTE — Assessment & Plan Note (Signed)
Declines Shingles vaccines. Other vaccines UTD. PSA due and pending. Colonoscopy UTD, due in 2029.  Encouraged a healthy diet and regular exercise.  Exam today stable. Labs pending.

## 2021-05-03 NOTE — Assessment & Plan Note (Signed)
Deteriorated and has been off of Zoloft for four months. Ineffective on Zoloft. Discussed that clonazepam is not best practice for daily use.  Will trial citalopram 20 mg, he will update via My Chart.

## 2021-05-03 NOTE — Assessment & Plan Note (Signed)
Improved since surgical procedures in late December 2021.  Doing well on methocarbamol 500 mg for which he uses sparingly. Doing well on gabapentin 800 mg twice daily mostly, sometimes three times daily.   Continue current regimen.

## 2021-05-03 NOTE — Assessment & Plan Note (Signed)
Follows with endocrinology, doing well on Humalog insulin pump. He is also working to get a Retail banker.   He will schedule an eye exam. Managed on ACE-I.

## 2021-05-03 NOTE — Assessment & Plan Note (Signed)
Above goal in the office today, also with home readings. Increase lisinopril to 30 mg, new Rx provided. He will monitor and update if readings remain at or above 130/90.

## 2021-05-03 NOTE — Patient Instructions (Signed)
Stop by the lab prior to leaving today. I will notify you of your results once received.   We increased the dose of your lisinopril to 30 mg. I sent a new prescription to the pharmacy.  Start citalopram (Celexa) 20 mg. Take 1/2 tablet daily for about one week, then increase to 1 full tablet thereafter.  Please update me via my chart regarding your blood pressure in 2 weeks.  Please update me via my chart regarding your anxiety and depression in 4-6 weeks.  It was a pleasure to see you today!   Preventive Care 54-26 Years Old, Male Preventive care refers to lifestyle choices and visits with your health care provider that can promote health and wellness. This includes:  A yearly physical exam. This is also called an annual wellness visit.  Regular dental and eye exams.  Immunizations.  Screening for certain conditions.  Healthy lifestyle choices, such as: ? Eating a healthy diet. ? Getting regular exercise. ? Not using drugs or products that contain nicotine and tobacco. ? Limiting alcohol use. What can I expect for my preventive care visit? Physical exam Your health care provider will check your:  Height and weight. These may be used to calculate your BMI (body mass index). BMI is a measurement that tells if you are at a healthy weight.  Heart rate and blood pressure.  Body temperature.  Skin for abnormal spots. Counseling Your health care provider may ask you questions about your:  Past medical problems.  Family's medical history.  Alcohol, tobacco, and drug use.  Emotional well-being.  Home life and relationship well-being.  Sexual activity.  Diet, exercise, and sleep habits.  Work and work Statistician.  Access to firearms. What immunizations do I need? Vaccines are usually given at various ages, according to a schedule. Your health care provider will recommend vaccines for you based on your age, medical history, and lifestyle or other factors, such as  travel or where you work.   What tests do I need? Blood tests  Lipid and cholesterol levels. These may be checked every 5 years, or more often if you are over 21 years old.  Hepatitis C test.  Hepatitis B test. Screening  Lung cancer screening. You may have this screening every year starting at age 75 if you have a 30-pack-year history of smoking and currently smoke or have quit within the past 15 years.  Prostate cancer screening. Recommendations will vary depending on your family history and other risks.  Genital exam to check for testicular cancer or hernias.  Colorectal cancer screening. ? All adults should have this screening starting at age 61 and continuing until age 64. ? Your health care provider may recommend screening at age 80 if you are at increased risk. ? You will have tests every 1-10 years, depending on your results and the type of screening test.  Diabetes screening. ? This is done by checking your blood sugar (glucose) after you have not eaten for a while (fasting). ? You may have this done every 1-3 years.  STD (sexually transmitted disease) testing, if you are at risk. Follow these instructions at home: Eating and drinking  Eat a diet that includes fresh fruits and vegetables, whole grains, lean protein, and low-fat dairy products.  Take vitamin and mineral supplements as recommended by your health care provider.  Do not drink alcohol if your health care provider tells you not to drink.  If you drink alcohol: ? Limit how much you have to 0-2  drinks a day. ? Be aware of how much alcohol is in your drink. In the [place] one drink equals one 12 oz bottle of beer (355 mL), one 5 oz glass of wine (148 mL), or one 1 oz glass of hard liquor (44 mL).   Lifestyle  Take daily care of your teeth and gums. Brush your teeth every morning and night with fluoride toothpaste. Floss one time each day.  Stay active. Exercise for at least 30 minutes 5 or more days each  week.  Do not use any products that contain nicotine or tobacco, such as cigarettes, e-cigarettes, and chewing tobacco. If you need help quitting, ask your health care provider.  Do not use drugs.  If you are sexually active, practice safe sex. Use a condom or other form of protection to prevent STIs (sexually transmitted infections).  If told by your health care provider, take low-dose aspirin daily starting at age 32.  Find healthy ways to cope with stress, such as: ? Meditation, yoga, or listening to music. ? Journaling. ? Talking to a trusted person. ? Spending time with friends and family. Safety  Always wear your seat belt while driving or riding in a vehicle.  Do not drive: ? If you have been drinking alcohol. Do not ride with someone who has been drinking. ? When you are tired or distracted. ? While texting.  Wear a helmet and other protective equipment during sports activities.  If you have firearms in your house, make sure you follow all gun safety procedures. What's next?  Go to your health care provider once a year for an annual wellness visit.  Ask your health care provider how often you should have your eyes and teeth checked.  Stay up to date on all vaccines. This information is not intended to replace advice given to you by your health care provider. Make sure you discuss any questions you have with your health care provider. Document Revised: 08/31/2019 Document Reviewed: 11/26/2018 Elsevier Patient Education  2021 [employer].

## 2021-05-03 NOTE — Assessment & Plan Note (Signed)
Compliant to simvastatin 40 mg, continue same. Lipid panel pending.

## 2021-05-09 DIAGNOSIS — E1065 Type 1 diabetes mellitus with hyperglycemia: Secondary | ICD-10-CM | POA: Diagnosis not present

## 2021-05-22 DIAGNOSIS — F419 Anxiety disorder, unspecified: Secondary | ICD-10-CM

## 2021-05-22 NOTE — Telephone Encounter (Signed)
Added to open my chart message.

## 2021-05-22 NOTE — Telephone Encounter (Signed)
Added from a my chart message sent same day from patent.   The new medication for stress and patience is not working. ? What can we do I feel no different.

## 2021-05-24 ENCOUNTER — Other Ambulatory Visit: Payer: Self-pay

## 2021-05-24 ENCOUNTER — Ambulatory Visit (INDEPENDENT_AMBULATORY_CARE_PROVIDER_SITE_OTHER): Payer: BC Managed Care – PPO | Admitting: Primary Care

## 2021-05-24 VITALS — BP 110/58 | HR 94 | Temp 98.2°F | Ht 70.0 in | Wt 160.0 lb

## 2021-05-24 DIAGNOSIS — I10 Essential (primary) hypertension: Secondary | ICD-10-CM

## 2021-05-24 DIAGNOSIS — H9313 Tinnitus, bilateral: Secondary | ICD-10-CM | POA: Diagnosis not present

## 2021-05-24 DIAGNOSIS — F32A Depression, unspecified: Secondary | ICD-10-CM | POA: Diagnosis not present

## 2021-05-24 DIAGNOSIS — F419 Anxiety disorder, unspecified: Secondary | ICD-10-CM | POA: Diagnosis not present

## 2021-05-24 MED ORDER — FLUTICASONE PROPIONATE 50 MCG/ACT NA SUSP: 1.0000 | Freq: Two times a day (BID) | NASAL | 0 refills | Status: DC

## 2021-05-24 MED ORDER — DULOXETINE HCL 20 MG PO CPEP: 20.0000 mg | ORAL_CAPSULE | Freq: Every day | ORAL | 0 refills | Status: DC

## 2021-05-24 NOTE — Assessment & Plan Note (Signed)
Seems to be experiencing side effects from citalopram, will discontinue and start duloxetine 20 mg.   He will update in a few weeks, consider dose increase if needed.

## 2021-05-24 NOTE — Progress Notes (Signed)
Subjective:    Patient ID: Martin Deleon, male    DOB: [birth year], 65 y.o.   MRN: [id number]  HPI  Martin Deleon is a very pleasant 65 y.o. male with a history of hypertension, migraines, type 1 diabetes, chest pain, chronic neck pain, anxiety and depression who presents today with multiple concerns.  1) Anxiety and Depression: Currently managed on citalopram 20 mg which was initiated three weeks ago during his CPE, Zoloft had become ineffective so we tried citalopram.  Since his last visit he's noticed a headache for 2-3 weeks, "confusion" at work, difficulty retaining things at work, irritability. He believes symptoms began when he started taking citalopram. We discussed switching to Cymbalta, he has researched this medication and would like to try.   2) Tinnitus: Chronic for the last 3-4 months. He's not taken anything OTC.   3) Essential Hypertension: Currently managed on lisinopril 30 mg for which was increased from 20 mg during his last visit. Since his last visit he continues to notice frontal headaches that began just after starting citalopram.   He is checking his BP at home which is running 120's/70's.   BP Readings from Last 3 Encounters:  05/24/21 (!) 110/58  05/03/21 138/72  10/26/20 140/82      Review of Systems  HENT:  Positive for tinnitus.   Respiratory:  Negative for shortness of breath.   Cardiovascular:  Negative for chest pain.  Neurological:  Positive for weakness.  Psychiatric/Behavioral:  The patient is nervous/anxious.        See HPI        Past Medical History:  Diagnosis Date   Anginal pain ([place])    Anxiety    Arthritis    Diabetes mellitus without complication ([facility])    Headache    Hypercholesteremia    Hypertension    Insulin pump in place    Neuropathy due to secondary diabetes mellitus ([facility])    PONV (postoperative nausea and vomiting)     Social History   Socioeconomic History   Marital status: Married    Spouse name: Not on  file   Number of children: Not on file   Years of education: Not on file   Highest education level: Not on file  Occupational History   Not on file  Tobacco Use   Smoking status: Former    Packs/day: 1.00    Pack years: 0.00    Types: Cigarettes    Quit date: 03/16/1997    Years since quitting: 24.2   Smokeless tobacco: Never  Substance and Sexual Activity   Alcohol use: No   Drug use: Yes    Types: Marijuana   Sexual activity: Not on file  Other Topics Concern   Not on file  Social History Narrative   Married.   2 children.   Works as a Nature conservation officer.   Enjoys golfing.    Social Determinants of Health   Financial Resource Strain: Not on file  Food Insecurity: Not on file  Transportation Needs: Not on file  Physical Activity: Not on file  Stress: Not on file  Social Connections: Not on file  Intimate Partner Violence: Not on file    Past Surgical History:  Procedure Laterality Date   [place]  2005, 2006    Dr. 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 For anxiety and depression. 90 tablet 0   gabapentin (NEURONTIN) 800 MG tablet TAKE 1 TABLET BY MOUTH THREE  TIMES DAILY FOR  NEUROPATHIC  PAIN 90 tablet 0   Insulin Human (INSULIN PUMP) SOLN Inject into the skin every hour. Humalog Insulin Pump--patient stated he has five different rates depending on the time of day     insulin lispro (HUMALOG) 100 UNIT/ML injection SMARTSIG:0-80 Unit(s) SUB-Q Daily     lisinopril (ZESTRIL) 30 MG tablet Take 1 tablet (30 mg total) by mouth daily. For blood pressure. 90 tablet 3   methocarbamol (ROBAXIN) 500 MG tablet Take 1 tablet by mouth 3 (three) times daily.     promethazine (PHENERGAN) 12.5 MG tablet TAKE 1 TO 2 TABLETS BY MOUTH EVERY 8 HOURS AS NEEDED FOR NAUSEA AND VOMITING 20 tablet 0   simvastatin (ZOCOR) 40 MG tablet Take 1 tablet (40 mg total) by mouth every evening. For cholesterol. 90 tablet 1   No current facility-administered medications on file prior to visit.    BP (!) 110/58   Pulse 94   Temp 98.2 F (36.8 C) (Temporal)   Ht 5\' 10"  (1.778 m)   Wt 160 lb (72.6 kg)   SpO2 95%   BMI 22.96 kg/m  Objective:   Physical Exam HENT:     Head:     Comments: Fluid noted to right TM. No erythema or bulging.  Cardiovascular:     Rate and Rhythm: Normal rate and regular rhythm.  Pulmonary:     Effort: Pulmonary effort is normal.     Breath sounds: Normal breath sounds. No wheezing or rales.  Musculoskeletal:     Cervical back: Neck supple.  Skin:    General: Skin is warm and dry.  Neurological:     Mental Status: He is alert and oriented to person, place, and time.          Assessment & Plan:      This visit occurred during the SARS-CoV-2 public health emergency.  Safety protocols were in place, including screening questions prior to the visit, additional usage of staff PPE, and extensive cleaning of exam room while observing appropriate contact time as indicated for disinfecting solutions.

## 2021-05-24 NOTE — Patient Instructions (Signed)
Stop citalopram.   Start duloxetine (Cymbalta) 20 mg daily for anxiety and depression.  Nasal Congestion/Ear Pressure/Sinus Pressure: Try using Flonase (fluticasone) nasal spray. Instill 1 spray in each nostril twice daily.

## 2021-05-24 NOTE — Assessment & Plan Note (Signed)
Chronic for 3 months. Exam today with mild fluid in right TM.  Rx for Flonase sent to pharmacy.  He will update.

## 2021-05-24 NOTE — Assessment & Plan Note (Signed)
Well controlled in the office today, continue increased dose of lisinopril 30 mg.

## 2021-06-08 DIAGNOSIS — E1065 Type 1 diabetes mellitus with hyperglycemia: Secondary | ICD-10-CM | POA: Diagnosis not present

## 2021-06-12 MED ORDER — DULOXETINE HCL 20 MG PO CPEP: 40.0000 mg | ORAL_CAPSULE | Freq: Every day | ORAL | 1 refills | Status: DC

## 2021-07-08 DIAGNOSIS — E1065 Type 1 diabetes mellitus with hyperglycemia: Secondary | ICD-10-CM | POA: Diagnosis not present

## 2021-07-23 ENCOUNTER — Other Ambulatory Visit: Payer: Self-pay

## 2021-07-23 ENCOUNTER — Ambulatory Visit (INDEPENDENT_AMBULATORY_CARE_PROVIDER_SITE_OTHER): Payer: BC Managed Care – PPO

## 2021-07-23 ENCOUNTER — Ambulatory Visit
Admission: EM | Admit: 2021-07-23 | Discharge: 2021-07-23 | Disposition: A | Discharge status: Home / Self Care | Payer: BC Managed Care – PPO | Attending: Emergency Medicine | Admitting: Emergency Medicine

## 2021-07-23 ENCOUNTER — Encounter: Payer: Self-pay | Admitting: Emergency Medicine

## 2021-07-23 DIAGNOSIS — R0602 Shortness of breath: Secondary | ICD-10-CM | POA: Diagnosis not present

## 2021-07-23 DIAGNOSIS — J069 Acute upper respiratory infection, unspecified: Secondary | ICD-10-CM

## 2021-07-23 MED ORDER — METHYLPREDNISOLONE SODIUM SUCC 125 MG IJ SOLR
60.0000 mg | Freq: Once | INTRAMUSCULAR | Status: AC
  Administered 2021-07-23: 60 mg via INTRAMUSCULAR

## 2021-07-23 MED ORDER — PROMETHAZINE-DM 6.25-15 MG/5ML PO SYRP: 5.0000 mL | ORAL_SOLUTION | Freq: Four times a day (QID) | ORAL | 0 refills | Status: DC | PRN

## 2021-07-23 MED ORDER — ALBUTEROL SULFATE HFA 108 (90 BASE) MCG/ACT IN AERS: 2.0000 | INHALATION_SPRAY | RESPIRATORY_TRACT | 0 refills | Status: DC | PRN

## 2021-07-23 MED ORDER — BENZONATATE 100 MG PO CAPS: 100.0000 mg | ORAL_CAPSULE | Freq: Three times a day (TID) | ORAL | 0 refills | Status: DC

## 2021-07-23 NOTE — ED Provider Notes (Signed)
[facility]    CSN: [id number] Arrival date & time: 07/23/21  0947      History   Chief Complaint Chief Complaint  Patient presents with   Nasal Congestion   Cough    HPI JONATH Deleon is a 65 y.o. male.   Patient presents nasal and chest congestion, non productive dry cough, headaches, chest soreness, chest tightness, ear popping, fatigue for 8 days. Initially had diarrhea but has resolved. No known sick contacts. Vaccinated. Tolerating food and liquids. History of type 1 DM, controlled.   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 Glenna Fellows   COLONOSCOPY WITH PROPOFOL N/A 05/12/2018   Procedure: COLONOSCOPY WITH PROPOFOL;  Surgeon: Lucilla Lame, MD;  Location: [facility] END[facility]COPY;  Service: Endoscopy;  Laterality: N/A;   FRACTURE SURGERY Right August 05, 1986   Elbow   TOTAL HIP ARTHROPLASTY Left 10/31/2015   Procedure: TOTAL HIP ARTHROPLASTY;  Surgeon: Christophe Louis, MD;  Location: [facility] ORS;  Service: Orthopedics;  Laterality: Left;       Home  Medications    Prior to Admission medications   Medication Sig Start Date End Date Taking? Authorizing Provider  DULoxetine (CYMBALTA) 20 MG capsule Take 2 capsules (40 mg total) by mouth daily. For anxiety and depression. 06/12/21   Pleas Koch, NP  fluticasone (FLONASE) 50 MCG/ACT nasal spray Place 1 spray into both nostrils 2 (two) times daily. 05/24/21   Pleas Koch, NP  gabapentin (NEURONTIN) 800 MG tablet TAKE 1 TABLET BY MOUTH THREE TIMES DAILY FOR  NEUROPATHIC  PAIN 05/16/20   Pleas Koch, NP  Insulin Human (INSULIN PUMP) SOLN Inject into the skin every hour. Humalog Insulin Pump--patient stated he has five different rates depending on the time of day    [provider]  insulin lispro (HUMALOG) 100 UNIT/ML injection SMARTSIG:0-80 Unit(s) SUB-Q Daily 03/24/20   [provider]  lisinopril (ZESTRIL) 30 MG tablet Take 1 tablet (30 mg total) by mouth daily. For blood pressure. 05/03/21   Pleas Koch, NP  methocarbamol (ROBAXIN) 500 MG tablet Take 1 tablet by mouth 3 (three) times daily. 02/27/21   [provider]  promethazine (PHENERGAN) 12.5 MG tablet TAKE 1 TO 2 TABLETS BY MOUTH EVERY 8 HOURS AS NEEDED FOR NAUSEA AND VOMITING 11/27/20   Pleas Koch, NP  simvastatin (ZOCOR) 40 MG tablet Take 1 tablet (40 mg total) by mouth every evening. For cholesterol. 08/24/20  Pleas Koch, NP    Family History Family History  Problem Relation Age of Onset   Heart disease Father    Heart attack Father 20   Penile cancer Father    Arrhythmia Brother     Social History Social History   Tobacco Use   Smoking status: Former    Packs/day: 1.00    Types: Cigarettes    Quit date: 03/16/1997    Years since quitting: 24.3   Smokeless tobacco: Never  Substance Use Topics   Alcohol use: No   Drug use: Yes    Types: Marijuana     Allergies   Codeine   Review of Systems Review of Systems  Constitutional: Negative.   HENT:  Positive  for congestion. Negative for dental problem, drooling, ear discharge, ear pain, facial swelling, hearing loss, mouth sores, nosebleeds, postnasal drip, rhinorrhea, sinus pressure, sinus pain, sneezing, sore throat, tinnitus, trouble swallowing and voice change.   Respiratory:  Positive for cough, chest tightness and shortness of breath. Negative for apnea, choking, wheezing and stridor.   Cardiovascular:  Negative for chest pain, palpitations and leg swelling.  Skin: Negative.   Neurological: Negative.     Physical Exam Triage Vital Signs ED Triage Vitals  Enc Vitals Group     BP 07/23/21 1102 (!) 146/85     Pulse Rate 07/23/21 1102 81     Resp 07/23/21 1102 18     Temp 07/23/21 1102 98.1 F (36.7 C)     Temp Source 07/23/21 1102 Oral     SpO2 07/23/21 1102 96 %     Weight --      Height --      Head Circumference --      Peak Flow --      Pain Score 07/23/21 1103 5     Pain Loc --      Pain Edu? --      Excl. in [place]? --    No data found.  Updated Vital Signs BP (!) 146/85 (BP Location: Left Arm)   Pulse 81   Temp 98.1 F (36.7 C) (Oral)   Resp 18   SpO2 96%   Visual Acuity Right Eye Distance:   Left Eye Distance:   Bilateral Distance:    Right Eye Near:   Left Eye Near:    Bilateral Near:     Physical Exam Constitutional:      Appearance: Normal appearance. He is normal weight.  HENT:     Head: Normocephalic.     Right Ear: Hearing, ear canal and external ear normal. A middle ear effusion is present.     Left Ear: Hearing, ear canal and external ear normal. A middle ear effusion is present.     Nose: Congestion present. No rhinorrhea.     Mouth/Throat:     Mouth: Mucous membranes are moist.  Eyes:     Extraocular Movements: Extraocular movements intact.  Cardiovascular:     Rate and Rhythm: Normal rate and regular rhythm.     Pulses: Normal pulses.     Heart sounds: Normal heart sounds.  Pulmonary:     Effort: Pulmonary effort is normal.     Breath  sounds: Normal breath sounds.  Musculoskeletal:     Cervical back: Normal range of motion and neck supple.  Skin:    General: Skin is warm and dry.  Neurological:     General: No focal deficit present.     Mental Status: He is alert  and oriented to person, place, and time. Mental status is at baseline.  Psychiatric:        Behavior: Behavior normal.     UC Treatments / Results  Labs (all labs ordered are listed, but only abnormal results are displayed) Labs Reviewed - No data to display  EKG   Radiology No results found.  Procedures Procedures (including critical care time)  Medications Ordered in UC Medications - No data to display  Initial Impression / Assessment and Plan / UC Course  I have reviewed the triage vital signs and the nursing notes.  Pertinent labs & imaging results that were available during my care of the patient were reviewed by me and considered in my medical decision making (see chart for details).  URI with cough  Chest x-ray negative   Methylprednisolone 60 mg IM now, discussed effect on blood sugar, has tolerated steroid before, has implanted pump, verbalized understanding Tessalon 100 mg tid prn Promethazine 6.25/15 mg/ 32m every 4 hours prn Albuterol inhaler 937m 2 puffs every 4 hours prn  Final Clinical Impressions(s) / UC Diagnoses   Final diagnoses:  None   Discharge Instructions   None    ED Prescriptions   None    PDMP not reviewed this encounter.   WhHans EdenNP 07/23/21 1234

## 2021-07-23 NOTE — ED Triage Notes (Signed)
Pt sts nasal congestion, cough, pain with cough and HA x 8 days; pt denies fever and sts neg covid test x 2

## 2021-07-23 NOTE — Discharge Instructions (Addendum)
Your chest x-ray was negative for signs of infection, fluid or structural changes   Can use tessalon every 8 hours for cough  Can use 5 mL of cough syrup every 4 hours for coughing   Can use albuterol inhaler 2 puffs every 4 hours for shortness of breath and chest tightness   Follow up with primary care in 2 weeks in symptoms persist

## 2021-07-23 NOTE — Telephone Encounter (Signed)
Noted, agree with in-person evaluation

## 2021-07-23 NOTE — Telephone Encounter (Signed)
Spoke to patient by telephone and was advised that he has had a lot of congestion for about a week. Patient stated when he went to the UC he asked them for a covid test. Patient stated that he is having difficulty breathing and feels like he can not get enough oxygen into his lungs. Patient stated that he is wheezing and has a rattle in his chest. Patient was given information on the [facility]. Patient stated that he will head over to the UC shortly. Patient was given information on [facility] because they have x-ray equipment if he needs to have an x-ray done.
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
## 2021-08-01 ENCOUNTER — Other Ambulatory Visit: Payer: Self-pay

## 2021-08-01 ENCOUNTER — Ambulatory Visit (INDEPENDENT_AMBULATORY_CARE_PROVIDER_SITE_OTHER): Payer: BC Managed Care – PPO | Admitting: Internal Medicine

## 2021-08-01 ENCOUNTER — Encounter: Payer: Self-pay | Admitting: Internal Medicine

## 2021-08-01 DIAGNOSIS — T22211A Burn of second degree of right forearm, initial encounter: Secondary | ICD-10-CM | POA: Diagnosis not present

## 2021-08-01 DIAGNOSIS — T2200XA Burn of unspecified degree of shoulder and upper limb, except wrist and hand, unspecified site, initial encounter: Secondary | ICD-10-CM | POA: Insufficient documentation

## 2021-08-01 HISTORY — DX: Burn of unspecified degree of shoulder and upper limb, except wrist and hand, unspecified site, initial encounter: T22.00XA

## 2021-08-01 MED ORDER — HYDROMORPHONE HCL 2 MG PO TABS: 2.0000 mg | ORAL_TABLET | ORAL | 0 refills | Status: DC | PRN

## 2021-08-01 NOTE — Telephone Encounter (Signed)
Okay I will check it out this morning
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
## 2021-08-01 NOTE — Assessment & Plan Note (Signed)
He has done a good job with preventing infection Discussed gentle cleansing to clean off the blistered skin No infection Will give some dilaudid for the pain at night (other meds cause nausea)

## 2021-08-07 DIAGNOSIS — E1065 Type 1 diabetes mellitus with hyperglycemia: Secondary | ICD-10-CM | POA: Diagnosis not present

## 2021-08-07 NOTE — Telephone Encounter (Signed)
You need to see patient right.

## 2021-08-08 NOTE — Telephone Encounter (Signed)
Rich, what do you think? Does this look better?

## 2021-08-15 DIAGNOSIS — Z6822 Body mass index (BMI) 22.0-22.9, adult: Secondary | ICD-10-CM | POA: Diagnosis not present

## 2021-08-15 DIAGNOSIS — E782 Mixed hyperlipidemia: Secondary | ICD-10-CM | POA: Diagnosis not present

## 2021-08-15 DIAGNOSIS — E1065 Type 1 diabetes mellitus with hyperglycemia: Secondary | ICD-10-CM | POA: Diagnosis not present

## 2021-08-15 DIAGNOSIS — I10 Essential (primary) hypertension: Secondary | ICD-10-CM | POA: Diagnosis not present

## 2021-09-06 DIAGNOSIS — E1065 Type 1 diabetes mellitus with hyperglycemia: Secondary | ICD-10-CM | POA: Diagnosis not present

## 2021-10-06 DIAGNOSIS — E1065 Type 1 diabetes mellitus with hyperglycemia: Secondary | ICD-10-CM | POA: Diagnosis not present

## 2021-10-26 ENCOUNTER — Encounter: Payer: Self-pay | Admitting: Primary Care

## 2021-11-09 DIAGNOSIS — E1065 Type 1 diabetes mellitus with hyperglycemia: Secondary | ICD-10-CM | POA: Diagnosis not present

## 2021-11-13 IMAGING — CT CT HEAD W/O CM
3 series · 16 of 47 positions shown, 19 images · non-contrast
Comparison: February 18, 2012.

CLINICAL DATA: Posttraumatic headache after fall.

EXAM:
CT HEAD WITHOUT CONTRAST
TECHNIQUE: Contiguous axial images were obtained from the base of the skull
through the vertex without intravenous contrast.

[Series 3: head wo · axial · 0.47mm/px · z∈[-132,-7]mm · 10 of 31 slices shown, 13 images]
[im 3/31  brain]
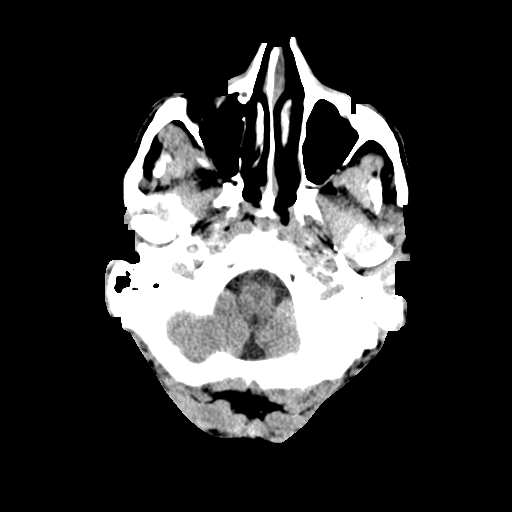
[im 3/31  bone]
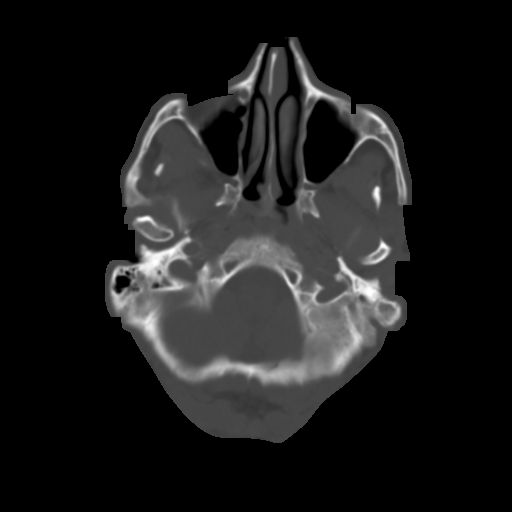
[im 6/31  brain]
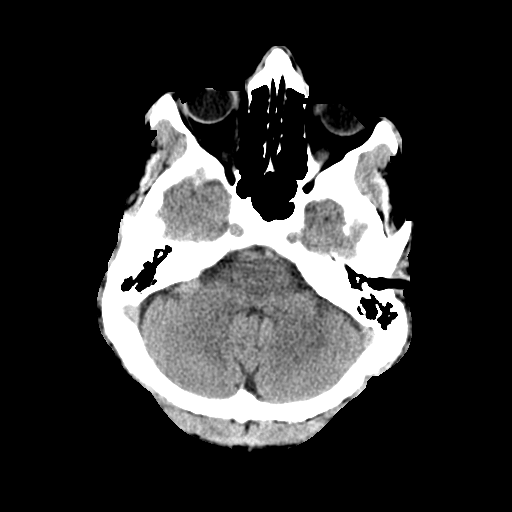
[im 9/31  brain]
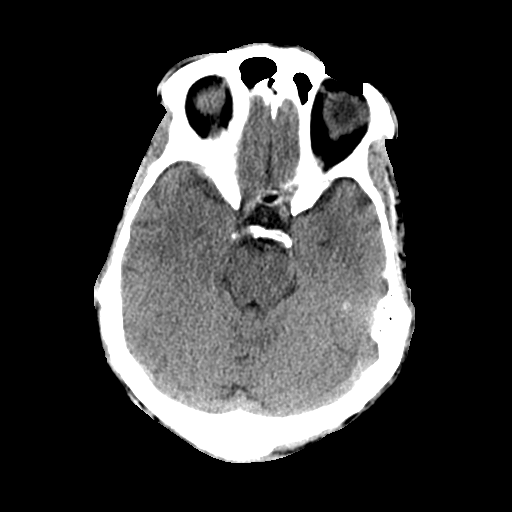
[im 11/31  brain]
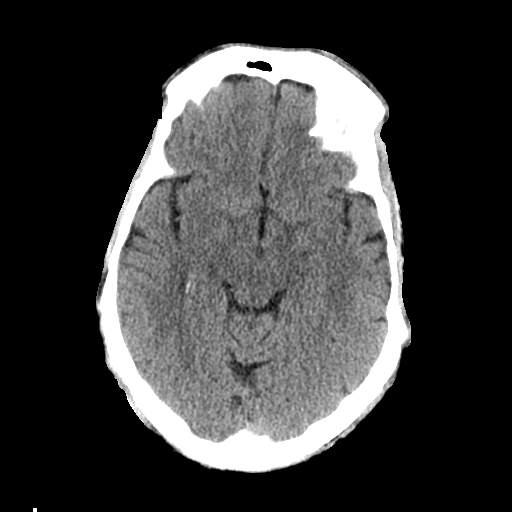
[im 14/31  brain]
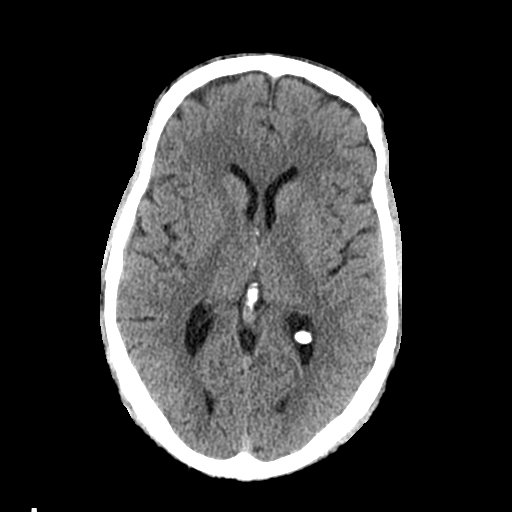
[im 14/31  bone]
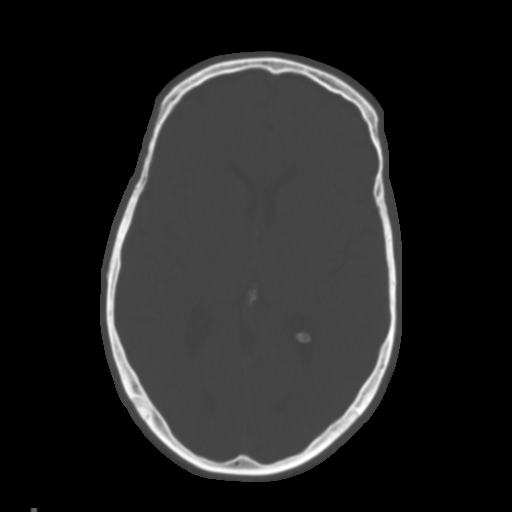
[im 17/31  brain]
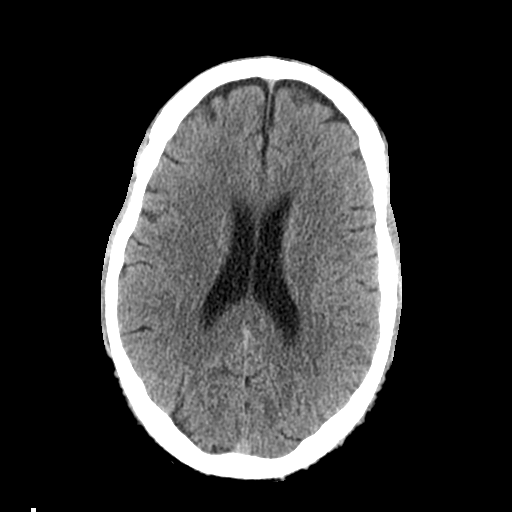
[im 20/31  brain]
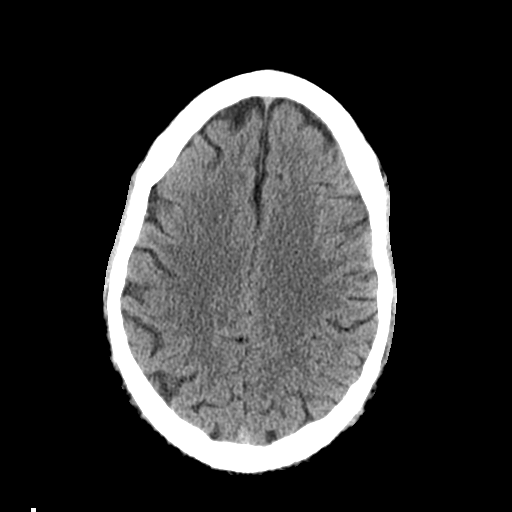
[im 23/31  brain]
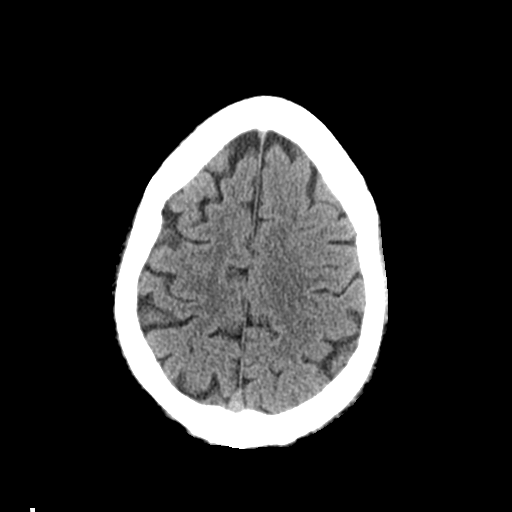
[im 25/31  brain]
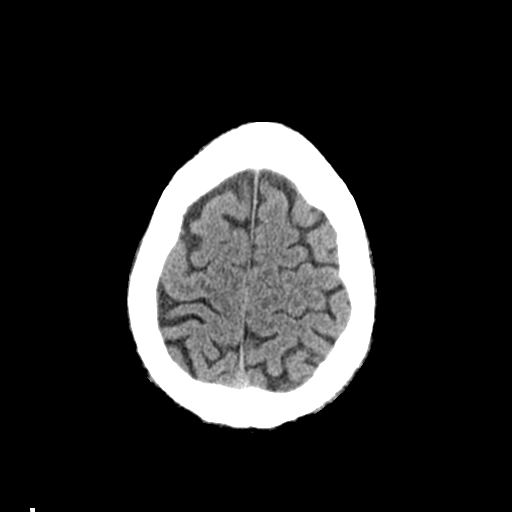
[im 25/31  bone]
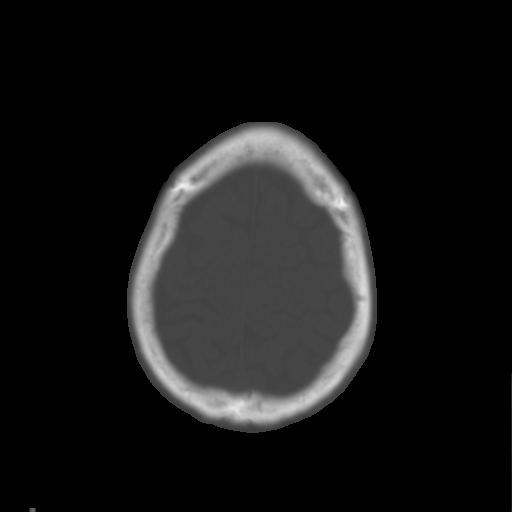
[im 28/31  brain]
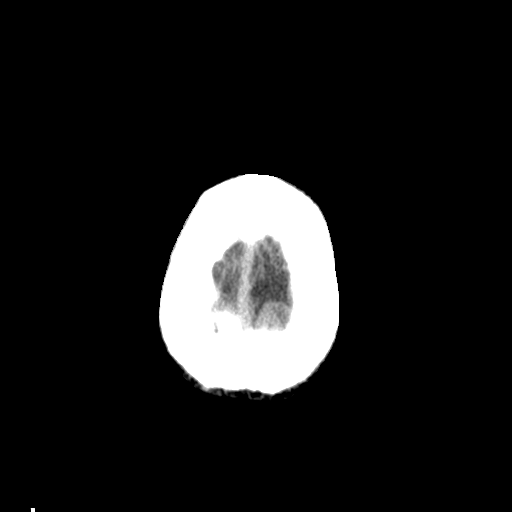

[Series 4: coronal soft tissue · coronal · 0.34mm/px · 3 of 78 slices shown]
[im 26/78  brain]
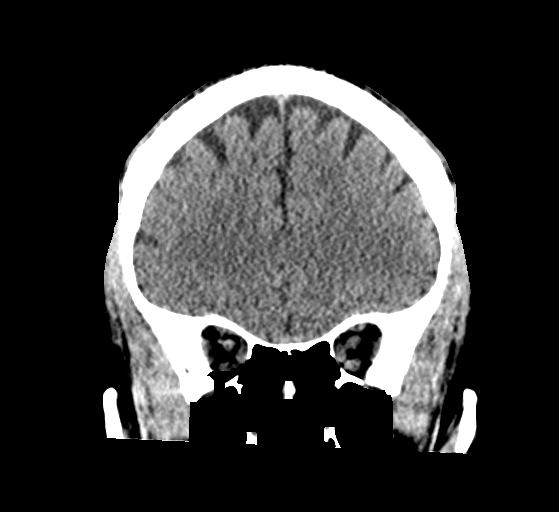
[im 35/78  brain]
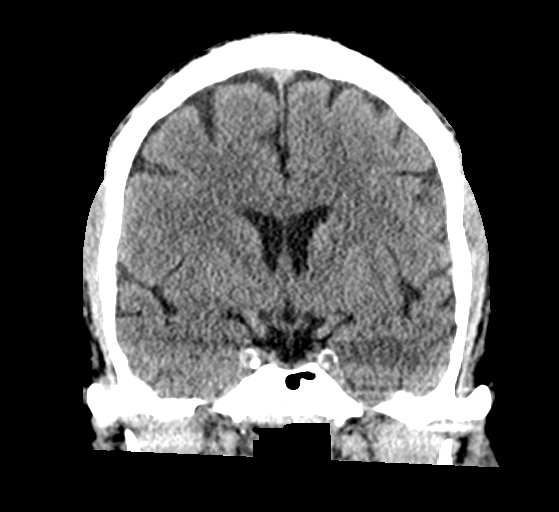
[im 43/78  brain]
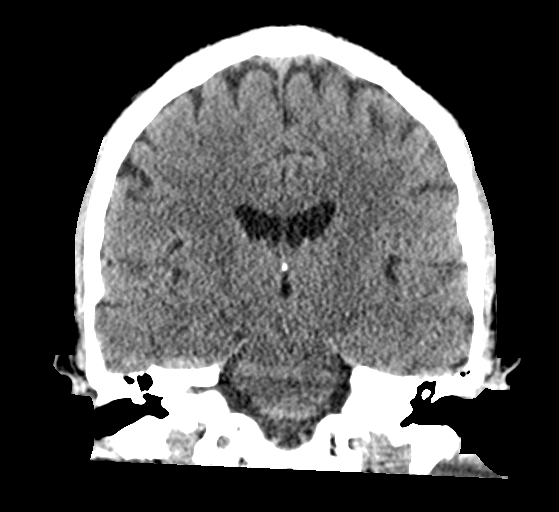

[Series 5: sagittal soft tissue · sagittal · 0.36mm/px · 3 of 56 slices shown]
[im 19/56  brain]
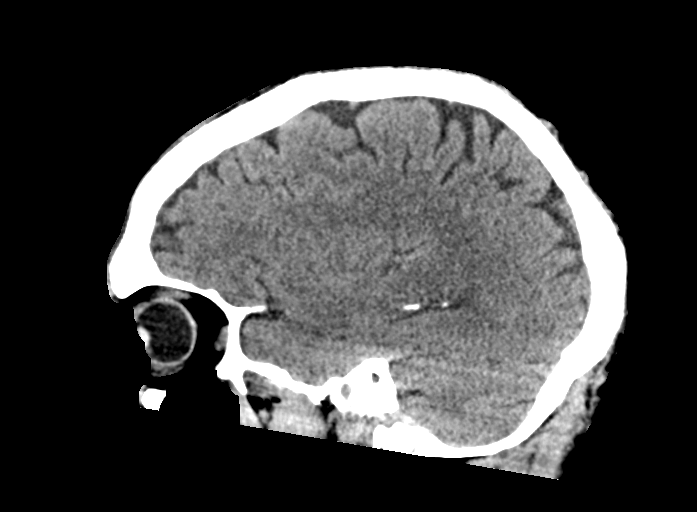
[im 28/56  brain]
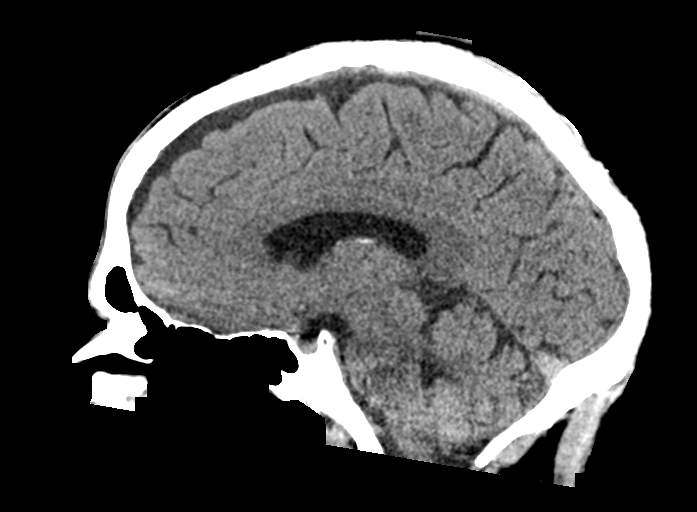
[im 37/56  brain]
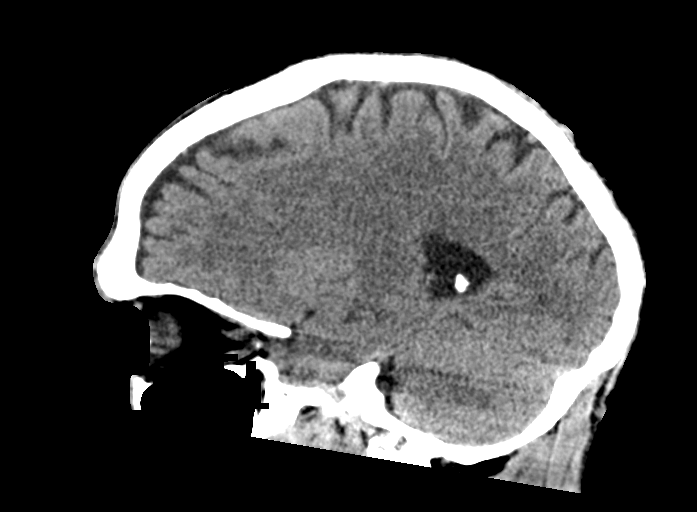

[16 of 47 positions shown; findings below may reference images not displayed]

FINDINGS: Brain: No evidence of acute infarction, hemorrhage, hydrocephalus,
extra-axial collection or mass lesion/mass effect.

Vascular: No hyperdense vessel or unexpected calcification.

Skull: Normal. Negative for fracture or focal lesion.

Sinuses/Orbits: No acute finding.

Other: None.
IMPRESSION: Normal head CT.

## 2021-11-13 IMAGING — MR MR THORACIC SPINE W/O CM
5 of 6 series · 31 of 48 positions shown · non-contrast
Comparison: CT 08/23/2014

CLINICAL DATA: Acute mid back pain

EXAM:
MRI THORACIC SPINE WITHOUT CONTRAST
TECHNIQUE: Multiplanar, multisequence MR imaging of the thoracic spine was
performed. No intravenous contrast was administered.

[Series 16: T1 · sagittal · 5.0mm · 1.41mm/px · 4 of 9 slices shown (1 of 2)]
[im 1/9]
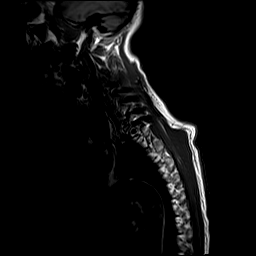
[im 3/9]
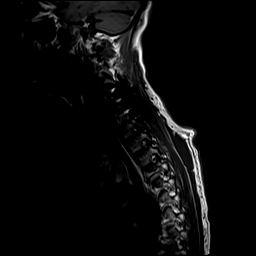
[im 6/9]
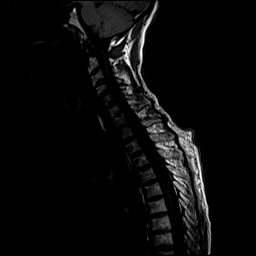
[im 9/9]
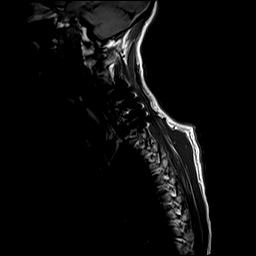

[Series 17: T2 · sagittal · 3.0mm · 1.06mm/px · 6 of 18 slices shown (1 of 2)]
[im 1/18]
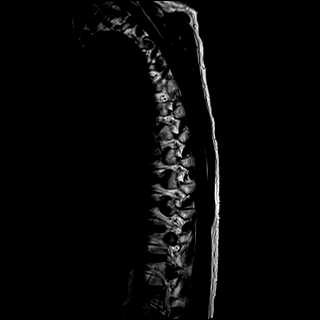
[im 4/18]
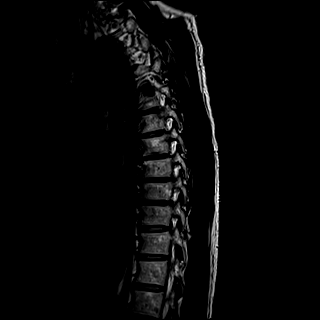
[im 7/18]
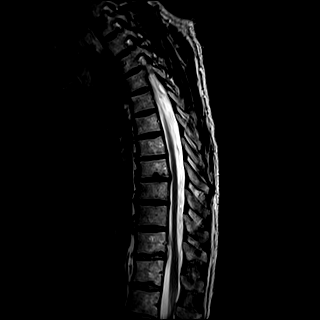
[im 11/18]
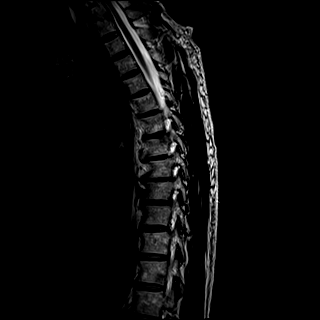
[im 14/18]
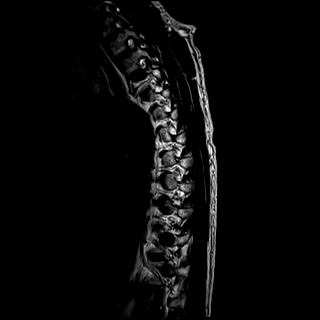
[im 18/18]
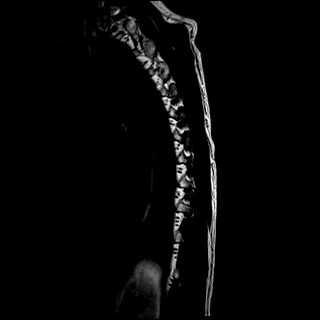

[Series 18: T1 · sagittal · 3.0mm · 1.06mm/px · 6 of 18 slices shown (2 of 2)]
[im 1/18]
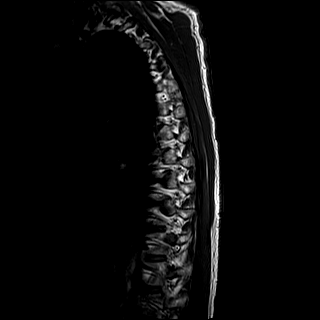
[im 4/18]
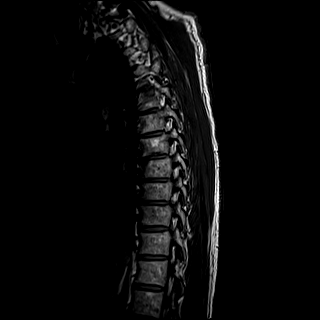
[im 7/18]
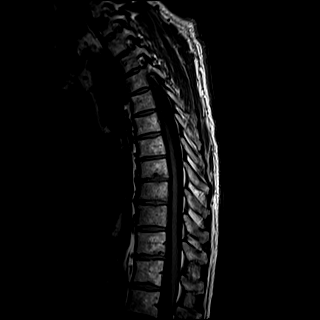
[im 11/18]
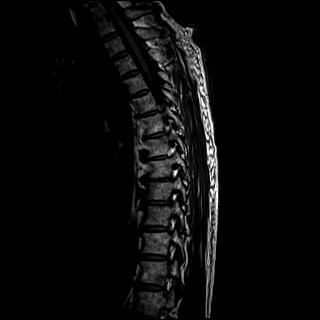
[im 14/18]
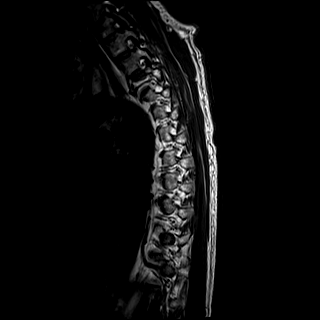
[im 18/18]
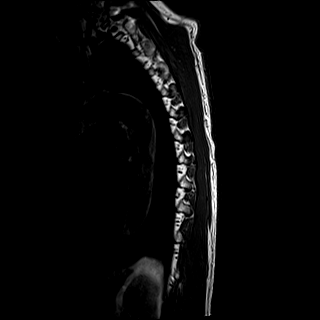

[Series 19: STIR · sagittal · 3.0mm · 0.53mm/px · 6 of 18 slices shown]
[im 1/18]
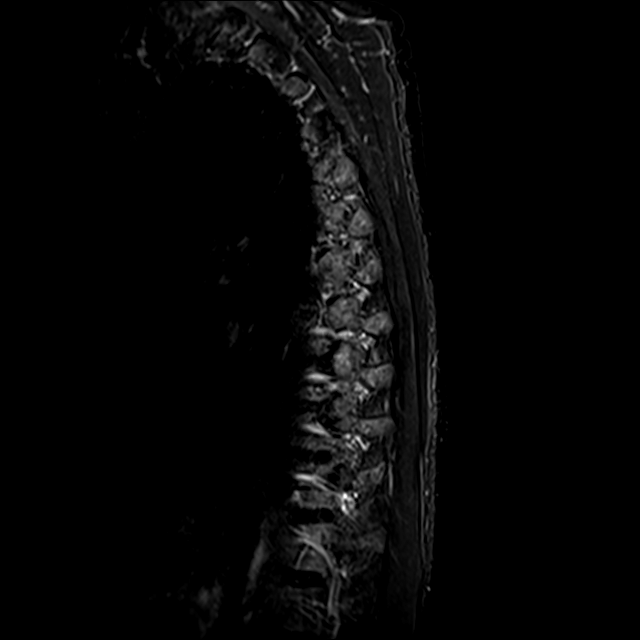
[im 4/18]
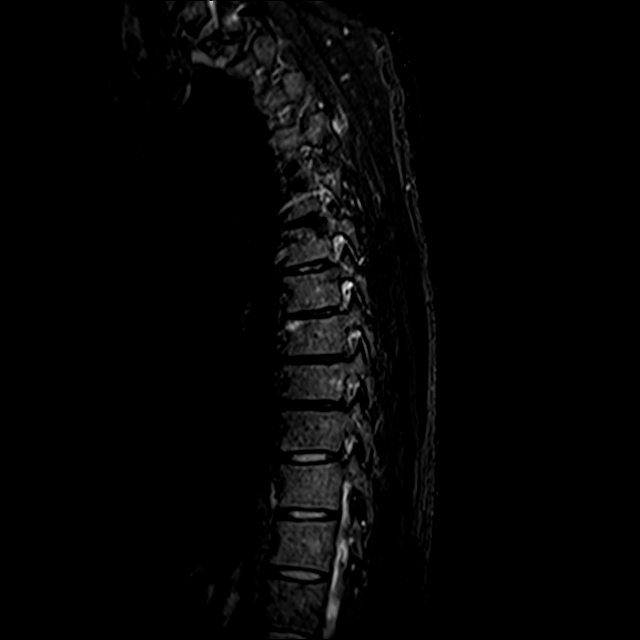
[im 7/18]
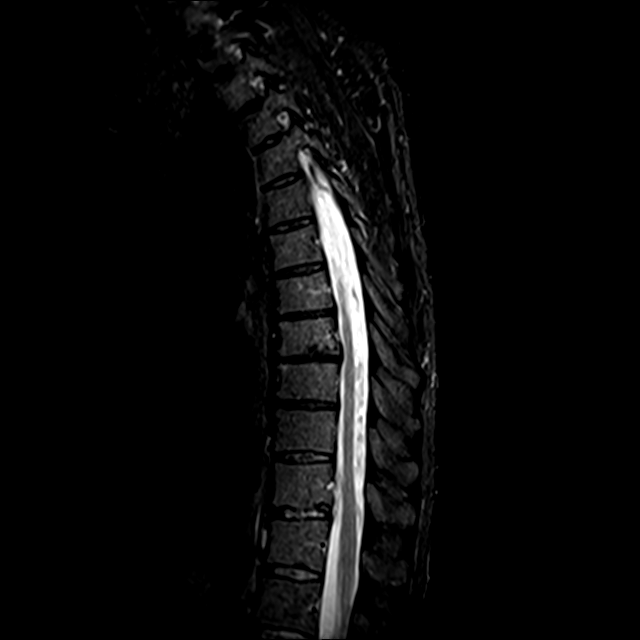
[im 11/18]
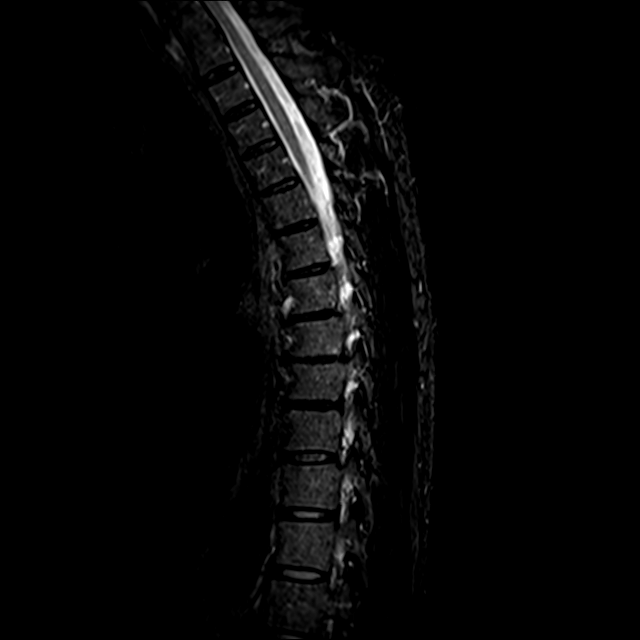
[im 14/18]
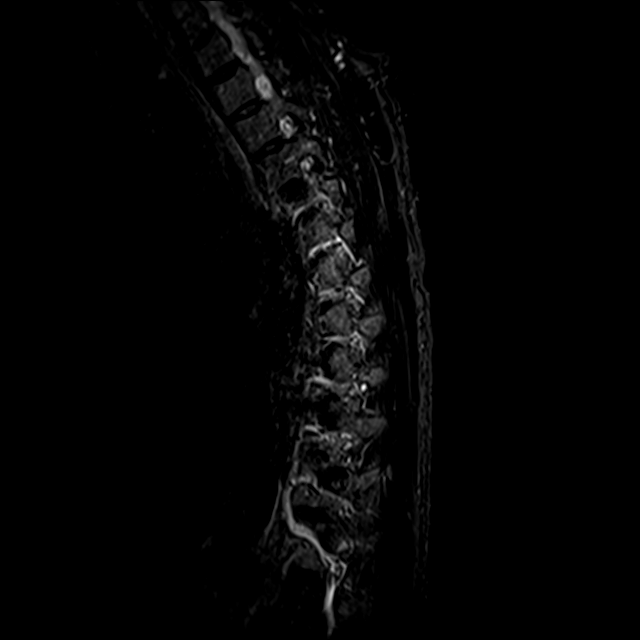
[im 18/18]
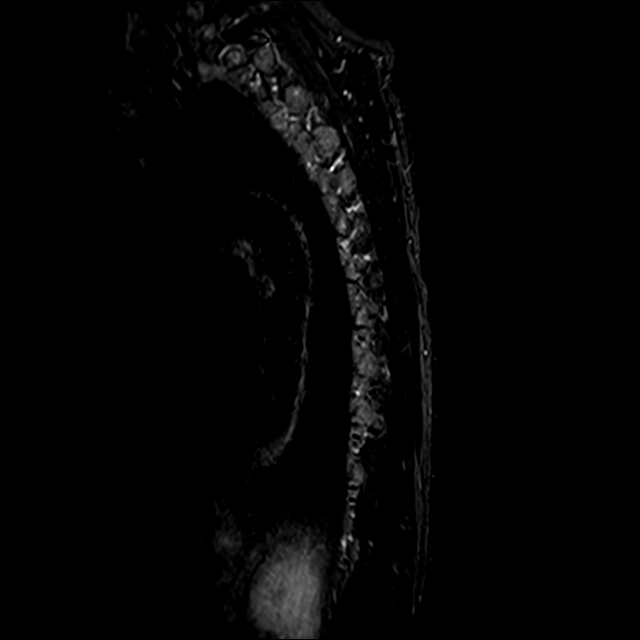

[Series 20: T2 · axial · 4.0mm · 0.59mm/px · z∈[-285,-38]mm · 9 of 39 slices shown (2 of 2)]
[im 1/39]
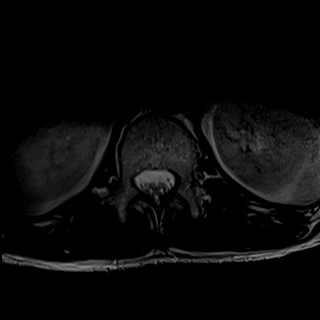
[im 7/39]
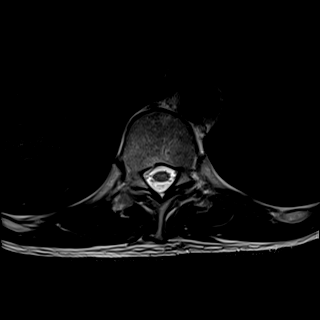
[im 13/39]
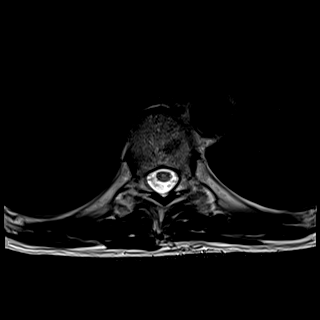
[im 16/39]
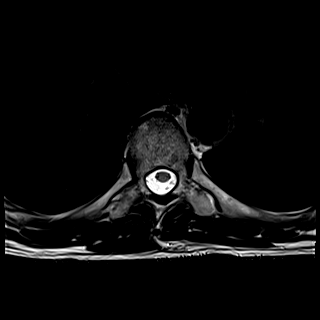
[im 20/39]
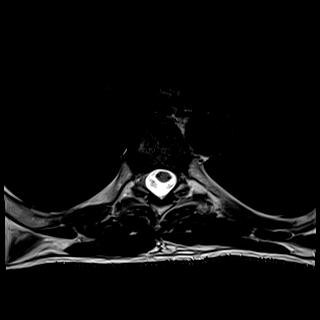
[im 23/39]
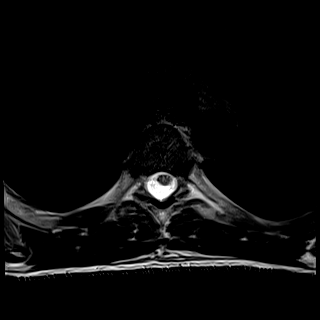
[im 26/39]
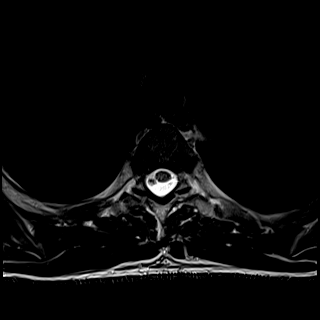
[im 32/39]
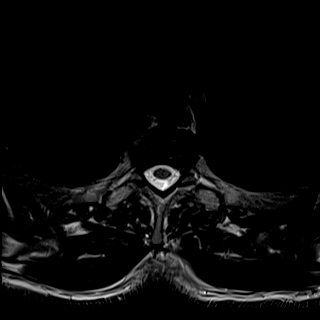
[im 39/39]
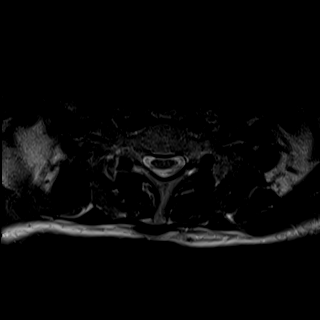

[31 of 48 positions shown; findings below may reference images not displayed]

FINDINGS: Alignment:  Physiologic.

Vertebrae: No fracture, evidence of discitis, or bone lesion.

Cord:  Normal signal and morphology.

Paraspinal and other soft tissues: 1.0 cm T2 hyperintense focus
within the posterior right hepatic lobe (series 20, image 34),
incompletely characterized [DATE] reflect a cyst.

Disc levels:

T1-T2: No significant canal or neural foraminal narrowing.

T2-T3: No significant canal or neural foraminal narrowing.

T3-T4: No significant canal or neural foraminal narrowing.

T4-T5: No significant canal or neural foraminal narrowing.

T5-T6: No significant canal or neural foraminal narrowing.

T6-T7: No significant canal or neural foraminal narrowing.

T7-T8: No significant canal or neural foraminal narrowing.

T8-T9: Minimal disc bulge without foraminal or canal stenosis.

T9-T10: Minimal disc bulge, slightly eccentric to the left without
foraminal or canal stenosis.

T10-T11: No significant canal or neural foraminal narrowing.

T11-T12: Right paracentral disc extrusion with cranial migration of
T1 slightly hyperintense, T2 heterogeneously hyperintense disc
material 2.1 cm extending just below the level of the T11 superior
endplate (series 18, image 6; series 17, image 6-7). No associated
canal or foraminal stenosis.

T12-L1: No significant canal or neural foraminal narrowing.
IMPRESSION: 1. Large right paracentral disc extrusion at T11-12 with cranial
migration of disc material to just below the level of the T11
superior endplate. No associated foraminal or canal stenosis.
2. Minimal mid to lower thoracic spondylosis without foraminal or
canal stenosis of any level.
3. No acute osseous abnormality.  No cord signal abnormality.

## 2021-11-14 DIAGNOSIS — Z6823 Body mass index (BMI) 23.0-23.9, adult: Secondary | ICD-10-CM | POA: Diagnosis not present

## 2021-11-14 DIAGNOSIS — E782 Mixed hyperlipidemia: Secondary | ICD-10-CM | POA: Diagnosis not present

## 2021-11-14 DIAGNOSIS — E1065 Type 1 diabetes mellitus with hyperglycemia: Secondary | ICD-10-CM | POA: Diagnosis not present

## 2021-11-14 DIAGNOSIS — I10 Essential (primary) hypertension: Secondary | ICD-10-CM | POA: Diagnosis not present

## 2021-11-14 LAB — HEMOGLOBIN A1C: Hemoglobin A1C: 7.7

## 2021-12-03 ENCOUNTER — Other Ambulatory Visit: Payer: Self-pay | Admitting: Primary Care

## 2021-12-03 DIAGNOSIS — F419 Anxiety disorder, unspecified: Secondary | ICD-10-CM

## 2021-12-09 DIAGNOSIS — E1065 Type 1 diabetes mellitus with hyperglycemia: Secondary | ICD-10-CM | POA: Diagnosis not present

## 2021-12-11 ENCOUNTER — Telehealth (INDEPENDENT_AMBULATORY_CARE_PROVIDER_SITE_OTHER): Payer: BC Managed Care – PPO | Admitting: Nurse Practitioner

## 2021-12-11 ENCOUNTER — Encounter: Payer: Self-pay | Admitting: Nurse Practitioner

## 2021-12-11 VITALS — Temp 99.8°F

## 2021-12-11 DIAGNOSIS — R112 Nausea with vomiting, unspecified: Secondary | ICD-10-CM | POA: Diagnosis not present

## 2021-12-11 DIAGNOSIS — Z8616 Personal history of COVID-19: Secondary | ICD-10-CM | POA: Insufficient documentation

## 2021-12-11 DIAGNOSIS — U071 COVID-19: Secondary | ICD-10-CM | POA: Diagnosis not present

## 2021-12-11 MED ORDER — NIRMATRELVIR/RITONAVIR (PAXLOVID)TABLET: 3.0000 | ORAL_TABLET | Freq: Two times a day (BID) | ORAL | 0 refills | Status: AC

## 2021-12-11 MED ORDER — PROMETHAZINE HCL 12.5 MG PO TABS: ORAL_TABLET | ORAL | 0 refills | Status: DC

## 2021-12-11 NOTE — Patient Instructions (Signed)
Nice to see you Do not take the statin or the nose spray while on the Paxolvid Can start back on the statin 5 days after you finish the paxlovid

## 2021-12-11 NOTE — Telephone Encounter (Signed)
Called patient put on with [facility] today for my chart.

## 2021-12-11 NOTE — Progress Notes (Signed)
Patient ID: Martin Deleon, male    DOB: 11-23-1956, 65 y.o.   MRN: [id number]  Virtual visit completed through [employer], a video enabled telemedicine application. Due to national recommendations of social distancing due to COVID-19, a virtual visit is felt to be most appropriate for this patient at this time. Reviewed limitations, risks, security and privacy concerns of performing a virtual visit and the availability of in person appointments. I also reviewed that there may be a patient responsible charge related to this service. The patient agreed to proceed.   Patient location: home Provider location: [place] [facility], office Persons participating in this virtual visit: patient, provider   If any vitals were documented, they were collected by patient at home unless specified below.    Temp 99.8 F (37.7 C) Comment: per patient this morning   CC: Covid 19 Subjective:   HPI: Martin Deleon is a 65 y.o. male presenting on 12/11/2021 for Covid Positive (On 12/11/21, sx started on 12/09/21-chills, upset stomach, headache, not eating well-weak from that, diarrhea, nausea, gets SOB. )  Symptoms started on 12/09/2021 Covid test this morning at home that was positive Pfizer x2 and 2 booster Wife and son that were positive last week No OTC medications   Relevant past medical, surgical, family and social history reviewed and updated as indicated. Interim medical history since our last visit reviewed. Allergies and medications reviewed and updated. Outpatient Medications Prior to Visit  Medication Sig Dispense Refill   DULoxetine (CYMBALTA) 20 MG capsule TAKE 2 CAPSULES BY MOUTH ONCE DAILY FOR ANXIETY AND FOR DEPRESSION 180 capsule 0   gabapentin (NEURONTIN) 800 MG tablet TAKE 1 TABLET BY MOUTH THREE TIMES DAILY FOR  NEUROPATHIC  PAIN 90 tablet 0   HYDROmorphone (DILAUDID) 2 MG tablet Take 1 tablet (2 mg total) by mouth every 4 (four) hours as needed for severe pain. 20 tablet 0    Insulin Human (INSULIN PUMP) SOLN Inject into the skin every hour. Humalog Insulin Pump--patient stated he has five different rates depending on the time of day     insulin lispro (HUMALOG) 100 UNIT/ML injection SMARTSIG:0-80 Unit(s) SUB-Q Daily     lisinopril (ZESTRIL) 30 MG tablet Take 1 tablet (30 mg total) by mouth daily. For blood pressure. 90 tablet 3   methocarbamol (ROBAXIN) 500 MG tablet Take 1 tablet by mouth 3 (three) times daily.     promethazine (PHENERGAN) 12.5 MG tablet TAKE 1 TO 2 TABLETS BY MOUTH EVERY 8 HOURS AS NEEDED FOR NAUSEA AND VOMITING 20 tablet 0   simvastatin (ZOCOR) 40 MG tablet Take 1 tablet (40 mg total) by mouth every evening. For cholesterol. 90 tablet 1   albuterol (VENTOLIN HFA) 108 (90 Base) MCG/ACT inhaler Inhale 2 puffs into the lungs every 4 (four) hours as needed for wheezing or shortness of breath. (Patient not taking: Reported on 12/11/2021) 18 g 0   fluticasone (FLONASE) 50 MCG/ACT nasal spray Place 1 spray into both nostrils 2 (two) times daily. (Patient not taking: Reported on 12/11/2021) 16 g 0   benzonatate (TESSALON) 100 MG capsule Take 1 capsule (100 mg total) by mouth every 8 (eight) hours. 21 capsule 0   No facility-administered medications prior to visit.     Per HPI unless specifically indicated in ROS section below Review of Systems  Constitutional:  Positive for appetite change, chills, fatigue and fever.  HENT:  Positive for sinus pressure and sinus pain. Negative for congestion (stinging in ears), ear discharge, ear  pain and sore throat.   Respiratory:  Positive for shortness of breath (DOE). Negative for cough.   Cardiovascular:  Negative for chest pain.  Gastrointestinal:  Positive for abdominal pain, diarrhea, nausea and vomiting.  Musculoskeletal:  Positive for arthralgias and myalgias.  Neurological:  Positive for dizziness, weakness and headaches.  Objective:  Temp 99.8 F (37.7 C) Comment: per patient this morning  Wt Readings  from Last 3 Encounters:  08/01/21 163 lb (73.9 kg)  05/24/21 160 lb (72.6 kg)  05/03/21 163 lb (73.9 kg)       Physical exam: Gen: alert, NAD, not ill appearing Pulm: speaks in complete sentences without increased work of breathing Psych: normal mood, normal thought content      Results for orders placed or performed in visit on 05/03/21  Hemoglobin A1c  Result Value Ref Range   Hemoglobin A1C 8.2   PSA  Result Value Ref Range   PSA 2.40 0.10 - 4.00 ng/mL  Comprehensive metabolic panel  Result Value Ref Range   Sodium 137 135 - 145 mEq/L   Potassium 4.6 3.5 - 5.1 mEq/L   Chloride 102 96 - 112 mEq/L   CO2 30 19 - 32 mEq/L   Glucose, Bld 155 (H) 70 - 99 mg/dL   BUN 8 6 - 23 mg/dL   Creatinine, Ser 0.84 0.40 - 1.50 mg/dL   Total Bilirubin 0.6 0.2 - 1.2 mg/dL   Alkaline Phosphatase 74 39 - 117 U/L   AST 18 0 - 37 U/L   ALT 16 0 - 53 U/L   Total Protein 6.4 6.0 - 8.3 g/dL   Albumin 4.3 3.5 - 5.2 g/dL   GFR 92.74 >60.00 mL/min   Calcium 9.4 8.4 - 10.5 mg/dL  Lipid panel  Result Value Ref Range   Cholesterol 141 0 - 200 mg/dL   Triglycerides 36.0 0.0 - 149.0 mg/dL   HDL 59.00 >39.00 mg/dL   VLDL 7.2 0.0 - 40.0 mg/dL   LDL Cholesterol 75 0 - 99 mg/dL   Total CHOL/HDL Ratio 2    NonHDL 82.13    Assessment & Plan:   Problem List Items Addressed This Visit       Digestive   Nausea and vomiting - Primary    Patient has been experiencing nausea and vomiting with illness.  Does have prescription for right promethazine that he recently ran out of.  We will review prescription did give urgent care and emergency department guidelines in regards to keeping fluid down.  Patient is also type I diabetic has CGM encourage close monitoring of his glucose      Relevant Medications   promethazine (PHENERGAN) 12.5 MG tablet     Other   COVID-19    COVID-19 positive.  Did discuss antiviral treatments and they were EUA only.  After joint discussion decided to pursue antiviral  treatment.  Did discuss common side effects of medication use.  Also discussed when to seek urgent or emergent health care.  Discussed CDC guidelines in regards to quarantine. Start paxlovid as soon as possible.      Relevant Medications   nirmatrelvir/ritonavir EUA (PAXLOVID) 20 x 150 MG & 10 x 100MG  TABS     No orders of the defined types were placed in this encounter.  No orders of the defined types were placed in this encounter.   I discussed the assessment and treatment plan with the patient. The patient was provided an opportunity to ask questions and all were answered. The patient  agreed with the plan and demonstrated an understanding of the instructions. The patient was advised to call back or seek an in-person evaluation if the symptoms worsen or if the condition fails to improve as anticipated.  Follow up plan: No follow-ups on file.  Romilda Garret, NP

## 2021-12-11 NOTE — Assessment & Plan Note (Signed)
COVID-19 positive.  Did discuss antiviral treatments and they were EUA only.  After joint discussion decided to pursue antiviral treatment.  Did discuss common side effects of medication use.  Also discussed when to seek urgent or emergent health care.  Discussed CDC guidelines in regards to quarantine. Start paxlovid as soon as possible.

## 2021-12-11 NOTE — Assessment & Plan Note (Signed)
Patient has been experiencing nausea and vomiting with illness.  Does have prescription for right promethazine that he recently ran out of.  We will review prescription did give urgent care and emergency department guidelines in regards to keeping fluid down.  Patient is also type I diabetic has CGM encourage close monitoring of his glucose

## 2021-12-12 ENCOUNTER — Telehealth: Payer: Self-pay | Admitting: Nurse Practitioner

## 2021-12-12 NOTE — Telephone Encounter (Signed)
Patient advised and verbalized undestanding

## 2021-12-12 NOTE — Telephone Encounter (Signed)
I saw Martin Deleon yesterday on video visit. I told him to hold his statin (simvastatin) while taking the antiviral Paxlovid. He also needs to hold it for an additional 5 days after he completes the antiviral treatment. I placed that on the medication bottle and his AVS but did not discuss it with him. Can we call and inform him please.   He will be holding the statin for a total of 10 days  thanks

## 2021-12-13 DIAGNOSIS — E1065 Type 1 diabetes mellitus with hyperglycemia: Secondary | ICD-10-CM | POA: Diagnosis not present

## 2022-01-01 DIAGNOSIS — M13842 Other specified arthritis, left hand: Secondary | ICD-10-CM | POA: Diagnosis not present

## 2022-01-01 DIAGNOSIS — M79645 Pain in left finger(s): Secondary | ICD-10-CM | POA: Diagnosis not present

## 2022-01-02 ENCOUNTER — Emergency Department: Payer: BC Managed Care – PPO

## 2022-01-02 ENCOUNTER — Other Ambulatory Visit: Payer: Self-pay

## 2022-01-02 ENCOUNTER — Emergency Department
Admission: EM | Admit: 2022-01-02 | Discharge: 2022-01-02 | Disposition: A | Discharge status: Home / Self Care | Payer: BC Managed Care – PPO | Attending: Emergency Medicine | Admitting: Emergency Medicine

## 2022-01-02 DIAGNOSIS — M4322 Fusion of spine, cervical region: Secondary | ICD-10-CM | POA: Diagnosis not present

## 2022-01-02 DIAGNOSIS — R55 Syncope and collapse: Secondary | ICD-10-CM | POA: Diagnosis not present

## 2022-01-02 DIAGNOSIS — Z20822 Contact with and (suspected) exposure to covid-19: Secondary | ICD-10-CM | POA: Insufficient documentation

## 2022-01-02 DIAGNOSIS — Z981 Arthrodesis status: Secondary | ICD-10-CM | POA: Diagnosis not present

## 2022-01-02 DIAGNOSIS — S199XXA Unspecified injury of neck, initial encounter: Secondary | ICD-10-CM | POA: Diagnosis not present

## 2022-01-02 DIAGNOSIS — M25522 Pain in left elbow: Secondary | ICD-10-CM | POA: Diagnosis not present

## 2022-01-02 DIAGNOSIS — R739 Hyperglycemia, unspecified: Secondary | ICD-10-CM

## 2022-01-02 DIAGNOSIS — M47812 Spondylosis without myelopathy or radiculopathy, cervical region: Secondary | ICD-10-CM | POA: Diagnosis not present

## 2022-01-02 DIAGNOSIS — Z794 Long term (current) use of insulin: Secondary | ICD-10-CM | POA: Diagnosis not present

## 2022-01-02 DIAGNOSIS — S0990XA Unspecified injury of head, initial encounter: Secondary | ICD-10-CM | POA: Diagnosis not present

## 2022-01-02 DIAGNOSIS — R000 Tachycardia, unspecified: Secondary | ICD-10-CM | POA: Diagnosis not present

## 2022-01-02 DIAGNOSIS — E1165 Type 2 diabetes mellitus with hyperglycemia: Secondary | ICD-10-CM | POA: Insufficient documentation

## 2022-01-02 LAB — CBC
HCT: 39.8 % (ref 39.0–52.0)
Hemoglobin: 13.4 g/dL (ref 13.0–17.0)
MCH: 30.9 pg (ref 26.0–34.0)
MCHC: 33.7 g/dL (ref 30.0–36.0)
MCV: 91.9 fL (ref 80.0–100.0)
Platelets: 284 10*3/uL (ref 150–400)
RBC: 4.33 MIL/uL (ref 4.22–5.81)
RDW: 12.6 % (ref 11.5–15.5)
WBC: 13.7 10*3/uL — ABNORMAL HIGH (ref 4.0–10.5)
nRBC: 0 % (ref 0.0–0.2)

## 2022-01-02 LAB — CBG MONITORING, ED
Glucose-Capillary: 490 mg/dL — ABNORMAL HIGH (ref 70–99)
Glucose-Capillary: 455 mg/dL — ABNORMAL HIGH (ref 70–99)
Glucose-Capillary: 277 mg/dL — ABNORMAL HIGH (ref 70–99)
Glucose-Capillary: 310 mg/dL — ABNORMAL HIGH (ref 70–99)
Glucose-Capillary: 266 mg/dL — ABNORMAL HIGH (ref 70–99)
Glucose-Capillary: 228 mg/dL — ABNORMAL HIGH (ref 70–99)

## 2022-01-02 LAB — BASIC METABOLIC PANEL
Anion gap: 16 — ABNORMAL HIGH (ref 5–15)
Anion gap: 5 (ref 5–15)
BUN: 27 mg/dL — ABNORMAL HIGH (ref 8–23)
BUN: 24 mg/dL — ABNORMAL HIGH (ref 8–23)
CO2: 17 mmol/L — ABNORMAL LOW (ref 22–32)
CO2: 21 mmol/L — ABNORMAL LOW (ref 22–32)
Calcium: 9.1 mg/dL (ref 8.9–10.3)
Calcium: 7.9 mg/dL — ABNORMAL LOW (ref 8.9–10.3)
Chloride: 92 mmol/L — ABNORMAL LOW (ref 98–111)
Chloride: 102 mmol/L (ref 98–111)
Creatinine, Ser: 1.11 mg/dL (ref 0.61–1.24)
Creatinine, Ser: 0.9 mg/dL (ref 0.61–1.24)
GFR, Estimated: >60 mL/min (ref >60)
GFR, Estimated: >60 mL/min (ref >60)
Glucose, Bld: 469 mg/dL — ABNORMAL HIGH (ref 70–99)
Glucose, Bld: 271 mg/dL — ABNORMAL HIGH (ref 70–99)
Potassium: 4.6 mmol/L (ref 3.5–5.1)
Potassium: 3.9 mmol/L (ref 3.5–5.1)
Sodium: 125 mmol/L — ABNORMAL LOW (ref 135–145)
Sodium: 128 mmol/L — ABNORMAL LOW (ref 135–145)

## 2022-01-02 LAB — URINALYSIS, ROUTINE W REFLEX MICROSCOPIC
Bacteria, UA: NONE SEEN
Bilirubin Urine: NEGATIVE
Glucose, UA: >=500 mg/dL — AB
Hgb urine dipstick: NEGATIVE
Ketones, ur: 20 mg/dL — AB
Leukocytes,Ua: NEGATIVE
Nitrite: NEGATIVE
Protein, ur: NEGATIVE mg/dL
Specific Gravity, Urine: 1.022 (ref 1.005–1.030)
Squamous Epithelial / HPF: NONE SEEN (ref 0–5)
pH: 5 (ref 5.0–8.0)

## 2022-01-02 LAB — RESP PANEL BY RT-PCR (FLU A&B, COVID) ARPGX2
Influenza A by PCR: NEGATIVE
Influenza B by PCR: NEGATIVE
SARS Coronavirus 2 by RT PCR: NEGATIVE

## 2022-01-02 LAB — TROPONIN I (HIGH SENSITIVITY)
Troponin I (High Sensitivity): 3 ng/L (ref <18)
Troponin I (High Sensitivity): 6 ng/L (ref <18)

## 2022-01-02 MED ORDER — INSULIN ASPART 100 UNIT/ML IJ SOLN
10.0000 [IU] | Freq: Once | INTRAMUSCULAR | Status: AC
  Administered 2022-01-02: 10 [IU] via INTRAVENOUS
  Filled 2022-01-02: qty 1

## 2022-01-02 MED ORDER — ONDANSETRON HCL 4 MG/2ML IJ SOLN
4.0000 mg | Freq: Once | INTRAMUSCULAR | Status: AC
  Administered 2022-01-02: 4 mg via INTRAVENOUS
  Filled 2022-01-02: qty 2

## 2022-01-02 MED ORDER — INSULIN ASPART 100 UNIT/ML IJ SOLN: 6.0000 [IU] | Freq: Once | INTRAMUSCULAR | Status: DC

## 2022-01-02 MED ORDER — SODIUM CHLORIDE 0.9 % IV BOLUS
1000.0000 mL | Freq: Once | INTRAVENOUS | Status: AC
  Administered 2022-01-02: 1000 mL via INTRAVENOUS

## 2022-01-02 MED ORDER — KETOROLAC TROMETHAMINE 30 MG/ML IJ SOLN
15.0000 mg | Freq: Once | INTRAMUSCULAR | Status: AC
  Administered 2022-01-02: 15 mg via INTRAVENOUS
  Filled 2022-01-02: qty 1

## 2022-01-02 MED ORDER — LACTATED RINGERS IV BOLUS
1000.0000 mL | Freq: Once | INTRAVENOUS | Status: AC
Start: 2022-01-02 — End: 2022-01-02
  Administered 2022-01-02: 1000 mL via INTRAVENOUS

## 2022-01-02 MED ORDER — INSULIN ASPART 100 UNIT/ML IJ SOLN
8.0000 [IU] | Freq: Once | INTRAMUSCULAR | Status: AC
  Administered 2022-01-02: 8 [IU] via INTRAVENOUS
  Filled 2022-01-02: qty 1

## 2022-01-02 MED ORDER — ONDANSETRON HCL 4 MG/2ML IJ SOLN
4.0000 mg | Freq: Once | INTRAMUSCULAR | Status: AC
Start: 2022-01-02 — End: 2022-01-02
  Administered 2022-01-02: 4 mg via INTRAVENOUS
  Filled 2022-01-02: qty 2

## 2022-01-02 NOTE — ED Triage Notes (Signed)
Pt comes with c/o hyperglycemia. Pt states his sugars have been reading high. Pt was here checking in and then had a syncopal episode in lobby. Pt denies hitting head. Pt did hurt left elbow.  Pt is A*OX4.

## 2022-01-02 NOTE — ED Notes (Addendum)
As RN coming around the First RN counter to place wrist band on patient, the patient had a syncopal episode and fell to the ground.  Pt did not hit his head on the ground and was only unconscious for less than 5 seconds.  Pt does states his left elbow hurts after the fall.  PT assisted to a seated position with RN assistance and then placed in a wheelchair.  Pt able to answer all questions appropriately with RN at this time. Safety zone placed.

## 2022-01-02 NOTE — ED Provider Notes (Signed)
[facility] Provider Note    Event Date/Time   First MD Initiated Contact with Patient 01/02/22 1542     (approximate)  History   Chief Complaint: Loss of Consciousness  HPI  Martin Deleon is a 66 y.o. male past medical history of diabetes with an insulin pump presents to the emergency department for elevated blood sugar.  According to the patient he has been having some discomfort in his left thumb drainage.  Patient had a cortisone injection yesterday, states ever since he has been experiencing hyperglycemia.  Patient states he checked his blood sugar this morning it was 400 himself insulin rechecked it around noon and it was still greater than 400.  He dosed himself 10 additional units of insulin at 1230.  States he began feeling nauseated so he came to the emergency department.  States while in the lobby had a brief near syncopal episode in which he fell hitting his elbow.  States no pain in the elbow states just feels like a mild bruise.  Did not hit his head.  Physical Exam   Triage Vital Signs: ED Triage Vitals  Enc Vitals Group     BP 01/02/22 1437 (!) 162/90     Pulse Rate 01/02/22 1437 (!) 115     Resp 01/02/22 1437 18     Temp 01/02/22 1437 98 F (36.7 C)     Temp src --      SpO2 01/02/22 1437 95 %     Weight --      Height --      Head Circumference --      Peak Flow --      Pain Score 01/02/22 1435 5     Pain Loc --      Pain Edu? --      Excl. in [place]? --     Most recent vital signs: Vitals:   01/02/22 1437  BP: (!) 162/90  Pulse: (!) 115  Resp: 18  Temp: 98 F (36.7 C)  SpO2: 95%    General: Awake, no distress.  CV:  Good peripheral perfusion.  Regular rate and rhythm around 100 bpm Resp:  Normal effort.  Equal breath sounds bilaterally.  Abd:  No distention.  Soft, nontender.  No rebound or guarding.   ED Results / Procedures / Treatments   EKG  EKG viewed and interpreted by myself shows sinus tachycardia 104 bpm  with a narrow QRS, normal axis, normal intervals, no concerning ST changes.  RADIOLOGY  I personally viewed the x-ray images.  No acute fractures see my evaluation. Radiology has read the x-ray as mild osteoarthritis.   MEDICATIONS ORDERED IN ED: Medications  sodium chloride 0.9 % bolus 1,000 mL (has no administration in time range)  sodium chloride 0.9 % bolus 1,000 mL (has no administration in time range)  ondansetron (ZOFRAN) injection 4 mg (has no administration in time range)  insulin aspart (novoLOG) injection 10 Units (has no administration in time range)     IMPRESSION / MDM / ASSESSMENT AND PLAN / ED COURSE  I reviewed the triage vital signs and the nursing notes.  Patient presents to the emergency department for hyperglycemia.  Patient states since getting a cortisone injection yesterday he has had high blood sugar and had trouble bringing glucose down.  Patient blood glucose upon arrival is 490.  He has not dosed insulin since 1230 (greater than 3 hours ago).  Patient's lab work is resulting showing hyperglycemia with pseudohyponatremia  as well as a borderline anion gap of 16.  Patient states mild nausea.  Patient denies any fever at any point.  Denies any cough or congestion.  Remainder the patient's work-up is overall reassuring, slight leukocytosis of 13,000.  Given the patient's hyperglycemia with a borderline anion gap we will dose 2 L of normal saline, 10 units of insulin aspart and recheck a chemistry.  Patient states mild nausea we will treat with IV Zofran and continue to closely monitor.  Patient agreeable plan of care.  I would also like to check urine once hydrated.  Patient was redosed with IV insulin given blood glucose increased back to 320.  Given additional liter of lactated Ringer's IV Toradol and IV Zofran.  Patient states he is feeling much better.  Blood sugars down to 226 on last check.  Patient states he is ready to go home.  I did consider admission given the  patient's significant hyperglycemia and borderline elevated anion gap however after IV medications and fluids in the emergency department patient appears much better with reassuring lab work.  I believe the patient is safe for discharge home with outpatient follow-up.  FINAL CLINICAL IMPRESSION(S) / ED DIAGNOSES   Hyperglycemia  Rx / DC Orders   PCP follow-up, oral fluids at home.  Note:  This document was prepared using Dragon voice recognition software and may include unintentional dictation errors.   Harvest Dark, MD 01/02/22 2312

## 2022-01-02 NOTE — Discharge Instructions (Signed)
Patient plenty of nonsugar fluids and monitor your blood glucose closely over the next several days.  Please follow-up with your doctor to inform them of today's ER visit.  Return to the emergency department for any symptom personally concerning to yourself.

## 2022-01-03 DIAGNOSIS — R739 Hyperglycemia, unspecified: Secondary | ICD-10-CM | POA: Diagnosis not present

## 2022-01-07 DIAGNOSIS — Z9641 Presence of insulin pump (external) (internal): Secondary | ICD-10-CM | POA: Diagnosis not present

## 2022-01-07 DIAGNOSIS — E109 Type 1 diabetes mellitus without complications: Secondary | ICD-10-CM | POA: Diagnosis not present

## 2022-01-08 DIAGNOSIS — E1065 Type 1 diabetes mellitus with hyperglycemia: Secondary | ICD-10-CM | POA: Diagnosis not present

## 2022-01-18 ENCOUNTER — Other Ambulatory Visit: Payer: Self-pay | Admitting: Nurse Practitioner

## 2022-01-18 DIAGNOSIS — R112 Nausea with vomiting, unspecified: Secondary | ICD-10-CM

## 2022-01-23 ENCOUNTER — Other Ambulatory Visit: Payer: Self-pay | Admitting: Nurse Practitioner

## 2022-01-23 DIAGNOSIS — R112 Nausea with vomiting, unspecified: Secondary | ICD-10-CM

## 2022-02-04 DIAGNOSIS — Z9641 Presence of insulin pump (external) (internal): Secondary | ICD-10-CM | POA: Diagnosis not present

## 2022-02-04 DIAGNOSIS — E109 Type 1 diabetes mellitus without complications: Secondary | ICD-10-CM | POA: Diagnosis not present

## 2022-02-07 DIAGNOSIS — E1065 Type 1 diabetes mellitus with hyperglycemia: Secondary | ICD-10-CM | POA: Diagnosis not present

## 2022-02-26 ENCOUNTER — Ambulatory Visit (INDEPENDENT_AMBULATORY_CARE_PROVIDER_SITE_OTHER)
Admission: RE | Admit: 2022-02-26 | Discharge: 2022-02-26 | Disposition: A | Discharge status: Home / Self Care | Payer: BC Managed Care – PPO | Source: Ambulatory Visit | Attending: Primary Care | Admitting: Primary Care

## 2022-02-26 ENCOUNTER — Encounter: Payer: Self-pay | Admitting: Primary Care

## 2022-02-26 ENCOUNTER — Other Ambulatory Visit: Payer: Self-pay

## 2022-02-26 ENCOUNTER — Ambulatory Visit: Payer: BC Managed Care – PPO | Admitting: Primary Care

## 2022-02-26 VITALS — BP 138/64 | HR 83 | Temp 98.6°F | Ht 70.0 in | Wt 166.0 lb

## 2022-02-26 DIAGNOSIS — G8929 Other chronic pain: Secondary | ICD-10-CM | POA: Diagnosis not present

## 2022-02-26 DIAGNOSIS — G4452 New daily persistent headache (NDPH): Secondary | ICD-10-CM

## 2022-02-26 DIAGNOSIS — M542 Cervicalgia: Secondary | ICD-10-CM | POA: Diagnosis not present

## 2022-02-26 DIAGNOSIS — M47812 Spondylosis without myelopathy or radiculopathy, cervical region: Secondary | ICD-10-CM | POA: Diagnosis not present

## 2022-02-26 HISTORY — DX: New daily persistent headache (ndph): G44.52

## 2022-02-26 MED ORDER — KETOROLAC TROMETHAMINE 60 MG/2ML IM SOLN
60.0000 mg | Freq: Once | INTRAMUSCULAR | Status: AC
  Administered 2022-02-26: 60 mg via INTRAMUSCULAR

## 2022-02-26 MED ORDER — RIZATRIPTAN BENZOATE 5 MG PO TABS: ORAL_TABLET | ORAL | 0 refills | Status: DC

## 2022-02-26 NOTE — Patient Instructions (Signed)
Complete xray(s) prior to leaving today. I will notify you of your results once received. ? ?Start rizatriptan (Maxalt) 5 mg for headache.  Take 1 tablet by mouth at migraine onset, may take an additional tablet 2 hours later if needed. ? ?You will be contacted regarding your CT scan.  Please let us know if you have not been contacted within two days.  ? ?Please update me tomorrow regarding your headache. ? ?It was a pleasure to see you today! ? ? ? ?

## 2022-02-26 NOTE — Assessment & Plan Note (Signed)
Unclear etiology at this point. ?Differentials include COVID-19 virus infection, migraines, neck pain. ? ?Given his symptoms of imbalance and difficulty with memory need to rule out bleed post fall.  Reviewed CT head and C-spine from January 2023, however he underwent imaging within 15 minutes of falling. ? ?Repeat CT head pending. ?We will check cervical spine plain films today. ? ?IM Toradol 60 mg provided today. ?Prescription for rizatriptan 5 mg sent to pharmacy as this was effective historically with his prior migraines. ? ?Await results. ?He will update tomorrow morning regarding his headache. ? ?

## 2022-02-26 NOTE — Progress Notes (Signed)
? ?Subjective:  ? ? Patient ID: Martin Deleon, male    DOB: 06-07-1956, 66 y.o.   MRN: [id number] ? ?HPI ? ?Martin Deleon is a very pleasant 66 y.o. male with a history of hypertension, migraines, type 1 diabetes, osteoarthritis, chronic neck pain, anxiety/depression, COVID-19 infection who presents today to discuss headaches.  ? ?Chronic since late December 2022 which began while having Covid-19 infection. Headaches are located to the bilateral occipital lobes without radiation. His headaches occur daily and are apparent when waking up in the morning and going to bed at night. He does experience chronic neck tightness, no worse than usual.  ? ?He's taken Ibuprofen 800 mg without improvement.  He has been taking hydromorphone 2 mg with promethazine as needed for headaches with temporary relief. He does have intermittent chronic nausea. He denies photophobia, phonophobia.  ? ?History of intermittent headaches for the last 15 years which were typically located to the frontal and temporal lobes. History of chronic neck pain, underwent neck surgery 1-2 years ago, neck pain has improved since his surgery. He has called his orthopedic doctor who is trying to work him in.  ? ?He underwent CT head and C-Spine 2 months ago at [facility] ED due to a syncopal episode that occurred while checking in. He was told that he "hit my head hard". He immediately underwent imaging after his fall, no abnormality on imaging. His headaches began prior to his syncopal episode and are no worse after hitting his head, but he's noticed gradual changes in his ability to remember to do things at work and also with his balance. He feels imbalanced at times. He finds that he cannot remember passwords and work protocol for which he's been around for years.  ? ?He has an appointment scheduled with his eye doctor.  ? ?He was once managed on Maxalt for migraine abortion and did well. ? ?Review of Systems  ?Eyes:  Positive for visual disturbance.   ?Musculoskeletal:  Positive for arthralgias and neck pain.  ?Neurological:  Positive for light-headedness and headaches.  ? ?   ? ? ?Past Medical History:  ?Diagnosis Date  ? Anginal pain ([place])   ? Anxiety   ? Arthritis   ? Diabetes mellitus without complication ([place])   ? Headache   ? Hypercholesteremia   ? Hypertension   ? Insulin pump in place   ? Neuropathy due to secondary diabetes mellitus ([place])   ? PONV (postoperative nausea and vomiting)   ? ? ?Social History  ? ?Socioeconomic History  ? Marital status: Married  ?  Spouse name: Not on file  ? Number of children: Not on file  ? Years of education: Not on file  ? Highest education level: Not on file  ?Occupational History  ? Not on file  ?Tobacco Use  ? Smoking status: Former  ?  Packs/day: 1.00  ?  Types: Cigarettes  ?  Quit date: 03/16/1997  ?  Years since quitting: 24.9  ? Smokeless tobacco: Never  ?Substance and Sexual Activity  ? Alcohol use: No  ? Drug use: Yes  ?  Types: Marijuana  ? Sexual activity: Not on file  ?Other Topics Concern  ? Not on file  ?Social History Narrative  ? Married.  ? 2 children.  ? Works as a Nature conservation officer.  ? Enjoys golfing.   ? ?Social Determinants of Health  ? ?Financial Resource Strain: Not on file  ?Food Insecurity: Not on file  ?Transportation Needs: Not on file  ?  Physical Activity: Not on file  ?Stress: Not on file  ?Social Connections: Not on file  ?Intimate Partner Violence: Not on file  ? ? ?Past Surgical History:  ?Procedure Laterality Date  ? APPENDECTOMY    ? CARDIAC CATHETERIZATION    ? CERVICAL FUSION  2005, 2006  ? [place] Dr. Glenna Fellows  ? COLONOSCOPY WITH PROPOFOL N/A 05/12/2018  ? Procedure: COLONOSCOPY WITH PROPOFOL;  Surgeon: Lucilla Lame, MD;  Location[facility] ENDOSCOPY;  Service: Endoscopy;  Laterality: N/A;  ? FRACTURE SURGERY Right August 05, 1986  ? Elbow  ? 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 Procedure: TOTAL HIP ARTHROPLASTY;  Surgeon: Christophe Louis, MD;  Location: [facility] ORS;  Service:  Orthopedics;  Laterality: Left;  ? ? ?Family History  ?Problem Relation Age of Onset  ? Heart disease Father   ? Heart attack Father 41  ? Penile cancer Father   ? Arrhythmia Brother   ? ? ?Allergies  ?Allergen Reactions  ? Codeine Nausea And Vomiting  ? ? ?Current Outpatient Medications on File Prior to Visit  ?Medication Sig Dispense Refill  ? DULoxetine (CYMBALTA) 20 MG capsule TAKE 2 CAPSULES BY MOUTH ONCE DAILY FOR ANXIETY AND FOR DEPRESSION 180 capsule 0  ? gabapentin (NEURONTIN) 800 MG tablet TAKE 1 TABLET BY MOUTH THREE TIMES DAILY FOR  NEUROPATHIC  PAIN 90 tablet 0  ? HYDROmorphone (DILAUDID) 2 MG tablet Take 1 tablet (2 mg total) by mouth every 4 (four) hours as needed for severe pain. 20 tablet 0  ? Insulin Human (INSULIN PUMP) SOLN Inject into the skin every hour. Humalog Insulin Pump--patient stated he has five different rates depending on the time of day    ? insulin lispro (HUMALOG) 100 UNIT/ML injection SMARTSIG:0-80 Unit(s) SUB-Q Daily    ? lisinopril (ZESTRIL) 30 MG tablet Take 1 tablet (30 mg total) by mouth daily. For blood pressure. 90 tablet 3  ? methocarbamol (ROBAXIN) 500 MG tablet Take 1 tablet by mouth 3 (three) times daily.    ? promethazine (PHENERGAN) 12.5 MG tablet TAKE 1 TO 2 TABLETS BY MOUTH EVERY 8 HOURS AS NEEDED FOR NAUSEA AND VOMITING 20 tablet 0  ? simvastatin (ZOCOR) 40 MG tablet Take 1 tablet (40 mg total) by mouth every evening. For cholesterol. 90 tablet 1  ? ?No current facility-administered medications on file prior to visit.  ? ? ?BP 138/64   Pulse 83   Temp 98.6 ?F (37 ?C) (Oral)   Ht '5\' 10"'$  (1.778 m)   Wt 166 lb (75.3 kg)   SpO2 97%   BMI 23.82 kg/m?  ?Objective:  ? Physical Exam ?Eyes:  ?   Extraocular Movements: Extraocular movements intact.  ?Pulmonary:  ?   Effort: Pulmonary effort is normal.  ?Musculoskeletal:  ?   Cervical back: Normal range of motion.  ?Skin: ?   General: Skin is warm and dry.  ?Neurological:  ?   Mental Status: He is alert and oriented to  person, place, and time.  ?   Coordination: Coordination normal.  ?   Gait: Gait normal.  ? ? ? ? ? ?   ?Assessment & Plan:  ? ? ? ? ?This visit occurred during the SARS-CoV-2 public health emergency.  Safety protocols were in place, including screening questions prior to the visit, additional usage of staff PPE, and extensive cleaning of exam room while observing appropriate contact time as indicated for disinfecting solutions.  ?

## 2022-02-27 ENCOUNTER — Telehealth: Payer: Self-pay | Admitting: Primary Care

## 2022-02-27 NOTE — Telephone Encounter (Signed)
rizatriptan (MAXALT) 5 MG tablet not touching the headache,  ? ?Went home after work, ate 2 sandwiches, and then had the worse diarrhea he has ever had, hurt stomach (worse that a colonoscopy cleansing) thinks it may be the injection he received in office yesterday ? ?Already took the first does,  will take another soon and go home  ? ?No energy, drained, going  ? ?May need a note for today for work, he will let you know ?

## 2022-02-27 NOTE — Telephone Encounter (Signed)
Noted. ? ?Yes, I am happy to write him out of work this week.  I can attach the note to his MyChart portal if that works for him, let me know. ? ?He should be contacted soon regarding his CT head as this was a stat order. ? ?I will CC [place]. ? ? ?

## 2022-02-27 NOTE — Telephone Encounter (Signed)
Called and spoke with pt he said when he got home he took the other dose of maxalt for his headache and has not help any. He wanted to know if he can have a work know taking him out of work for rest of the week.Please advise  ?

## 2022-02-28 NOTE — Telephone Encounter (Signed)
Noted, will attach now. ?

## 2022-02-28 NOTE — Telephone Encounter (Signed)
Patient has CT set up. Would like note sent to his my chart for today 3/16 to 3/20.  ?

## 2022-03-01 ENCOUNTER — Other Ambulatory Visit: Payer: Self-pay

## 2022-03-01 ENCOUNTER — Ambulatory Visit
Admission: RE | Admit: 2022-03-01 | Discharge: 2022-03-01 | Disposition: A | Discharge status: Home / Self Care | Payer: BC Managed Care – PPO | Source: Ambulatory Visit | Attending: Primary Care | Admitting: Primary Care

## 2022-03-01 DIAGNOSIS — G4452 New daily persistent headache (NDPH): Secondary | ICD-10-CM | POA: Insufficient documentation

## 2022-03-01 DIAGNOSIS — R519 Headache, unspecified: Secondary | ICD-10-CM | POA: Diagnosis not present

## 2022-03-01 NOTE — Telephone Encounter (Signed)
Called patient let know letter available in my chart.  ?

## 2022-03-04 ENCOUNTER — Other Ambulatory Visit: Payer: Self-pay

## 2022-03-04 DIAGNOSIS — I10 Essential (primary) hypertension: Secondary | ICD-10-CM

## 2022-03-04 DIAGNOSIS — F32A Depression, unspecified: Secondary | ICD-10-CM

## 2022-03-04 MED ORDER — LISINOPRIL 30 MG PO TABS: 30.0000 mg | ORAL_TABLET | Freq: Every day | ORAL | 0 refills | Status: DC

## 2022-03-04 MED ORDER — DULOXETINE HCL 20 MG PO CPEP: ORAL_CAPSULE | ORAL | 0 refills | Status: DC

## 2022-03-06 DIAGNOSIS — G4452 New daily persistent headache (NDPH): Secondary | ICD-10-CM

## 2022-03-06 NOTE — Telephone Encounter (Signed)
Spoke to patient by telephone and was advised that he has taken some time off from work but is back at work today. Patient stated that he is getting ready to go home shortly. Patient stated that he has been taking Maxalt and Excedrin Migraine which has not helped much. Patient stated that he seems to be doing better in the morning when he first gets up. Patient stated that his headache seems to get worse later in the day after he has been up moving around. Patient stated that his head hurts starting at the back of his head and moves forward. Patient was given ER precautions and he verbalized understanding. Patient stated if his symptoms get worse or intensify he will go to the ER. Patient stated that his headaches are not new and has been going on for a while.  ?

## 2022-03-07 MED ORDER — TOPIRAMATE 50 MG PO TABS: 50.0000 mg | ORAL_TABLET | Freq: Every day | ORAL | 0 refills | Status: DC

## 2022-03-09 DIAGNOSIS — E1065 Type 1 diabetes mellitus with hyperglycemia: Secondary | ICD-10-CM | POA: Diagnosis not present

## 2022-03-15 NOTE — Telephone Encounter (Signed)
I teams Martin Deleon and she advised pt should call DR Manuella Ghazi at [facility] for appt [phone]. I spoke with pt; pt said he has tried new med for 1 wk and has not seen any improvement so far; pt has H/A now with pain level of 7. Pt feels like eyeballs are thumping.Pt is nauseated and dizzy on and off where room spins. Advised pt should not drive if dizzy where room spins pt voiced understanding and cannot ck BP now due to being at work; pt is very afraid of losing his job. Pt said having heart palpitations now with heart rate 120. Pt is having mid to lt side CP that is radiating into lt jaw;no SOB . Pt said he has angina and had cath several yrs ago; pt cannot tell if this CP is what he has on and off or is different. Pt having numbness in feet and hands on and off and has difficulty bending fingers and toes. Pt said feels like hands and feet are swelling. Also pts blood sugar this morning has been between 69-210 but pt has bolus with insulin pump and pt thinks blood sugar is OK. Pt said he needs to work and does not want to go to ED for eval. 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 Pt wants [name] to know what is going on. I also gave contact info for pt to call Dr Janyth Pupa office which closes on Fridays at noon. Pt voiced understanding and he will call Dr Janyth Pupa office. Again UC & ED precautions given and pt voiced understanding; sending note to Gentry Fitz NP and Lavina Hamman CMA and will teams [name] and [place]. ?

## 2022-03-15 NOTE — Telephone Encounter (Signed)
I just spoke with pt; pt said he is at home now; BP 138/91 P 97; pt said he is resting in dark room and at present is feeling calm and heart is not racing. Pt said the CP now has eased somewhat and H/A is slightly better. Pt was not able to get Dr Trena Platt office this morning due to work coming at him 4 different ways but pt will try to call on 03/18/22 about appt. Pt sounds more peaceful now and plans to relax. Pt said if he worsened he would go to ED. Sending note to Gentry Fitz NP and will teams [place]. ?

## 2022-03-15 NOTE — Telephone Encounter (Signed)
Can we call him back to find out what his BP is running? ?

## 2022-03-20 DIAGNOSIS — G250 Essential tremor: Secondary | ICD-10-CM | POA: Diagnosis not present

## 2022-03-20 DIAGNOSIS — G479 Sleep disorder, unspecified: Secondary | ICD-10-CM | POA: Diagnosis not present

## 2022-03-20 DIAGNOSIS — R519 Headache, unspecified: Secondary | ICD-10-CM | POA: Diagnosis not present

## 2022-03-20 DIAGNOSIS — R413 Other amnesia: Secondary | ICD-10-CM | POA: Diagnosis not present

## 2022-04-01 DIAGNOSIS — M25552 Pain in left hip: Secondary | ICD-10-CM | POA: Diagnosis not present

## 2022-04-08 DIAGNOSIS — E1065 Type 1 diabetes mellitus with hyperglycemia: Secondary | ICD-10-CM | POA: Diagnosis not present

## 2022-04-15 DIAGNOSIS — E1065 Type 1 diabetes mellitus with hyperglycemia: Secondary | ICD-10-CM | POA: Diagnosis not present

## 2022-04-15 DIAGNOSIS — Z6823 Body mass index (BMI) 23.0-23.9, adult: Secondary | ICD-10-CM | POA: Diagnosis not present

## 2022-04-15 DIAGNOSIS — E782 Mixed hyperlipidemia: Secondary | ICD-10-CM | POA: Diagnosis not present

## 2022-04-15 DIAGNOSIS — I10 Essential (primary) hypertension: Secondary | ICD-10-CM | POA: Diagnosis not present

## 2022-04-15 NOTE — Telephone Encounter (Signed)
Have called left message to call office to get more information.  ?

## 2022-04-16 NOTE — Progress Notes (Signed)
Noted. Will discuss at upcoming visit

## 2022-04-16 NOTE — Telephone Encounter (Signed)
Patient called back. He wanted to talk about getting some fmla filled out to take to time off work to see if symptoms improved. I have made appointment in office.  ?

## 2022-04-16 NOTE — Telephone Encounter (Signed)
[facility] - Client ?Nonclinical Telephone Record  ?[facility]? ?Client [place] [facility] - Client ?Client Site [place] ?Provider Alma Friendly - NP ?Contact Type Call ?Who Is Calling Patient / Member / Family / Caregiver ?Caller Name Tierre Netto ?Caller Phone Number [phone] ?Call Type Message Only Information Provided ?Reason for Call Returning a Call from the Office ?Initial Comment Caller states returning call from [place]. ?Additional Comment hrs prov ?Disp. Time Disposition Final User ?04/16/2022 7:31:53 AM General Information Provided Yes Idolina Primer ?Call Closed By: Idolina Primer ?Transaction Date/Time: 04/16/2022 7:30:30 AM (ET ? ? ? ?Per note from [place]; [place] has already spoken with pt this morning. ?

## 2022-04-19 ENCOUNTER — Ambulatory Visit: Payer: BC Managed Care – PPO | Admitting: Primary Care

## 2022-04-19 ENCOUNTER — Encounter: Payer: Self-pay | Admitting: Primary Care

## 2022-04-19 DIAGNOSIS — G4452 New daily persistent headache (NDPH): Secondary | ICD-10-CM | POA: Diagnosis not present

## 2022-04-19 DIAGNOSIS — G43709 Chronic migraine without aura, not intractable, without status migrainosus: Secondary | ICD-10-CM

## 2022-04-19 NOTE — Progress Notes (Signed)
? ?Subjective:  ? ? Patient ID: Martin Deleon, male    DOB: 03-Jun-1956, 66 y.o.   MRN: [id number] ? ?HPI ? ?Martin Deleon is a very pleasant 66 y.o. male with a history of migraines, hypertension, type 1 diabetes, osteoarthritis, hyperlipidemia, anxiety and depression who presents today requesting leave of absence from work. ? ?He was last evaluated by me on 02/26/2022 for chronic and persistent headaches/migraines since December 2022 after COVID-19 infection.  During this visit he endorsed daily headaches to the bilateral occipital lobes without radiation, chronic neck tightness.  He has a history of headaches/migraines previously for which were suspected to be secondary to his chronic neck pain.  During this visit we reviewed his CT head and C-spine from January 2023 which were negative.  We obtained a repeat CT scan of his head which was negative.  He was treated with IM Toradol 60 mg in the office and a prescription for rizatriptan 5 mg to use as needed for migraine abortion. ? ?Since that visit he is continued to experience frequent headaches/migraines to the occipital lobes. His headache is mild when waking in the morning but progresses throughout the day. He does go to bed with a headache.  ? ?The rizatriptan helped to abort migraines but did not help with headaches.  He was initiated on Topamax 50 mg at bedtime for which caused irregular heart rate so he stopped.  ? ?He's since developed bilateral upper extremity paresthesias and pain to the fingers with radiation up to his upper forearm. History of carpal tunnel syndrome to left upper extremity 1-2 years ago.  ? ?Evaluated by neurology last week who prescribed duloxetine 40 mg daily which helped with anxiety but not with headaches. He has a follow up visit scheduled next week with his neurologist. ? ?He's missed a lot of work as he has to leave work early due to ongoing headaches and fatigue. He also took a week off from work in early April. He walks a  lot at work, 10,000+ steps daily. He would like a 10 day leave of absence to see if that helps with symptoms with a start date of  ? ?BP Readings from Last 3 Encounters:  ?04/19/22 (!) 144/82  ?02/26/22 138/64  ?01/02/22 102/65  ? ? ? ?Review of Systems  ?Respiratory:  Negative for shortness of breath.   ?Cardiovascular:  Negative for chest pain.  ?Musculoskeletal:  Positive for arthralgias and neck pain.  ?Neurological:  Positive for headaches.  ? ?   ? ? ?Past Medical History:  ?Diagnosis Date  ? Anginal pain ([place])   ? Anxiety   ? Arthritis   ? Diabetes mellitus without complication ([place])   ? Headache   ? Hypercholesteremia   ? Hypertension   ? Insulin pump in place   ? Neuropathy due to secondary diabetes mellitus ([place])   ? PONV (postoperative nausea and vomiting)   ? ? ?Social History  ? ?Socioeconomic History  ? Marital status: Married  ?  Spouse name: Not on file  ? Number of children: Not on file  ? Years of education: Not on file  ? Highest education level: Not on file  ?Occupational History  ? Not on file  ?Tobacco Use  ? Smoking status: Former  ?  Packs/day: 1.00  ?  Types: Cigarettes  ?  Quit date: 03/16/1997  ?  Years since quitting: 25.1  ? Smokeless tobacco: Never  ?Substance and Sexual Activity  ? Alcohol use: No  ?  Drug use: Yes  ?  Types: Marijuana  ? Sexual activity: Not on file  ?Other Topics Concern  ? Not on file  ?Social History Narrative  ? Married.  ? 2 children.  ? Works as a Nature conservation officer.  ? Enjoys golfing.   ? ?Social Determinants of Health  ? ?Financial Resource Strain: Not on file  ?Food Insecurity: Not on file  ?Transportation Needs: Not on file  ?Physical Activity: Not on file  ?Stress: Not on file  ?Social Connections: Not on file  ?Intimate Partner Violence: Not on file  ? ? ?Past Surgical History:  ?Procedure Laterality Date  ? APPENDECTOMY    ? CARDIAC CATHETERIZATION    ? CERVICAL FUSION  2005, 2006  ? [place] Dr. Glenna Fellows  ? COLONOSCOPY WITH PROPOFOL N/A 05/12/2018  ?  Procedure: COLONOSCOPY WITH PROPOFOL;  Surgeon: Lucilla Lame, MD;  Location: [facility] ENDOSCOPY;  Service: Endoscopy;  Laterality: N/A;  ? FRACTURE SURGERY Right August 05, 1986  ? [place]bow  ? 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 Procedure: TOTAL HIP ARTHROPLASTY;  Surgeon: Christophe Louis, MD;  Location: [facility] ORS;  Service: Orthopedics;  Laterality: Left;  ? ? ?Family History  ?Problem Relation Age of Onset  ? Heart disease Father   ? Heart attack Father 11  ? Penile cancer Father   ? Arrhythmia Brother   ? ? ?Allergies  ?Allergen Reactions  ? Codeine Nausea And Vomiting  ? ? ?Current Outpatient Medications on File Prior to Visit  ?Medication Sig Dispense Refill  ? DULoxetine (CYMBALTA) 20 MG capsule TAKE 2 CAPSULES BY MOUTH ONCE DAILY FOR ANXIETY AND FOR DEPRESSION 180 capsule 0  ? gabapentin (NEURONTIN) 800 MG tablet TAKE 1 TABLET BY MOUTH THREE TIMES DAILY FOR  NEUROPATHIC  PAIN 90 tablet 0  ? HYDROmorphone (DILAUDID) 2 MG tablet Take 1 tablet (2 mg total) by mouth every 4 (four) hours as needed for severe pain. 20 tablet 0  ? Insulin Human (INSULIN PUMP) SOLN Inject into the skin every hour. Humalog Insulin Pump--patient stated he has five different rates depending on the time of day    ? insulin lispro (HUMALOG) 100 UNIT/ML injection SMARTSIG:0-80 Unit(s) SUB-Q Daily    ? lisinopril (ZESTRIL) 30 MG tablet Take 1 tablet (30 mg total) by mouth daily. For blood pressure. 90 tablet 0  ? methocarbamol (ROBAXIN) 500 MG tablet Take 1 tablet by mouth 3 (three) times daily.    ? promethazine (PHENERGAN) 12.5 MG tablet TAKE 1 TO 2 TABLETS BY MOUTH EVERY 8 HOURS AS NEEDED FOR NAUSEA AND VOMITING 20 tablet 0  ? rizatriptan (MAXALT) 5 MG tablet Take 1 tablet by mouth at migraine onset. May repeat in 2 hours if migraine persists. 10 tablet 0  ? simvastatin (ZOCOR) 40 MG tablet Take 1 tablet (40 mg total) by mouth every evening. For cholesterol. 90 tablet 1  ? topiramate (TOPAMAX) 50 MG tablet Take 1 tablet (50 mg total) by  mouth at bedtime. For headache prevention. (Patient not taking: Reported on 04/19/2022) 30 tablet 0  ? ?No current facility-administered medications on file prior to visit.  ? ? ?BP (!) 144/82   Pulse 75   Ht '5\' 10"'$  (1.778 m)   Wt 172 lb 4.8 oz (78.2 kg)   SpO2 96%   BMI 24.72 kg/m?  ?Objective:  ? Physical Exam ?Cardiovascular:  ?   Rate and Rhythm: Normal rate and regular rhythm.  ?Pulmonary:  ?   Effort: Pulmonary effort is normal.  ?  Breath sounds: Normal breath sounds. No wheezing or rales.  ?Musculoskeletal:  ?   Cervical back: Neck supple.  ?Skin: ?   General: Skin is warm and dry.  ?Neurological:  ?   Mental Status: He is alert and oriented to person, place, and time.  ?   Cranial Nerves: No cranial nerve deficit.  ? ? ? ? ? ?   ?Assessment & Plan:  ? ? ? ? ?This visit occurred during the SARS-CoV-2 public health emergency.  Safety protocols were in place, including screening questions prior to the visit, additional usage of staff PPE, and extensive cleaning of exam room while observing appropriate contact time as indicated for disinfecting solutions.  ?

## 2022-04-19 NOTE — Patient Instructions (Signed)
Continue duloxetine 40 mg daily for headaches. ? ?Continue rizatriptan 5 mg as needed for migraines.  ? ?Send me your FMLA paperwork.  ? ?It was a pleasure to see you today! ? ?

## 2022-04-19 NOTE — Assessment & Plan Note (Signed)
Ongoing, but improved with rizatriptan 5 mg. ? ?Continue rizatriptan 5 mg PRN. ?Following with neurology.  ?

## 2022-04-19 NOTE — Assessment & Plan Note (Addendum)
Intolerant to Topamax. ? ?Following with neurology. ?Reviewed neurology office notes from April 2023 which indicate that he should start nortriptyline 10 mg nightly x1 week, then increase to 20 mg nightly thereafter. ? ?The patient believes he is taking Cymbalta.  Discussed that he needs to check his medicine bottles when he gets home and update me. ? ?Continue rizatriptan 5 mg PRN. ? ?Agree to continuous leave of absence to allow for him to recover. Start date of May 8th through May 18th with a return to work date of May 19th.  He will send paperwork ?

## 2022-04-25 NOTE — Telephone Encounter (Signed)
Pt called to schedule but you dont have any openings until 05/08/22 and pt has CPE on 05/10/22. Do you want to see him sooner? Hes also going to get with his work about the [employer] paperwork as well ?

## 2022-04-25 NOTE — Telephone Encounter (Signed)
If his symptoms are improving with rest at home, then okay to see me on the 26th as scheduled.  If you develop new symptoms that he needs to be seen ASAP. ? ?Let me know, thanks ?

## 2022-04-25 NOTE — Telephone Encounter (Signed)
Called patient reviewed all information and repeated back to me. Will call if any questions.  ?If he starts having any new symptoms he will call office for sooner appointment.  ?

## 2022-04-30 ENCOUNTER — Telehealth: Payer: Self-pay

## 2022-04-30 NOTE — Telephone Encounter (Signed)
Received form from [place] in folder for review by [name] with office notes requested.  ?

## 2022-05-01 NOTE — Telephone Encounter (Signed)
Forms completed and placed in Martin Deleon's inbox.  His tentative return to work date is 05/03/2022.  Does he plan on returning at that date? ?

## 2022-05-01 NOTE — Telephone Encounter (Signed)
Left message to return call to our office.  ? ?I have faxed ppw to number provided  ?

## 2022-05-01 NOTE — Telephone Encounter (Signed)
Patient called back and I have spoke to him. He will reach back out to our office today after he talks with neurology. At this time he does not feel like he can go back to work but will let us know later today.  ?

## 2022-05-10 ENCOUNTER — Encounter: Payer: Self-pay | Admitting: Primary Care

## 2022-05-10 ENCOUNTER — Ambulatory Visit (INDEPENDENT_AMBULATORY_CARE_PROVIDER_SITE_OTHER): Payer: BC Managed Care – PPO | Admitting: Primary Care

## 2022-05-10 VITALS — BP 110/70 | HR 67 | Ht 70.0 in | Wt 173.4 lb

## 2022-05-10 DIAGNOSIS — R3912 Poor urinary stream: Secondary | ICD-10-CM

## 2022-05-10 DIAGNOSIS — Z125 Encounter for screening for malignant neoplasm of prostate: Secondary | ICD-10-CM | POA: Diagnosis not present

## 2022-05-10 DIAGNOSIS — F419 Anxiety disorder, unspecified: Secondary | ICD-10-CM

## 2022-05-10 DIAGNOSIS — Z0001 Encounter for general adult medical examination with abnormal findings: Secondary | ICD-10-CM | POA: Diagnosis not present

## 2022-05-10 DIAGNOSIS — I10 Essential (primary) hypertension: Secondary | ICD-10-CM

## 2022-05-10 DIAGNOSIS — F32A Depression, unspecified: Secondary | ICD-10-CM

## 2022-05-10 DIAGNOSIS — E1040 Type 1 diabetes mellitus with diabetic neuropathy, unspecified: Secondary | ICD-10-CM

## 2022-05-10 DIAGNOSIS — R351 Nocturia: Secondary | ICD-10-CM | POA: Diagnosis not present

## 2022-05-10 DIAGNOSIS — R202 Paresthesia of skin: Secondary | ICD-10-CM | POA: Diagnosis not present

## 2022-05-10 DIAGNOSIS — G43709 Chronic migraine without aura, not intractable, without status migrainosus: Secondary | ICD-10-CM

## 2022-05-10 DIAGNOSIS — M542 Cervicalgia: Secondary | ICD-10-CM

## 2022-05-10 DIAGNOSIS — G8929 Other chronic pain: Secondary | ICD-10-CM

## 2022-05-10 DIAGNOSIS — E785 Hyperlipidemia, unspecified: Secondary | ICD-10-CM | POA: Diagnosis not present

## 2022-05-10 DIAGNOSIS — Z8616 Personal history of COVID-19: Secondary | ICD-10-CM

## 2022-05-10 DIAGNOSIS — R112 Nausea with vomiting, unspecified: Secondary | ICD-10-CM

## 2022-05-10 DIAGNOSIS — G4452 New daily persistent headache (NDPH): Secondary | ICD-10-CM

## 2022-05-10 LAB — LIPID PANEL
Cholesterol: 150 mg/dL (ref 0–200)
HDL: 55 mg/dL (ref >39.00)
LDL Cholesterol: 82 mg/dL (ref 0–99)
NonHDL: 95.49
Total CHOL/HDL Ratio: 3
Triglycerides: 69 mg/dL (ref 0.0–149.0)
VLDL: 13.8 mg/dL (ref 0.0–40.0)

## 2022-05-10 LAB — COMPREHENSIVE METABOLIC PANEL
ALT: 14 U/L (ref 0–53)
AST: 19 U/L (ref 0–37)
Albumin: 4.1 g/dL (ref 3.5–5.2)
Alkaline Phosphatase: 89 U/L (ref 39–117)
BUN: 14 mg/dL (ref 6–23)
CO2: 28 mEq/L (ref 19–32)
Calcium: 9 mg/dL (ref 8.4–10.5)
Chloride: 96 mEq/L (ref 96–112)
Creatinine, Ser: 0.87 mg/dL (ref 0.40–1.50)
GFR: 91.11 mL/min (ref >60.00)
Glucose, Bld: 228 mg/dL — ABNORMAL HIGH (ref 70–99)
Potassium: 5.4 mEq/L — ABNORMAL HIGH (ref 3.5–5.1)
Sodium: 131 mEq/L — ABNORMAL LOW (ref 135–145)
Total Bilirubin: 0.4 mg/dL (ref 0.2–1.2)
Total Protein: 5.9 g/dL — ABNORMAL LOW (ref 6.0–8.3)

## 2022-05-10 LAB — PSA: PSA: 2.45 ng/mL (ref 0.10–4.00)

## 2022-05-10 LAB — VITAMIN B12: Vitamin B-12: 179 pg/mL — ABNORMAL LOW (ref 211–911)

## 2022-05-10 MED ORDER — TAMSULOSIN HCL 0.4 MG PO CAPS: 0.4000 mg | ORAL_CAPSULE | Freq: Every day | ORAL | 0 refills | Status: DC

## 2022-05-10 NOTE — Progress Notes (Signed)
Subjective:    Patient ID: Martin Deleon, male    DOB: 1956/05/18, 66 y.o.   MRN: [id number]  HPI  [name] is a very pleasant 66 y.o. male with a history of hypertensions, migraines, type 1 diabetes, osteoarthritis, hyperlipidemia, chronic neck pain who presents today for complete physical and follow up of chronic conditions.  He is currently managed on FMLA for chronic symptoms of bilateral occipital headaches, nausea, imbalance, intermittent and sporadic diaphoresis, intermittent paresthesias to bilateral hands at digits 3 and 4 only, forgetfulness of tasks that he's done routinely for years. History bilateral carpal tunnel syndrome with left sided surgery 1-2 years ago. He's undergone several CT scans of his head, all of which were negative for masses or bleeding.   Currently following with neurology, office visit scheduled with neurologist next week and neurosurgeon next week. He will also be seeing his dentist soon. Evaluated by endocrinology a few weeks ago, A1C was 7.2, no changes made to his regimen.   He cannot function at work as he gets irritated and frustrated with his forgetfulness and recent symptoms. His symptoms have improved since he's been on continuous FMLA. He's focused on relaxing and brain rest. He would like to extend his FMLA through June 6th with return to work date of June 7th.   He would also like to mention weak urinary stream, incomplete bladder emptying, urgency, and nocturia. Chronic for years, getting up to urinate three times nightly, weak urinary stream mostly in the AM when getting up to urinate, improves throughout the day.   BP Readings from Last 3 Encounters:  05/10/22 110/70  04/19/22 (!) 144/82  02/26/22 138/64     Immunizations: -Tetanus: 2020 -Influenza: Completed last season  -Covid-19: 4 vaccines -Shingles: Never completed -Pneumonia: 2008  Diet: [place].  Exercise: No regular exercise.  Eye exam: Completes annually  Dental  exam: Completes semi-annually   Colonoscopy: Completed in 2019, due 2029  PSA: Due  BP Readings from Last 3 Encounters:  05/10/22 110/70  04/19/22 (!) 144/82  02/26/22 138/64     Review of Systems  Constitutional:  Negative for unexpected weight change.  HENT:  Negative for rhinorrhea.   Eyes:  Negative for visual disturbance.  Respiratory:  Negative for shortness of breath.   Cardiovascular:  Negative for chest pain.  Gastrointestinal:  Positive for nausea. Negative for constipation and diarrhea.  Genitourinary:  Positive for difficulty urinating, frequency and urgency.  Musculoskeletal:  Positive for arthralgias and myalgias.  Skin:  Negative for rash.  Allergic/Immunologic: Negative for environmental allergies.  Neurological:  Positive for numbness and headaches. Negative for dizziness.  Psychiatric/Behavioral:  Positive for decreased concentration and sleep disturbance. The patient is nervous/anxious.         Past Medical History:  Diagnosis Date   Anginal pain ([place])    Anxiety    Arthritis    Diabetes mellitus without complication ([facility])    Headache    Hypercholesteremia    Hypertension    Insulin pump in place    Neuropathy due to secondary diabetes mellitus ([facility])    PONV (postoperative nausea and vomiting)     Social History   Socioeconomic History   Marital status: Married    Spouse name: Not on file   Number of children: Not on file   Years of education: Not on file   Highest education level: Not on file  Occupational History   Not on file  Tobacco Use   Smoking status: Former  Packs/day: 1.00    Types: Cigarettes    Quit date: 03/16/1997    Years since quitting: 25.1   Smokeless tobacco: Never  Substance and Sexual Activity   Alcohol use: No   Drug use: Yes    Types: Marijuana   Sexual activity: Not on file  Other Topics Concern   Not on file  Social History Narrative   Married.   2 children.   Works as a Nature conservation officer.   Enjoys  golfing.    Social Determinants of Health   Financial Resource Strain: Not on file  Food Insecurity: Not on file  Transportation Needs: Not on file  Physical Activity: Not on file  Stress: Not on file  Social Connections: Not on file  Intimate Partner Violence: Not on file    Past Surgical History:  Procedure Laterality Date   [place]  2005, 2006   [place] Dr. Glenna Fellows   COLONOSCOPY WITH PROPOFOL N/A 05/12/2018   Procedure: COLONOSCOPY WITH PROPOFOL;  Surgeon: Lucilla Lame, MD;  Location[facility] END[facility]COPY;  Service: Endoscopy;  Laterality: N/A;   FRACTURE SURGERY Right August 05, 1986   Elbow   TOTAL HIP ARTHROPLASTY Left 10/31/2015   Procedure: TOTAL HIP ARTHROPLASTY;  Surgeon: Christophe Louis, MD;  Location: [facility] ORS;  Service: Orthopedics;  Laterality: Left;    Family History  Problem Relation Age of Onset   Heart disease Father    Heart attack Father 54   Penile cancer Father    Arrhythmia Brother     Allergies  Allergen Reactions   Codeine Nausea And Vomiting    Current Outpatient Medications on File Prior to Visit  Medication Sig Dispense Refill   DULoxetine (CYMBALTA) 20 MG capsule TAKE 2 CAPSULES BY MOUTH ONCE DAILY FOR ANXIETY AND FOR DEPRESSION 180 capsule 0   gabapentin (NEURONTIN) 800 MG tablet TAKE 1 TABLET BY MOUTH THREE TIMES DAILY FOR  NEUROPATHIC  PAIN 90 tablet 0   Insulin Human (INSULIN PUMP) SOLN Inject into the skin every hour. Humalog Insulin Pump--patient stated he has five different rates depending on the time of day     insulin lispro (HUMALOG) 100 UNIT/ML injection SMARTSIG:0-80 Unit(s) SUB-Q Daily     lisinopril (ZESTRIL) 30 MG tablet Take 1 tablet (30 mg total) by mouth daily. For blood pressure. 90 tablet 0   methocarbamol (ROBAXIN) 500 MG tablet Take 1 tablet by mouth 3 (three) times daily.     promethazine (PHENERGAN) 12.5 MG tablet TAKE 1 TO 2 TABLETS BY MOUTH EVERY 8 HOURS AS NEEDED  FOR NAUSEA AND VOMITING 20 tablet 0   rizatriptan (MAXALT) 5 MG tablet Take 1 tablet by mouth at migraine onset. May repeat in 2 hours if migraine persists. 10 tablet 0   simvastatin (ZOCOR) 40 MG tablet Take 1 tablet (40 mg total) by mouth every evening. For cholesterol. 90 tablet 1   No current facility-administered medications on file prior to visit.    BP 110/70   Pulse 67   Ht '5\' 10"'$  (1.778 m)   Wt 173 lb 6 oz (78.6 kg)   SpO2 97%   BMI 24.88 kg/m  Objective:   Physical Exam HENT:     Right Ear: Tympanic membrane and ear canal normal.     Left Ear: Tympanic membrane and ear canal normal.     Nose: Nose normal.     Right Sinus: No maxillary sinus tenderness or frontal sinus  tenderness.     Left Sinus: No maxillary sinus tenderness or frontal sinus tenderness.  Eyes:     Conjunctiva/sclera: Conjunctivae normal.  Neck:     Thyroid: No thyromegaly.     Vascular: No carotid bruit.  Cardiovascular:     Rate and Rhythm: Normal rate and regular rhythm.     Heart sounds: Normal heart sounds.  Pulmonary:     Effort: Pulmonary effort is normal.     Breath sounds: Normal breath sounds. No wheezing or rales.  Abdominal:     General: Bowel sounds are normal.     Palpations: Abdomen is soft.     Tenderness: There is no abdominal tenderness.  Musculoskeletal:        General: Normal range of motion.     Cervical back: Neck supple.  Skin:    General: Skin is warm and dry.  Neurological:     Mental Status: He is alert and oriented to person, place, and time.     Cranial Nerves: No cranial nerve deficit.     Deep Tendon Reflexes: Reflexes are normal and symmetric.  Psychiatric:        Mood and Affect: Mood normal.          Assessment & Plan:

## 2022-05-10 NOTE — Assessment & Plan Note (Signed)
Controlled.  Following with endocrinology, office notes and labs reviewed through [place] from May 2023.  Continue insulin as prescribed.

## 2022-05-10 NOTE — Assessment & Plan Note (Signed)
Overall stable.  Agree with neurosurgery follow up given increased migraine/headache symptoms.

## 2022-05-10 NOTE — Assessment & Plan Note (Signed)
Continue simvastatin 40 mg daily. Repeat lipid panel pending.  

## 2022-05-10 NOTE — Assessment & Plan Note (Signed)
Immunizations due: Pneumovax and Shingrix. He will defer these for now given his ongoing symptoms. We will update these at another time.   PSA due and pending. Colonoscopy UTD, due 2029.  Discussed the importance of a healthy diet and regular exercise in order for weight loss, and to reduce the risk of further co-morbidity.  Exam as noted. Labs pending.  Follow up in 1 year for repeat physical.

## 2022-05-10 NOTE — Assessment & Plan Note (Signed)
Unclear if ongoing symptoms are secondary to Covid-19. Continue neurology follow up.  Agree to extend FMLA through June 6th with return to work date of June 7th. He will contact his HR for paperwork.

## 2022-05-10 NOTE — Assessment & Plan Note (Signed)
Intermittent.   Continue promethazine 12.5 mg PRN.

## 2022-05-10 NOTE — Patient Instructions (Signed)
Stop by the lab prior to leaving today. I will notify you of your results once received.   Start tamsulosin 0.4 mg. Take 1 capsule by mouth every evening for urine flow, urine frequency. Please update me in a few weeks.  Follow up with neurology and neurosurgery.  Have your HR department fax me new FMLA paperwork.  It was a pleasure to see you today!  Preventive Care 38-65 Years Old, Male Preventive care refers to lifestyle choices and visits with your health care provider that can promote health and wellness. Preventive care visits are also called wellness exams. What can I expect for my preventive care visit? Counseling During your preventive care visit, your health care provider may ask about your: Medical history, including: Past medical problems. Family medical history. Current health, including: Emotional well-being. Home life and relationship well-being. Sexual activity. Lifestyle, including: Alcohol, nicotine or tobacco, and drug use. Access to firearms. Diet, exercise, and sleep habits. Safety issues such as seatbelt and bike helmet use. Sunscreen use. Work and work Statistician. Physical exam Your health care provider will check your: Height and weight. These may be used to calculate your BMI (body mass index). BMI is a measurement that tells if you are at a healthy weight. Waist circumference. This measures the distance around your waistline. This measurement also tells if you are at a healthy weight and may help predict your risk of certain diseases, such as type 2 diabetes and high blood pressure. Heart rate and blood pressure. Body temperature. Skin for abnormal spots. What immunizations do I need?  Vaccines are usually given at various ages, according to a schedule. Your health care provider will recommend vaccines for you based on your age, medical history, and lifestyle or other factors, such as travel or where you work. What tests do I need? Screening Your  health care provider may recommend screening tests for certain conditions. This may include: Lipid and cholesterol levels. Diabetes screening. This is done by checking your blood sugar (glucose) after you have not eaten for a while (fasting). Hepatitis B test. Hepatitis C test. HIV (human immunodeficiency virus) test. STI (sexually transmitted infection) testing, if you are at risk. Lung cancer screening. Prostate cancer screening. Colorectal cancer screening. Talk with your health care provider about your test results, treatment options, and if necessary, the need for more tests. Follow these instructions at home: Eating and drinking  Eat a diet that includes fresh fruits and vegetables, whole grains, lean protein, and low-fat dairy products. Take vitamin and mineral supplements as recommended by your health care provider. Do not drink alcohol if your health care provider tells you not to drink. If you drink alcohol: Limit how much you have to 0-2 drinks a day. Know how much alcohol is in your drink. In the [place] one drink equals one 12 oz bottle of beer (355 mL), one 5 oz glass of wine (148 mL), or one 1 oz glass of hard liquor (44 mL). Lifestyle Brush your teeth every morning and night with fluoride toothpaste. Floss one time each day. Exercise for at least 30 minutes 5 or more days each week. Do not use any products that contain nicotine or tobacco. These products include cigarettes, chewing tobacco, and vaping devices, such as e-cigarettes. If you need help quitting, ask your health care provider. Do not use drugs. If you are sexually active, practice safe sex. Use a condom or other form of protection to prevent STIs. Take aspirin only as told by your health care  provider. Make sure that you understand how much to take and what form to take. Work with your health care provider to find out whether it is safe and beneficial for you to take aspirin daily. Find healthy ways to manage  stress, such as: Meditation, yoga, or listening to music. Journaling. Talking to a trusted person. Spending time with friends and family. Minimize exposure to UV radiation to reduce your risk of skin cancer. Safety Always wear your seat belt while driving or riding in a vehicle. Do not drive: If you have been drinking alcohol. Do not ride with someone who has been drinking. When you are tired or distracted. While texting. If you have been using any mind-altering substances or drugs. Wear a helmet and other protective equipment during sports activities. If you have firearms in your house, make sure you follow all gun safety procedures. What's next? Go to your health care provider once a year for an annual wellness visit. Ask your health care provider how often you should have your eyes and teeth checked. Stay up to date on all vaccines. This information is not intended to replace advice given to you by your health care provider. Make sure you discuss any questions you have with your health care provider. Document Revised: 05/30/2021 Document Reviewed: 05/30/2021 Elsevier Patient Education  [place].

## 2022-05-10 NOTE — Telephone Encounter (Signed)
Martin Deleon, please set up patient for vitamin B12 injections:  Once weekly x4 weeks Every other week x4 weeks Once monthly thereafter.  Needs lab only appointment scheduled for 3 months to repeat B12 level.

## 2022-05-10 NOTE — Assessment & Plan Note (Signed)
Seems uncontrolled, however patient endorses his headaches are not typical migraines. Continue neurology follow up.  Continue duloxetine 40 mg daily. Continue rizatriptan 5 mg PRN.  Agree to extend FMLA through June 6th with return to work date of June 7th. He will contact his HR for paperwork.

## 2022-05-10 NOTE — Assessment & Plan Note (Signed)
Controlled.  Continue lisinopril 30 mg daily. CMP pending.

## 2022-05-10 NOTE — Assessment & Plan Note (Signed)
Improved.  Continue duloxetine 40 mg daily. Continue to monitor.

## 2022-05-10 NOTE — Assessment & Plan Note (Signed)
Ongoing, CT scans negative. Continue neurology follow up.  Agree to extend FMLA through June 6th with return to work date of June 7th. He will contact his HR for paperwork.

## 2022-05-10 NOTE — Assessment & Plan Note (Signed)
Repeat PSA pending.  Start tamsulosin 0.4 mg daily. He will update.

## 2022-05-17 ENCOUNTER — Other Ambulatory Visit: Payer: Self-pay | Admitting: Primary Care

## 2022-05-17 DIAGNOSIS — I10 Essential (primary) hypertension: Secondary | ICD-10-CM

## 2022-05-17 NOTE — Telephone Encounter (Signed)
Joellen, have you contacted patient regarding his B12 injections? Will you reach out today?

## 2022-05-17 NOTE — Telephone Encounter (Signed)
Called patient made first 4 app with patient message put in last app note on how to proceed with future appointment.

## 2022-05-22 ENCOUNTER — Ambulatory Visit (INDEPENDENT_AMBULATORY_CARE_PROVIDER_SITE_OTHER): Payer: BC Managed Care – PPO

## 2022-05-22 DIAGNOSIS — E538 Deficiency of other specified B group vitamins: Secondary | ICD-10-CM | POA: Diagnosis not present

## 2022-05-22 MED ORDER — CYANOCOBALAMIN 1000 MCG/ML IJ SOLN
1000.0000 ug | Freq: Once | INTRAMUSCULAR | Status: AC
  Administered 2022-05-22: 1000 ug via INTRAMUSCULAR

## 2022-05-22 NOTE — Progress Notes (Signed)
Patient presented for B 12 injection given by Camden Knotek, CMA to left deltoid, patient voiced no concerns nor showed any signs of distress during injection.  

## 2022-05-24 DIAGNOSIS — R413 Other amnesia: Secondary | ICD-10-CM | POA: Diagnosis not present

## 2022-05-24 DIAGNOSIS — R519 Headache, unspecified: Secondary | ICD-10-CM | POA: Diagnosis not present

## 2022-05-24 DIAGNOSIS — G479 Sleep disorder, unspecified: Secondary | ICD-10-CM | POA: Diagnosis not present

## 2022-05-24 DIAGNOSIS — G629 Polyneuropathy, unspecified: Secondary | ICD-10-CM | POA: Diagnosis not present

## 2022-05-29 ENCOUNTER — Ambulatory Visit (INDEPENDENT_AMBULATORY_CARE_PROVIDER_SITE_OTHER): Payer: BC Managed Care – PPO

## 2022-05-29 DIAGNOSIS — E538 Deficiency of other specified B group vitamins: Secondary | ICD-10-CM

## 2022-05-29 MED ORDER — CYANOCOBALAMIN 1000 MCG/ML IJ SOLN
1000.0000 ug | Freq: Once | INTRAMUSCULAR | Status: AC
  Administered 2022-05-29: 1000 ug via INTRAMUSCULAR

## 2022-05-29 NOTE — Progress Notes (Signed)
Patient presented for B 12 injection given by Yamaira Spinner, CMA to right deltoid, patient voiced no concerns nor showed any signs of distress during injection.  

## 2022-06-04 ENCOUNTER — Other Ambulatory Visit: Payer: Self-pay | Admitting: Physician Assistant

## 2022-06-04 DIAGNOSIS — R519 Headache, unspecified: Secondary | ICD-10-CM

## 2022-06-04 DIAGNOSIS — G629 Polyneuropathy, unspecified: Secondary | ICD-10-CM

## 2022-06-04 DIAGNOSIS — R413 Other amnesia: Secondary | ICD-10-CM

## 2022-06-05 ENCOUNTER — Ambulatory Visit (INDEPENDENT_AMBULATORY_CARE_PROVIDER_SITE_OTHER): Payer: BC Managed Care – PPO | Admitting: *Deleted

## 2022-06-05 DIAGNOSIS — E538 Deficiency of other specified B group vitamins: Secondary | ICD-10-CM | POA: Diagnosis not present

## 2022-06-05 MED ORDER — CYANOCOBALAMIN 1000 MCG/ML IJ SOLN
1000.0000 ug | Freq: Once | INTRAMUSCULAR | Status: AC
  Administered 2022-06-05: 1000 ug via INTRAMUSCULAR

## 2022-06-05 NOTE — Progress Notes (Signed)
Per orders of Dr. Einar Pheasant, in absence of Allie Bossier, injection of Vitamin B12 given in Left Deltoid by Lauralyn Primes.  Patient tolerated injection well.

## 2022-06-13 ENCOUNTER — Ambulatory Visit: Payer: BC Managed Care – PPO

## 2022-06-19 ENCOUNTER — Ambulatory Visit
Admission: RE | Admit: 2022-06-19 | Discharge: 2022-06-19 | Disposition: A | Discharge status: Home / Self Care | Payer: BC Managed Care – PPO | Source: Ambulatory Visit | Attending: Physician Assistant | Admitting: Physician Assistant

## 2022-06-19 DIAGNOSIS — R519 Headache, unspecified: Secondary | ICD-10-CM | POA: Insufficient documentation

## 2022-06-19 DIAGNOSIS — R42 Dizziness and giddiness: Secondary | ICD-10-CM | POA: Diagnosis not present

## 2022-06-19 DIAGNOSIS — R200 Anesthesia of skin: Secondary | ICD-10-CM | POA: Diagnosis not present

## 2022-06-19 DIAGNOSIS — G629 Polyneuropathy, unspecified: Secondary | ICD-10-CM | POA: Diagnosis not present

## 2022-06-19 DIAGNOSIS — R413 Other amnesia: Secondary | ICD-10-CM | POA: Insufficient documentation

## 2022-06-26 ENCOUNTER — Ambulatory Visit: Payer: BC Managed Care – PPO

## 2022-07-03 ENCOUNTER — Ambulatory Visit (INDEPENDENT_AMBULATORY_CARE_PROVIDER_SITE_OTHER): Payer: BC Managed Care – PPO

## 2022-07-03 DIAGNOSIS — E538 Deficiency of other specified B group vitamins: Secondary | ICD-10-CM | POA: Diagnosis not present

## 2022-07-03 MED ORDER — CYANOCOBALAMIN 1000 MCG/ML IJ SOLN
1000.0000 ug | Freq: Once | INTRAMUSCULAR | Status: AC
  Administered 2022-07-03: 1000 ug via INTRAMUSCULAR

## 2022-07-03 NOTE — Progress Notes (Signed)
Per orders of Allie Bossier, injection of B12 given by Loreen Freud. Patient tolerated injection well.

## 2022-07-09 DIAGNOSIS — B948 Sequelae of other specified infectious and parasitic diseases: Secondary | ICD-10-CM | POA: Diagnosis not present

## 2022-07-09 DIAGNOSIS — R5382 Chronic fatigue, unspecified: Secondary | ICD-10-CM | POA: Diagnosis not present

## 2022-07-09 DIAGNOSIS — U099 Post covid-19 condition, unspecified: Secondary | ICD-10-CM | POA: Diagnosis not present

## 2022-07-09 DIAGNOSIS — R4189 Other symptoms and signs involving cognitive functions and awareness: Secondary | ICD-10-CM | POA: Diagnosis not present

## 2022-07-10 DIAGNOSIS — G479 Sleep disorder, unspecified: Secondary | ICD-10-CM | POA: Diagnosis not present

## 2022-07-10 DIAGNOSIS — R413 Other amnesia: Secondary | ICD-10-CM | POA: Diagnosis not present

## 2022-07-10 DIAGNOSIS — G629 Polyneuropathy, unspecified: Secondary | ICD-10-CM | POA: Diagnosis not present

## 2022-07-10 DIAGNOSIS — R519 Headache, unspecified: Secondary | ICD-10-CM | POA: Diagnosis not present

## 2022-07-17 DIAGNOSIS — Z6823 Body mass index (BMI) 23.0-23.9, adult: Secondary | ICD-10-CM | POA: Diagnosis not present

## 2022-07-17 DIAGNOSIS — E782 Mixed hyperlipidemia: Secondary | ICD-10-CM | POA: Diagnosis not present

## 2022-07-17 DIAGNOSIS — E1065 Type 1 diabetes mellitus with hyperglycemia: Secondary | ICD-10-CM | POA: Diagnosis not present

## 2022-07-17 DIAGNOSIS — I10 Essential (primary) hypertension: Secondary | ICD-10-CM | POA: Diagnosis not present

## 2022-07-23 ENCOUNTER — Other Ambulatory Visit: Payer: Self-pay

## 2022-07-23 DIAGNOSIS — R351 Nocturia: Secondary | ICD-10-CM

## 2022-07-23 DIAGNOSIS — R3912 Poor urinary stream: Secondary | ICD-10-CM

## 2022-07-24 MED ORDER — TAMSULOSIN HCL 0.4 MG PO CAPS: 0.4000 mg | ORAL_CAPSULE | Freq: Every day | ORAL | 2 refills | Status: DC

## 2022-08-27 ENCOUNTER — Telehealth: Payer: Self-pay | Admitting: Primary Care

## 2022-08-27 NOTE — Telephone Encounter (Signed)
Please notify patient that I reviewed his visit with the neurologist from July 2023 and it appears that he is no longer taking Topamax for headaches. From this visit, his nortriptyline was increased to 40 mg, his meclizine was continued, and his gabapentin was continued.  Please confirm and then add nortriptyline 40 mg to medication list.

## 2022-08-27 NOTE — Telephone Encounter (Signed)
  Encourage patient to contact the pharmacy for refills or they can request refills through [facility]  Did the patient contact the pharmacy: Yes  LAST APPOINTMENT DATE: 05/10/2022  NEXT APPOINTMENT DATE: N/A  MEDICATION: topiramate (TOPAMAX) 50 MG tablet   Is the patient out of medication? Yes  PHARMACY: [facility], [place]  Let patient know to contact pharmacy at the end of the day to make sure medication is ready.  Please notify patient to allow 48-72 hours to process

## 2022-08-28 NOTE — Telephone Encounter (Signed)
Noted  

## 2022-08-28 NOTE — Telephone Encounter (Signed)
Patient notified as instructed by telephone and verbalized understanding. Patient stated that he is no longer taking Topamax. Patient confirmed that he is taking Nortriptyline 40 mg, meclizine and gabapentin. Medication list updated.

## 2022-09-05 ENCOUNTER — Other Ambulatory Visit: Payer: Self-pay | Admitting: Nurse Practitioner

## 2022-09-05 DIAGNOSIS — R112 Nausea with vomiting, unspecified: Secondary | ICD-10-CM

## 2022-09-05 MED ORDER — PROMETHAZINE HCL 12.5 MG PO TABS: ORAL_TABLET | ORAL | 0 refills | Status: DC

## 2022-09-06 ENCOUNTER — Encounter: Payer: BC Managed Care – PPO | Admitting: Primary Care

## 2022-09-12 DIAGNOSIS — G629 Polyneuropathy, unspecified: Secondary | ICD-10-CM | POA: Diagnosis not present

## 2022-09-12 DIAGNOSIS — R519 Headache, unspecified: Secondary | ICD-10-CM | POA: Diagnosis not present

## 2022-09-12 DIAGNOSIS — R413 Other amnesia: Secondary | ICD-10-CM | POA: Diagnosis not present

## 2022-09-12 DIAGNOSIS — G479 Sleep disorder, unspecified: Secondary | ICD-10-CM | POA: Diagnosis not present

## 2022-10-07 ENCOUNTER — Telehealth: Payer: Self-pay

## 2022-10-07 NOTE — Telephone Encounter (Unsigned)
Received disability form from [facility] and disability. I have called patient and verified that all forms after June are being completed by his neurologist. He does not need to have filled out by our office. I will send form to shred no further action needed by our office per patient.

## 2022-10-31 ENCOUNTER — Other Ambulatory Visit: Payer: Self-pay | Admitting: Primary Care

## 2022-10-31 DIAGNOSIS — F32A Anxiety disorder, unspecified: Secondary | ICD-10-CM

## 2023-01-08 NOTE — Telephone Encounter (Signed)
Office visit notes have been forwarded to you through [facility] charts

## 2023-04-08 ENCOUNTER — Other Ambulatory Visit: Payer: Self-pay | Admitting: Primary Care

## 2023-04-08 DIAGNOSIS — R351 Nocturia: Secondary | ICD-10-CM

## 2023-04-08 DIAGNOSIS — R3912 Poor urinary stream: Secondary | ICD-10-CM

## 2023-04-08 NOTE — Telephone Encounter (Signed)
Patient is due for CPE/follow up in late May/early June, this will be required prior to any further refills.  Please schedule, thank you!

## 2023-05-07 ENCOUNTER — Other Ambulatory Visit: Payer: Self-pay | Admitting: Primary Care

## 2023-05-07 DIAGNOSIS — I10 Essential (primary) hypertension: Secondary | ICD-10-CM

## 2023-05-07 NOTE — Telephone Encounter (Signed)
LVMTCB, sent mychart message

## 2023-05-07 NOTE — Telephone Encounter (Signed)
Patient is due for CPE/follow up in late May, this will be required prior to any further refills.  Please schedule, thank you!   

## 2023-05-28 ENCOUNTER — Encounter: Payer: Medicare Other | Admitting: Primary Care

## 2023-05-29 ENCOUNTER — Encounter: Payer: Medicare Other | Admitting: Primary Care

## 2023-05-30 ENCOUNTER — Encounter: Payer: Self-pay | Admitting: Primary Care

## 2023-06-05 ENCOUNTER — Encounter: Payer: Self-pay | Admitting: Primary Care

## 2023-06-05 ENCOUNTER — Ambulatory Visit (INDEPENDENT_AMBULATORY_CARE_PROVIDER_SITE_OTHER): Payer: Medicare Other | Admitting: Primary Care

## 2023-06-05 ENCOUNTER — Other Ambulatory Visit (HOSPITAL_COMMUNITY): Payer: Self-pay

## 2023-06-05 VITALS — BP 136/62 | HR 66 | Temp 98.0°F | Ht 70.0 in | Wt 169.0 lb

## 2023-06-05 DIAGNOSIS — Z0001 Encounter for general adult medical examination with abnormal findings: Secondary | ICD-10-CM | POA: Diagnosis not present

## 2023-06-05 DIAGNOSIS — E785 Hyperlipidemia, unspecified: Secondary | ICD-10-CM

## 2023-06-05 DIAGNOSIS — Z0000 Encounter for general adult medical examination without abnormal findings: Secondary | ICD-10-CM

## 2023-06-05 DIAGNOSIS — G629 Polyneuropathy, unspecified: Secondary | ICD-10-CM

## 2023-06-05 DIAGNOSIS — E1040 Type 1 diabetes mellitus with diabetic neuropathy, unspecified: Secondary | ICD-10-CM | POA: Diagnosis not present

## 2023-06-05 DIAGNOSIS — F419 Anxiety disorder, unspecified: Secondary | ICD-10-CM

## 2023-06-05 DIAGNOSIS — Z8616 Personal history of COVID-19: Secondary | ICD-10-CM

## 2023-06-05 DIAGNOSIS — Z23 Encounter for immunization: Secondary | ICD-10-CM

## 2023-06-05 DIAGNOSIS — I10 Essential (primary) hypertension: Secondary | ICD-10-CM

## 2023-06-05 DIAGNOSIS — R3912 Poor urinary stream: Secondary | ICD-10-CM

## 2023-06-05 DIAGNOSIS — F32A Depression, unspecified: Secondary | ICD-10-CM

## 2023-06-05 DIAGNOSIS — G4452 New daily persistent headache (NDPH): Secondary | ICD-10-CM

## 2023-06-05 DIAGNOSIS — R0789 Other chest pain: Secondary | ICD-10-CM | POA: Diagnosis not present

## 2023-06-05 DIAGNOSIS — M159 Polyosteoarthritis, unspecified: Secondary | ICD-10-CM

## 2023-06-05 DIAGNOSIS — G43009 Migraine without aura, not intractable, without status migrainosus: Secondary | ICD-10-CM

## 2023-06-05 DIAGNOSIS — R112 Nausea with vomiting, unspecified: Secondary | ICD-10-CM

## 2023-06-05 LAB — MICROALBUMIN / CREATININE URINE RATIO
Creatinine,U: 26.8 mg/dL
Microalb Creat Ratio: 2.6 mg/g (ref 0.0–30.0)
Microalb, Ur: <0.7 mg/dL (ref 0.0–1.9)

## 2023-06-05 MED ORDER — METHOCARBAMOL 500 MG PO TABS: 500.0000 mg | ORAL_TABLET | Freq: Three times a day (TID) | ORAL | 0 refills | Status: DC | PRN

## 2023-06-05 MED ORDER — GABAPENTIN 800 MG PO TABS: ORAL_TABLET | ORAL | 3 refills | Status: DC

## 2023-06-05 MED ORDER — LISINOPRIL 30 MG PO TABS: 30.0000 mg | ORAL_TABLET | Freq: Every day | ORAL | 3 refills | Status: DC

## 2023-06-05 MED ORDER — PROMETHAZINE HCL 12.5 MG PO TABS: ORAL_TABLET | ORAL | 0 refills | Status: DC

## 2023-06-05 NOTE — Assessment & Plan Note (Signed)
Continued with daily headaches, although the intensity has improved. Following with neurology. He declines medication treatment.

## 2023-06-05 NOTE — Assessment & Plan Note (Signed)
Controlled.  Continue lisinopril 30 mg daily. Reviewed BMP from February 2024 through care everywhere

## 2023-06-05 NOTE — Assessment & Plan Note (Signed)
Controlled with A1C of 7.3 in February 2024. Reviewed labs from Care Everywhere.  Continue use of insulin pump. Following with endocrinology
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
## 2023-06-05 NOTE — Patient Instructions (Signed)
Stop by the lab prior to leaving today. I will notify you of your results once received.   You will either be contacted via phone regarding your referral to cardiology, or you may receive a letter on your MyChart portal from our referral team with instructions for scheduling an appointment. Please let us know if you have not been contacted by anyone within two weeks.  It was a pleasure to see you today!  

## 2023-06-05 NOTE — Assessment & Plan Note (Signed)
Following with Covid-19 clinic.

## 2023-06-05 NOTE — Assessment & Plan Note (Signed)
Controlled.  Continue duloxetine 40 mg daily.

## 2023-06-05 NOTE — Assessment & Plan Note (Signed)
Chronic and ongoing, now with new features.  Given family history, coupled with changes, will refer to cardiology. He agrees.  Referral placed.

## 2023-06-05 NOTE — Assessment & Plan Note (Addendum)
Pneumovax 20 provided today. Colonoscopy UTD, due 2029 PSA due and pending.  Discussed the importance of a healthy diet and regular exercise in order for weight loss, and to reduce the risk of further co-morbidity.  Exam stable. Labs pending and also reviewed from endocrinology through care everywhere.  Follow up in 1 year for repeat physical.

## 2023-06-05 NOTE — Assessment & Plan Note (Signed)
Controlled.  Continue tamsulosin 0.4 mg daily. 

## 2023-06-05 NOTE — Assessment & Plan Note (Signed)
Reviewed lipid panel from February 2024. Continue simvastatin 40 mg daily.

## 2023-06-05 NOTE — Assessment & Plan Note (Signed)
Intermittent.  Continue promethazine 12.5 mg PRN Following with neurology.

## 2023-06-05 NOTE — Assessment & Plan Note (Signed)
Controlled with infrequent migraines.  Continue rizatriptan 5 mg PRN.

## 2023-06-05 NOTE — Assessment & Plan Note (Signed)
Controlled.  Continue gabapentin 800 mg TID, methocarbamol 500 mg PRN. Continue to monitor.

## 2023-06-12 ENCOUNTER — Other Ambulatory Visit (HOSPITAL_COMMUNITY): Payer: Self-pay

## 2023-06-12 ENCOUNTER — Telehealth: Payer: Self-pay

## 2023-06-12 NOTE — Telephone Encounter (Signed)
Patient Advocate Encounter  Prior Authorization for Promethazine 12.5mg  tabs has been approved with [employer].    PA# [id number] Effective dates: 05/12/23 through 06/10/24  Per [facility] test claim, copay for 7 days supply is $1.05  Approval letter indexed to chart

## 2023-06-12 NOTE — Telephone Encounter (Signed)
Patient Advocate Encounter  Prior Authorization for Methocarbamol 500mg   has been approved with [employer].    PA# [id number] Effective dates: 05/12/23 through 06/10/2024  Per [facility] test claim, copay for 30 days supply is $3.61   Approval letter indexed to chart

## 2023-07-07 ENCOUNTER — Other Ambulatory Visit: Payer: Self-pay | Admitting: Physician Assistant

## 2023-07-07 ENCOUNTER — Other Ambulatory Visit: Payer: Self-pay | Admitting: Primary Care

## 2023-07-07 DIAGNOSIS — R351 Nocturia: Secondary | ICD-10-CM

## 2023-07-07 DIAGNOSIS — R3912 Poor urinary stream: Secondary | ICD-10-CM

## 2023-07-07 DIAGNOSIS — M545 Low back pain, unspecified: Secondary | ICD-10-CM

## 2023-07-12 ENCOUNTER — Ambulatory Visit
Admission: RE | Admit: 2023-07-12 | Discharge: 2023-07-12 | Disposition: A | Discharge status: Home / Self Care | Payer: Medicare Other | Source: Ambulatory Visit | Attending: Physician Assistant | Admitting: Physician Assistant

## 2023-07-12 DIAGNOSIS — M545 Low back pain, unspecified: Secondary | ICD-10-CM | POA: Insufficient documentation

## 2023-07-12 DIAGNOSIS — G8929 Other chronic pain: Secondary | ICD-10-CM | POA: Insufficient documentation

## 2023-07-26 ENCOUNTER — Other Ambulatory Visit: Payer: Self-pay | Admitting: Primary Care

## 2023-07-26 DIAGNOSIS — R112 Nausea with vomiting, unspecified: Secondary | ICD-10-CM

## 2023-07-28 LAB — HEMOGLOBIN A1C: Hemoglobin A1C: 7.4

## 2023-08-04 DIAGNOSIS — E785 Hyperlipidemia, unspecified: Secondary | ICD-10-CM

## 2023-08-05 MED ORDER — SIMVASTATIN 40 MG PO TABS: 40.0000 mg | ORAL_TABLET | Freq: Every evening | ORAL | 2 refills | Status: DC

## 2023-08-12 ENCOUNTER — Ambulatory Visit: Payer: Medicare Other | Admitting: Cardiology

## 2023-08-12 ENCOUNTER — Encounter: Payer: Self-pay | Admitting: Cardiology

## 2023-08-12 VITALS — BP 170/72 | HR 65 | Ht 70.0 in | Wt 164.2 lb

## 2023-08-12 DIAGNOSIS — I10 Essential (primary) hypertension: Secondary | ICD-10-CM | POA: Diagnosis present

## 2023-08-12 DIAGNOSIS — R072 Precordial pain: Secondary | ICD-10-CM | POA: Insufficient documentation

## 2023-08-12 DIAGNOSIS — E7800 Pure hypercholesterolemia, unspecified: Secondary | ICD-10-CM | POA: Insufficient documentation

## 2023-08-12 MED ORDER — METOPROLOL TARTRATE 100 MG PO TABS: 100.0000 mg | ORAL_TABLET | Freq: Once | ORAL | 0 refills | Status: DC
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
## 2023-08-12 NOTE — Patient Instructions (Signed)
Medication Instructions:   Your physician recommends that you continue on your current medications as directed. Please refer to the Current Medication list given to you today.  *If you need a refill on your cardiac medications before your next appointment, please call your pharmacy*   Lab Work:  Your physician recommends you have lab work -- BMP   If you have labs (blood work) drawn today and your tests are completely normal, you will receive your results only by: MyChart Message (if you have MyChart) OR A paper copy in the mail If you have any lab test that is abnormal or we need to change your treatment, we will call you to review the results.   Testing/Procedures:  Your physician has requested that you have an echocardiogram. Echocardiography is a painless test that uses sound waves to create images of your heart. It provides your doctor with information about the size and shape of your heart and how well your heart's chambers and valves are working. This procedure takes approximately one hour. There are no restrictions for this procedure. Please do NOT wear cologne, perfume, aftershave, or lotions (deodorant is allowed). Please arrive 15 minutes prior to your appointment time.    Your cardiac CT will be scheduled at one of the below locations:   [facility] [address] [place], [place] [address] [phone]  OR   [facility] Heart and Vascular entrance [address] [place], [place] [address] [phone]  Please follow these instructions carefully (unless otherwise directed):  An IV will be required for this test and Nitroglycerin will be given.  Hold all erectile dysfunction medications at least 3 days (72 hrs) prior to test. (Ie viagra, cialis, sildenafil, tadalafil, etc)   On the Night Before the Test: Be sure to Drink plenty of water. Do not consume any caffeinated/decaffeinated beverages or chocolate 12 hours prior  to your test. Do not take any antihistamines 12 hours prior to your test.  On the Day of the Test: Drink plenty of water until 1 hour prior to the test. Do not eat any food 1 hour prior to test. You may take your regular medications prior to the test.  Take metoprolol (Lopressor) two hours prior to test. If you take Furosemide/Hydrochlorothiazide/Spironolactone, please HOLD on the morning of the test.      After the Test: Drink plenty of water. After receiving IV contrast, you may experience a mild flushed feeling. This is normal. On occasion, you may experience a mild rash up to 24 hours after the test. This is not dangerous. If this occurs, you can take Benadryl 25 mg and increase your fluid intake. If you experience trouble breathing, this can be serious. If it is severe call 911 IMMEDIATELY. If it is mild, please call our office. If you take any of these medications: Glipizide/Metformin, Avandament, Glucavance, please do not take 48 hours after completing test unless otherwise instructed.  We will call to schedule your test 2-4 weeks out understanding that some insurance companies will need an authorization prior to the service being performed.   For more information and frequently asked questions, please visit our website : [email or web address]  For non-scheduling related questions, please contact the cardiac imaging nurse navigator should you have any questions/concerns: Cardiac Imaging Nurse Navigators Direct Office Dial: [phone]   For scheduling needs, including cancellations and rescheduling, please call [place], [phone].   Follow-Up: At [facility], you and your health needs are our priority.  As  part of our continuing mission to provide you with exceptional heart care, we have created designated Provider Care Teams.  These Care Teams include your primary Cardiologist (physician) and Advanced Practice Providers (APPs -  Physician Assistants and  Nurse Practitioners) who all work together to provide you with the care you need, when you need it.  We recommend signing up for the patient portal called "MyChart".  Sign up information is provided on this After Visit Summary.  MyChart is used to connect with patients for Virtual Visits (Telemedicine).  Patients are able to view lab/test results, encounter notes, upcoming appointments, etc.  Non-urgent messages can be sent to your provider as well.   To learn more about what you can do with MyChart, go to [email or web address].    Your next appointment:    After cardiac testing  Provider:   You may see Debbe Odea, MD or one of the following Advanced Practice Providers on your designated Care Team:   Nicolasa Ducking, NP Eula Listen, PA-C Cadence Fransico Michael, PA-C Charlsie Quest, NP

## 2023-08-13 LAB — BASIC METABOLIC PANEL
BUN/Creatinine Ratio: 10 (ref 10–24)
BUN: 8 mg/dL (ref 8–27)
CO2: 24 mmol/L (ref 20–29)
Calcium: 8.9 mg/dL (ref 8.6–10.2)
Chloride: 97 mmol/L (ref 96–106)
Creatinine, Ser: 0.77 mg/dL (ref 0.76–1.27)
Glucose: 221 mg/dL — ABNORMAL HIGH (ref 70–99)
Potassium: 4.4 mmol/L (ref 3.5–5.2)
Sodium: 134 mmol/L (ref 134–144)
eGFR: 99 mL/min/{1.73_m2} (ref >59)

## 2023-08-15 ENCOUNTER — Telehealth (HOSPITAL_COMMUNITY): Payer: Self-pay | Admitting: Emergency Medicine

## 2023-08-15 NOTE — Telephone Encounter (Signed)
Attempted to call patient regarding upcoming cardiac CT appointment. °Left message on voicemail with name and callback number °Sara Wallace RN Navigator Cardiac Imaging °[facility] °[phone] Office °[phone] Cell ° °

## 2023-08-19 ENCOUNTER — Telehealth (HOSPITAL_COMMUNITY): Payer: Self-pay | Admitting: *Deleted

## 2023-08-19 NOTE — Telephone Encounter (Signed)
Patient returning call about his upcoming cardiac imaging study; pt verbalizes understanding of appt date/time, parking situation and where to check in, pre-test NPO status and medications ordered, and verified current allergies; name and call back number provided for further questions should they arise  Merle Prescott RN Navigator Cardiac Imaging [facility] [phone] office [phone] cell  Patient to take 100mg metoprolol tartrate two hours prior to his cardiac CT scan.  

## 2023-08-20 ENCOUNTER — Ambulatory Visit
Admission: RE | Admit: 2023-08-20 | Discharge: 2023-08-20 | Disposition: A | Discharge status: Home / Self Care | Payer: Medicare Other | Source: Ambulatory Visit | Attending: Cardiology | Admitting: Cardiology

## 2023-08-20 DIAGNOSIS — R072 Precordial pain: Secondary | ICD-10-CM | POA: Insufficient documentation

## 2023-08-20 MED ORDER — NITROGLYCERIN 0.4 MG SL SUBL
0.8000 mg | SUBLINGUAL_TABLET | Freq: Once | SUBLINGUAL | Status: AC
  Administered 2023-08-20: 0.8 mg via SUBLINGUAL
  Filled 2023-08-20: qty 25

## 2023-08-20 MED ORDER — IOHEXOL 350 MG/ML SOLN
100.0000 mL | Freq: Once | INTRAVENOUS | Status: AC | PRN
Start: 2023-08-20 — End: 2023-08-20
  Administered 2023-08-20: 100 mL via INTRAVENOUS

## 2023-08-20 NOTE — Progress Notes (Signed)

## 2023-08-22 ENCOUNTER — Other Ambulatory Visit: Payer: Self-pay

## 2023-08-22 MED ORDER — ATORVASTATIN CALCIUM 40 MG PO TABS: 40.0000 mg | ORAL_TABLET | Freq: Every day | ORAL | 3 refills | Status: DC

## 2023-08-22 MED ORDER — ASPIRIN 81 MG PO TBEC: 81.0000 mg | DELAYED_RELEASE_TABLET | Freq: Every day | ORAL | Status: AC

## 2023-08-23 ENCOUNTER — Encounter: Payer: Self-pay | Admitting: Cardiology

## 2023-08-25 ENCOUNTER — Telehealth: Payer: Self-pay

## 2023-08-25 NOTE — Telephone Encounter (Signed)
The patient had also sent a MyChart message regarding his CT scan results and symptoms.  A copy of the MyChart message was forwarded to Dr. Azucena Cecil to review and also some clarification regarding his CT results were sent to the patient via MyChart.   Awaiting any further MD recommendations.

## 2023-08-25 NOTE — Telephone Encounter (Signed)
Called patient to review CT Scan results with patient.    Debbe Odea, MD 08/21/2023  5:12 PM EDT     Coronary CT shows mild nonobstructive CAD.  Start aspirin 81 mg daily, stop simvastatin, start atorvastatin 40 mg.   Patient reports that he has been having chest pain this AM. Very weak and has a headache that is at the front of his head.   Recent vital - 179 / 84 HR 84   Patient reports that he has already been told by someone else that he needs to go to the ER and he stated he will not be going unless someone can convince him that he is going to die from this.

## 2023-08-27 ENCOUNTER — Other Ambulatory Visit: Payer: Self-pay | Admitting: Primary Care

## 2023-08-27 ENCOUNTER — Other Ambulatory Visit: Payer: Self-pay

## 2023-08-27 ENCOUNTER — Emergency Department: Payer: Medicare Other

## 2023-08-27 ENCOUNTER — Emergency Department
Admission: EM | Admit: 2023-08-27 | Discharge: 2023-08-27 | Disposition: A | Discharge status: Home / Self Care | Payer: Medicare Other | Attending: Student in an Organized Health Care Education/Training Program | Admitting: Student in an Organized Health Care Education/Training Program

## 2023-08-27 DIAGNOSIS — Z1152 Encounter for screening for COVID-19: Secondary | ICD-10-CM | POA: Diagnosis not present

## 2023-08-27 DIAGNOSIS — E860 Dehydration: Secondary | ICD-10-CM | POA: Diagnosis not present

## 2023-08-27 DIAGNOSIS — R079 Chest pain, unspecified: Secondary | ICD-10-CM | POA: Insufficient documentation

## 2023-08-27 DIAGNOSIS — R55 Syncope and collapse: Secondary | ICD-10-CM | POA: Diagnosis present

## 2023-08-27 DIAGNOSIS — I2510 Atherosclerotic heart disease of native coronary artery without angina pectoris: Secondary | ICD-10-CM | POA: Diagnosis not present

## 2023-08-27 DIAGNOSIS — M159 Polyosteoarthritis, unspecified: Secondary | ICD-10-CM

## 2023-08-27 LAB — SARS CORONAVIRUS 2 BY RT PCR: SARS Coronavirus 2 by RT PCR: NEGATIVE

## 2023-08-27 LAB — BASIC METABOLIC PANEL
Anion gap: 11 (ref 5–15)
BUN: 13 mg/dL (ref 8–23)
CO2: 22 mmol/L (ref 22–32)
Calcium: 8.6 mg/dL — ABNORMAL LOW (ref 8.9–10.3)
Chloride: 99 mmol/L (ref 98–111)
Creatinine, Ser: 1.03 mg/dL (ref 0.61–1.24)
GFR, Estimated: >60 mL/min (ref >60)
Glucose, Bld: 215 mg/dL — ABNORMAL HIGH (ref 70–99)
Potassium: 4.1 mmol/L (ref 3.5–5.1)
Sodium: 132 mmol/L — ABNORMAL LOW (ref 135–145)

## 2023-08-27 LAB — CBC
HCT: 36.8 % — ABNORMAL LOW (ref 39.0–52.0)
Hemoglobin: 12.2 g/dL — ABNORMAL LOW (ref 13.0–17.0)
MCH: 31 pg (ref 26.0–34.0)
MCHC: 33.2 g/dL (ref 30.0–36.0)
MCV: 93.6 fL (ref 80.0–100.0)
Platelets: 275 10*3/uL (ref 150–400)
RBC: 3.93 MIL/uL — ABNORMAL LOW (ref 4.22–5.81)
RDW: 12.6 % (ref 11.5–15.5)
WBC: 8.7 10*3/uL (ref 4.0–10.5)
nRBC: 0 % (ref 0.0–0.2)

## 2023-08-27 LAB — TROPONIN I (HIGH SENSITIVITY)
Troponin I (High Sensitivity): 3 ng/L (ref <18)
Troponin I (High Sensitivity): 3 ng/L (ref <18)

## 2023-08-27 MED ORDER — ACETAMINOPHEN 500 MG PO TABS
1000.0000 mg | ORAL_TABLET | Freq: Once | ORAL | Status: AC
  Administered 2023-08-27: 1000 mg via ORAL
  Filled 2023-08-27: qty 2

## 2023-08-27 MED ORDER — IOHEXOL 350 MG/ML SOLN
75.0000 mL | Freq: Once | INTRAVENOUS | Status: AC | PRN
  Administered 2023-08-27: 75 mL via INTRAVENOUS

## 2023-08-27 MED ORDER — SODIUM CHLORIDE 0.9 % IV BOLUS
1000.0000 mL | Freq: Once | INTRAVENOUS | Status: AC
  Administered 2023-08-27: 1000 mL via INTRAVENOUS

## 2023-08-27 NOTE — ED Triage Notes (Signed)
[place] sync[place] while playing golf. Reports some chest discomfort, nausea.  Thinks might have hit head on grass. Takes ASA +h/a Diabetic, cbg 168

## 2023-08-27 NOTE — ED Provider Notes (Signed)
[facility] Provider Note    Event Date/Time   First MD Initiated Contact with Patient 08/27/23 1455     (approximate)   History   Chest Pain and Loss of Consciousness   HPI  AYDRIAN KIRKBRIDE is a 67 y.o. male with a history of CAD presents to the ER for evaluation of syncopal episode that occurred today while he was out walking a golf course with friends.  States has been dealing with some lightheadedness for quite some time but today started having some palpitations chest tightness shortness of breath felt clammy and that is about to pass out did fall backwards passed out for few seconds.  No report of any seizure-like activity.  Patient feels very worn down.  He is not on any blood thinners.  Does still have some chest tightness.  Feels very fatigued.     Physical Exam   Triage Vital Signs: ED Triage Vitals  Encounter Vitals Group     BP 08/27/23 1339 (!) 155/73     Systolic BP Percentile --      Diastolic BP Percentile --      Pulse Rate 08/27/23 1339 94     Resp 08/27/23 1339 20     Temp 08/27/23 1339 98.8 F (37.1 C)     Temp src --      SpO2 08/27/23 1339 98 %     Weight 08/27/23 1340 168 lb (76.2 kg)     Height 08/27/23 1340 5\' 10"  (1.778 m)     Head Circumference --      Peak Flow --      Pain Score 08/27/23 1339 7     Pain Loc --      Pain Education --      Exclude from Growth Chart --     Most recent vital signs: Vitals:   08/27/23 1339 08/27/23 1600  BP: (!) 155/73 135/74  Pulse: 94 62  Resp: 20 11  Temp: 98.8 F (37.1 C)   SpO2: 98% 97%     Constitutional: Alert  Eyes: Conjunctivae are normal.  Head: Atraumatic. Nose: No congestion/rhinnorhea. Mouth/Throat: Mucous membranes are moist.   Neck: Painless ROM.  Cardiovascular:   Good peripheral circulation. Respiratory: Normal respiratory effort.  No retractions.  Gastrointestinal: Soft and nontender.  Musculoskeletal:  no deformity Neurologic:  MAE spontaneously. No  gross focal neurologic deficits are appreciated.  Skin:  Skin is warm, dry and intact. No rash noted. Psychiatric: Mood and affect are normal. Speech and behavior are normal.    ED Results / Procedures / Treatments   Labs (all labs ordered are listed, but only abnormal results are displayed) Labs Reviewed  BASIC METABOLIC PANEL - Abnormal; Notable for the following components:      Result Value   Sodium 132 (*)    Glucose, Bld 215 (*)    Calcium 8.6 (*)    All other components within normal limits  CBC - Abnormal; Notable for the following components:   RBC 3.93 (*)    Hemoglobin 12.2 (*)    HCT 36.8 (*)    All other components within normal limits  SARS CORONAVIRUS 2 BY RT PCR  TROPONIN I (HIGH SENSITIVITY)  TROPONIN I (HIGH SENSITIVITY)     EKG  ED ECG REPORT I, Willy Eddy, the attending physician, personally viewed and interpreted this ECG.   Date: 08/27/2023  EKG Time: 13:37  Rate: 90  Rhythm: sinus  Axis: normal  Intervals:normal  ST&T  Change:  no stemi    RADIOLOGY Please see ED Course for my review and interpretation.  I personally reviewed all radiographic images ordered to evaluate for the above acute complaints and reviewed radiology reports and findings.  These findings were personally discussed with the patient.  Please see medical record for radiology report.    PROCEDURES:  Critical Care performed: No  Procedures   MEDICATIONS ORDERED IN ED: Medications  sodium chloride 0.9 % bolus 1,000 mL (0 mLs Intravenous Stopped 08/27/23 1723)  acetaminophen (TYLENOL) tablet 1,000 mg (1,000 mg Oral Given 08/27/23 1534)  iohexol (OMNIPAQUE) 350 MG/ML injection 75 mL (75 mLs Intravenous Contrast Given 08/27/23 1541)     IMPRESSION / MDM / ASSESSMENT AND PLAN / ED COURSE  I reviewed the triage vital signs and the nursing notes.                              Differential diagnosis includes, but is not limited to, ACS, pericarditis, esophagitis,  boerhaaves, pe, dissection, pna, bronchitis, costochondritis, sdh, iph, hypoglycemia  Patient presenting to the ER for evaluation of symptoms as described above.  Based on symptoms, risk factors and considered above differential, this presenting complaint could reflect a potentially life-threatening illness therefore the patient will be placed on continuous pulse oximetry and telemetry for monitoring.  Laboratory evaluation will be sent to evaluate for the above complaints.      Clinical Course as of 08/27/23 1727  Wed Aug 27, 2023  1641 Chest x-ray on my review and interpretation without evidence of consolidation or pneumothorax. [PR]  1712 Patient reassessed and feeling improved after IV hydration.  CTA without evidence of PE.  He is not having any symptoms of aspiration or pneumonia. [PR]  1713 We discussed recommendation for observation in the hospital but patient feeling improved and does not want to be admitted at this time.  Will continue to observe. [PR]  1725 Patient tolerating p.o.  He is requesting discharge home.  Given reassuring workup and close outpatient follow-up I think that is reasonable.  Patient was encouraged to increase p.o. intake fluids and return to the ER if he has any additional concerns or new symptoms. [PR]    Clinical Course User Index [PR] Willy Eddy, MD     FINAL CLINICAL IMPRESSION(S) / ED DIAGNOSES   Final diagnoses:  Fainting spell  Dehydration     Rx / DC Orders   ED Discharge Orders     None        Note:  This document was prepared using Dragon voice recognition software and may include unintentional dictation errors.    Willy Eddy, MD 08/27/23 [phone]

## 2023-08-27 NOTE — ED Notes (Signed)
Pt states he was recently Rx'd meds for chest pain on 08/25/2023 from his cardiologist. He states the cardiologist is trying to find out what's wrong with his heart. States he's always had stable angina. Hx of doing heart caths.

## 2023-09-02 ENCOUNTER — Other Ambulatory Visit: Payer: Self-pay | Admitting: Physician Assistant

## 2023-09-02 ENCOUNTER — Other Ambulatory Visit: Payer: Self-pay

## 2023-09-02 DIAGNOSIS — R202 Paresthesia of skin: Secondary | ICD-10-CM

## 2023-09-02 DIAGNOSIS — M542 Cervicalgia: Secondary | ICD-10-CM

## 2023-09-02 MED ORDER — ATORVASTATIN CALCIUM 40 MG PO TABS: 40.0000 mg | ORAL_TABLET | Freq: Every day | ORAL | 3 refills | Status: DC

## 2023-09-02 NOTE — Telephone Encounter (Signed)
Requested Prescriptions   Signed Prescriptions Disp Refills   atorvastatin (LIPITOR) 40 MG tablet 90 tablet 3    Sig: Take 1 tablet (40 mg total) by mouth daily.    Authorizing Provider: Debbe Odea    Ordering User: Guerry Minors

## 2023-09-02 NOTE — Telephone Encounter (Signed)
Request to send Atorvastatin rx to mail order pharmacy [facility].

## 2023-09-04 ENCOUNTER — Ambulatory Visit: Payer: Medicare Other | Attending: Cardiology

## 2023-09-04 DIAGNOSIS — R072 Precordial pain: Secondary | ICD-10-CM

## 2023-09-04 LAB — ECHOCARDIOGRAM COMPLETE
Area-P 1/2: 3.03 cm2
S' Lateral: 2.85 cm

## 2023-09-08 ENCOUNTER — Ambulatory Visit: Payer: Medicare Other

## 2023-09-09 ENCOUNTER — Other Ambulatory Visit: Payer: Self-pay | Admitting: Primary Care

## 2023-09-09 DIAGNOSIS — G629 Polyneuropathy, unspecified: Secondary | ICD-10-CM

## 2023-09-10 ENCOUNTER — Other Ambulatory Visit: Payer: Self-pay

## 2023-09-16 ENCOUNTER — Other Ambulatory Visit: Payer: Self-pay

## 2023-09-18 ENCOUNTER — Ambulatory Visit
Admission: RE | Admit: 2023-09-18 | Discharge: 2023-09-18 | Disposition: A | Discharge status: Home / Self Care | Payer: Medicare Other | Source: Ambulatory Visit | Attending: Physician Assistant | Admitting: Physician Assistant

## 2023-09-18 ENCOUNTER — Telehealth: Payer: Self-pay | Admitting: Primary Care

## 2023-09-18 DIAGNOSIS — M542 Cervicalgia: Secondary | ICD-10-CM | POA: Diagnosis present

## 2023-09-18 DIAGNOSIS — R200 Anesthesia of skin: Secondary | ICD-10-CM | POA: Insufficient documentation

## 2023-09-18 DIAGNOSIS — R202 Paresthesia of skin: Secondary | ICD-10-CM | POA: Diagnosis present

## 2023-09-18 NOTE — Telephone Encounter (Signed)
Martin Deleon from [employer] called over and they are wanting to setup a peer to peer with Mayra Reel regarding the patients disability claim. Reference number is [id number]. Thank you!

## 2023-09-18 NOTE — Telephone Encounter (Signed)
I have not placed the patient on disability.  It looks like his neurologist may have but not sure.  He is seeing Nilda Calamity, PA through [facility] neurology.

## 2023-09-18 NOTE — Telephone Encounter (Signed)
Called and left detailed message with [employer]. Advising Martin Deleon is not provider who put patient on disability.

## 2023-09-29 ENCOUNTER — Ambulatory Visit: Payer: Medicare Other | Attending: Cardiology | Admitting: Cardiology

## 2023-09-29 ENCOUNTER — Encounter: Payer: Self-pay | Admitting: Cardiology

## 2023-09-29 VITALS — BP 144/74 | HR 75 | Ht 70.0 in | Wt 160.8 lb

## 2023-09-29 DIAGNOSIS — E7800 Pure hypercholesterolemia, unspecified: Secondary | ICD-10-CM | POA: Diagnosis not present

## 2023-09-29 DIAGNOSIS — I10 Essential (primary) hypertension: Secondary | ICD-10-CM | POA: Diagnosis not present

## 2023-09-29 DIAGNOSIS — I2510 Atherosclerotic heart disease of native coronary artery without angina pectoris: Secondary | ICD-10-CM | POA: Insufficient documentation

## 2023-09-29 MED ORDER — ISOSORBIDE MONONITRATE ER 30 MG PO TB24
15.0000 mg | ORAL_TABLET | Freq: Every day | ORAL | 0 refills | Status: DC
Start: 2023-09-29 — End: 2023-12-29

## 2023-09-29 NOTE — Patient Instructions (Signed)
Medication Instructions:   START Imdur - Take half tablet (15mg ) by mouth daily.   *If you need a refill on your cardiac medications before your next appointment, please call your pharmacy*   Lab Work:  None Ordered  If you have labs (blood work) drawn today and your tests are completely normal, you will receive your results only by: MyChart Message (if you have MyChart) OR A paper copy in the mail If you have any lab test that is abnormal or we need to change your treatment, we will call you to review the results.   Testing/Procedures:  None Ordered   Follow-Up: At [facility], you and your health needs are our priority.  As part of our continuing mission to provide you with exceptional heart care, we have created designated Provider Care Teams.  These Care Teams include your primary Cardiologist (physician) and Advanced Practice Providers (APPs -  Physician Assistants and Nurse Practitioners) who all work together to provide you with the care you need, when you need it.  We recommend signing up for the patient portal called "MyChart".  Sign up information is provided on this After Visit Summary.  MyChart is used to connect with patients for Virtual Visits (Telemedicine).  Patients are able to view lab/test results, encounter notes, upcoming appointments, etc.  Non-urgent messages can be sent to your provider as well.   To learn more about what you can do with MyChart, go to [email or web address].    Your next appointment:   2 month(s)  Provider:   You may see Debbe Odea, MD or one of the following Advanced Practice Providers on your designated Care Team:   Nicolasa Ducking, NP Eula Listen, PA-C Cadence Fransico Michael, PA-C Charlsie Quest, NP
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
## 2023-10-01 ENCOUNTER — Ambulatory Visit: Payer: Medicare Other | Attending: Otolaryngology

## 2023-10-01 DIAGNOSIS — G4736 Sleep related hypoventilation in conditions classified elsewhere: Secondary | ICD-10-CM | POA: Insufficient documentation

## 2023-10-01 DIAGNOSIS — G4733 Obstructive sleep apnea (adult) (pediatric): Secondary | ICD-10-CM | POA: Diagnosis present

## 2023-10-03 DIAGNOSIS — R351 Nocturia: Secondary | ICD-10-CM

## 2023-10-03 DIAGNOSIS — R3912 Poor urinary stream: Secondary | ICD-10-CM

## 2023-10-03 MED ORDER — TAMSULOSIN HCL 0.4 MG PO CAPS
0.4000 mg | ORAL_CAPSULE | Freq: Every day | ORAL | 1 refills | Status: DC
Start: 2023-10-03 — End: 2024-03-28

## 2023-12-03 ENCOUNTER — Ambulatory Visit: Payer: Medicare Other | Admitting: Nurse Practitioner

## 2023-12-22 ENCOUNTER — Other Ambulatory Visit: Payer: Self-pay | Admitting: Primary Care

## 2023-12-22 DIAGNOSIS — M150 Primary generalized (osteo)arthritis: Secondary | ICD-10-CM

## 2023-12-29 ENCOUNTER — Other Ambulatory Visit: Payer: Self-pay

## 2023-12-29 MED ORDER — ISOSORBIDE MONONITRATE ER 30 MG PO TB24: 15.0000 mg | ORAL_TABLET | Freq: Every day | ORAL | 0 refills | Status: DC

## 2023-12-29 NOTE — Telephone Encounter (Signed)
 Requested Prescriptions   Signed Prescriptions Disp Refills   isosorbide  mononitrate (IMDUR ) 30 MG 24 hr tablet 45 tablet 0    Sig: Take 0.5 tablets (15 mg total) by mouth daily.    Authorizing Provider: DARLISS ROGUE    Ordering User: CLAUDENE POWELL CROME    Last office visit: 09/29/23 with plan to f/u 2 months. next office visit: 12/31/23

## 2023-12-31 ENCOUNTER — Ambulatory Visit: Payer: Medicare Other | Attending: Nurse Practitioner | Admitting: Nurse Practitioner

## 2023-12-31 ENCOUNTER — Encounter: Payer: Self-pay | Admitting: Nurse Practitioner

## 2023-12-31 VITALS — BP 162/72 | HR 66 | Ht 70.5 in | Wt 160.6 lb

## 2023-12-31 DIAGNOSIS — E7800 Pure hypercholesterolemia, unspecified: Secondary | ICD-10-CM | POA: Insufficient documentation

## 2023-12-31 DIAGNOSIS — E1040 Type 1 diabetes mellitus with diabetic neuropathy, unspecified: Secondary | ICD-10-CM | POA: Diagnosis present

## 2023-12-31 DIAGNOSIS — I2510 Atherosclerotic heart disease of native coronary artery without angina pectoris: Secondary | ICD-10-CM | POA: Insufficient documentation

## 2023-12-31 DIAGNOSIS — I10 Essential (primary) hypertension: Secondary | ICD-10-CM | POA: Insufficient documentation

## 2023-12-31 MED ORDER — LISINOPRIL 40 MG PO TABS: 40.0000 mg | ORAL_TABLET | Freq: Every day | ORAL | 3 refills | Status: DC
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
## 2023-12-31 NOTE — Patient Instructions (Signed)
 Medication Instructions:  Your physician recommends the following medication changes.  INCREASE: Lisinopril  to 40 mg by mouth daily   *If you need a refill on your cardiac medications before your next appointment, please call your pharmacy*   Lab Work: Your provider would like for you to return in 1 week to have the following labs drawn: (BMP).   Please go to [facility] [address] ([facility], [place] [address] You do not need an appointment.  They are open from 8 am- 4:30 pm.  Lunch from 1:00 pm- 2:00 pm You will need to be fasting.   You may also go to one of the following [facility]:  [facility] at [employer] [id number] [facility] [address], [address] [place], [place] [address], [place] , [place] [address] Phone: [phone]: [phone]: [phone] Lab hours: Mon-Fri 8 am- 5 pm Lab Hours: Mon-Fri 8 am- 5 pm   Lab Hours: Mon-Fri 8 am- 5 pm Lunch 12 pm- 1 pmLunch 12 pm- 1 pmLunch: 12 pm- 1 pm      Testing/Procedures: No test ordered today    Follow-Up: At [facility], you and your health needs are our priority.  As part of our continuing mission to provide you with exceptional heart care, we have created designated Provider Care Teams.  These Care Teams include your primary Cardiologist (physician) and Advanced Practice Providers (APPs -  Physician Assistants and Nurse Practitioners) who all work together to provide you with the care you need, when you need it.  We recommend signing up for the patient portal called "MyChart".  Sign up information is provided on this After Visit Summary.  MyChart is used to connect with patients for Virtual Visits (Telemedicine).  Patients are able to view lab/test results, encounter notes, upcoming appointments, etc.  Non-urgent messages can be sent to your provider as well.   To learn more about what you can do with  MyChart, go to [email or web address].    Your next appointment:   6 month(s)  Provider:   You may see Constancia Delton, MD or one of the following Advanced Practice Providers on your designated Care Team:   Laneta Pintos, NP Varney Gentleman, PA-C Cadence Gennaro Khat, PA-C Greene Cockayne, NP Morey Ar, NP

## 2024-01-02 ENCOUNTER — Encounter: Payer: Self-pay | Admitting: Nurse Practitioner

## 2024-01-07 ENCOUNTER — Ambulatory Visit: Payer: Medicare Other | Admitting: Primary Care

## 2024-01-07 ENCOUNTER — Encounter: Payer: Self-pay | Admitting: Primary Care

## 2024-01-07 VITALS — BP 130/62 | HR 74 | Temp 97.4°F | Ht 70.5 in | Wt 165.0 lb

## 2024-01-07 DIAGNOSIS — R112 Nausea with vomiting, unspecified: Secondary | ICD-10-CM | POA: Diagnosis not present

## 2024-01-07 DIAGNOSIS — F32A Depression, unspecified: Secondary | ICD-10-CM

## 2024-01-07 DIAGNOSIS — M150 Primary generalized (osteo)arthritis: Secondary | ICD-10-CM

## 2024-01-07 DIAGNOSIS — G43009 Migraine without aura, not intractable, without status migrainosus: Secondary | ICD-10-CM

## 2024-01-07 DIAGNOSIS — R3912 Poor urinary stream: Secondary | ICD-10-CM

## 2024-01-07 DIAGNOSIS — E1040 Type 1 diabetes mellitus with diabetic neuropathy, unspecified: Secondary | ICD-10-CM

## 2024-01-07 DIAGNOSIS — F419 Anxiety disorder, unspecified: Secondary | ICD-10-CM

## 2024-01-07 DIAGNOSIS — I10 Essential (primary) hypertension: Secondary | ICD-10-CM | POA: Diagnosis not present

## 2024-01-07 DIAGNOSIS — Z8616 Personal history of COVID-19: Secondary | ICD-10-CM

## 2024-01-07 DIAGNOSIS — R0789 Other chest pain: Secondary | ICD-10-CM

## 2024-01-07 DIAGNOSIS — Z125 Encounter for screening for malignant neoplasm of prostate: Secondary | ICD-10-CM

## 2024-01-07 DIAGNOSIS — E785 Hyperlipidemia, unspecified: Secondary | ICD-10-CM | POA: Diagnosis not present

## 2024-01-07 DIAGNOSIS — G629 Polyneuropathy, unspecified: Secondary | ICD-10-CM

## 2024-01-07 LAB — COMPREHENSIVE METABOLIC PANEL
ALT: 16 U/L (ref 0–53)
AST: 18 U/L (ref 0–37)
Albumin: 4.3 g/dL (ref 3.5–5.2)
Alkaline Phosphatase: 96 U/L (ref 39–117)
BUN: 10 mg/dL (ref 6–23)
CO2: 32 meq/L (ref 19–32)
Calcium: 9.3 mg/dL (ref 8.4–10.5)
Chloride: 100 meq/L (ref 96–112)
Creatinine, Ser: 0.81 mg/dL (ref 0.40–1.50)
GFR: 92.02 mL/min (ref >60.00)
Glucose, Bld: 106 mg/dL — ABNORMAL HIGH (ref 70–99)
Potassium: 5.1 meq/L (ref 3.5–5.1)
Sodium: 136 meq/L (ref 135–145)
Total Bilirubin: 0.4 mg/dL (ref 0.2–1.2)
Total Protein: 6.3 g/dL (ref 6.0–8.3)

## 2024-01-07 LAB — PSA, MEDICARE: PSA: 3.38 ng/mL (ref 0.10–4.00)

## 2024-01-07 LAB — LIPID PANEL
Cholesterol: 131 mg/dL (ref 0–200)
HDL: 59.5 mg/dL (ref >39.00)
LDL Cholesterol: 62 mg/dL (ref 0–99)
NonHDL: 71.37
Total CHOL/HDL Ratio: 2
Triglycerides: 49 mg/dL (ref 0.0–149.0)
VLDL: 9.8 mg/dL (ref 0.0–40.0)

## 2024-01-07 MED ORDER — GABAPENTIN 800 MG PO TABS: ORAL_TABLET | ORAL | 3 refills | Status: DC

## 2024-01-07 NOTE — Assessment & Plan Note (Signed)
Improved!  Continue tamsulosin 0.4 mg daily.

## 2024-01-07 NOTE — Assessment & Plan Note (Signed)
Improved.  Continue lisinopril 40 mg daily. CMP pending.

## 2024-01-07 NOTE — Assessment & Plan Note (Signed)
Stable.  Continue methocarbamol 500 mg as needed.
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
## 2024-01-07 NOTE — Assessment & Plan Note (Addendum)
Following with endocrinology.  Continue insulin pump. Continue gabapentin 800 mg 3 times daily.  Refills provided today.

## 2024-01-07 NOTE — Assessment & Plan Note (Signed)
Repeat lipid panel pending. Continue atorvastatin 40 mg daily, aspirin 81 mg daily

## 2024-01-07 NOTE — Assessment & Plan Note (Signed)
Improved and controlled.  Continue rizatriptan 5 mg as needed.

## 2024-01-07 NOTE — Assessment & Plan Note (Signed)
Long-hauler symptoms improved. Continue physical therapy.

## 2024-01-07 NOTE — Assessment & Plan Note (Signed)
Improved!  Following with GI through [facility]. Continue promethazine 12.5 mg daily as needed.

## 2024-01-07 NOTE — Assessment & Plan Note (Signed)
Improved. Continue Cymbalta 40 mg daily.

## 2024-01-07 NOTE — Assessment & Plan Note (Signed)
Improved!  Following with cardiology, office notes reviewed from January 2025.  Continue isosorbide mononitrate 15 mg daily. Blood pressure improved.

## 2024-01-07 NOTE — Patient Instructions (Signed)
Stop by the lab prior to leaving today. I will notify you of your results once received.   Complete your shingles vaccines at the pharmacy.  It was a pleasure to see you today!

## 2024-01-13 ENCOUNTER — Encounter: Payer: Self-pay | Admitting: Primary Care

## 2024-01-16 ENCOUNTER — Telehealth: Payer: Self-pay | Admitting: Primary Care

## 2024-01-16 NOTE — Telephone Encounter (Signed)
Received long-term disability paperwork from [employer].  I do not see where I have recently completed disability paperwork for him.  During our recent visit he mentioned he retired.  Can he clarify what this paperwork is for?

## 2024-01-19 NOTE — Telephone Encounter (Signed)
Called and spoke with patient, he states he is not retired however he is unable to work due to the effects of Covid. Patient states he has been on disability (thinks Nilda Calamity filled out previous paperwork). This long term disability ppw will only cover 6 more months per patient due to benefits stopping because of age.

## 2024-01-20 NOTE — Telephone Encounter (Signed)
Recommend he have the original provider complete these forms again since she has been the one doing so.

## 2024-01-20 NOTE — Telephone Encounter (Signed)
Called and advised patient, patient requested we fax forms to Nilda Calamity, PA.  Faxed as requested.

## 2024-03-05 MED ORDER — AMLODIPINE BESYLATE 2.5 MG PO TABS: 2.5000 mg | ORAL_TABLET | Freq: Every day | ORAL | 11 refills | Status: DC

## 2024-03-19 ENCOUNTER — Other Ambulatory Visit: Payer: Self-pay | Admitting: Primary Care

## 2024-03-19 DIAGNOSIS — M150 Primary generalized (osteo)arthritis: Secondary | ICD-10-CM

## 2024-03-28 ENCOUNTER — Other Ambulatory Visit: Payer: Self-pay | Admitting: Primary Care

## 2024-03-28 DIAGNOSIS — R351 Nocturia: Secondary | ICD-10-CM

## 2024-03-28 DIAGNOSIS — R3912 Poor urinary stream: Secondary | ICD-10-CM

## 2024-04-27 ENCOUNTER — Other Ambulatory Visit: Payer: Self-pay

## 2024-04-27 MED ORDER — ISOSORBIDE MONONITRATE ER 30 MG PO TB24: 15.0000 mg | ORAL_TABLET | Freq: Every day | ORAL | 3 refills | Status: DC

## 2024-05-14 ENCOUNTER — Telehealth: Payer: Self-pay | Admitting: Nurse Practitioner

## 2024-05-14 NOTE — Telephone Encounter (Signed)
 Spoke with patient.  Denies CP at this present time, but has been having dull, indigestion type chest pain for the past couple of days, states that he has had some nausea and dizziness, but no SOB.  States Imdur  15 mg (0.5 tab) doesn't seem to be working as well as it had in the past for reducing chest pain and would like to discuss the possibility of increasing dose.  Scheduled patient an appointment for Monday, 6/2 @ 11:20 am.  Advised pt that if CP, dizziness, nausea or SOB increased prior to coming to appointment, to go to Emergency Department.  Pt verbalized understanding.

## 2024-05-14 NOTE — Telephone Encounter (Signed)
 Pt c/o of Chest Pain: STAT if active CP, including tightness, pressure, jaw pain, radiating pain to shoulder/upper arm/back, CP unrelieved by Nitro. Symptoms reported of SOB, nausea, vomiting, sweating.  1. Are you having CP right now? no    2. Are you experiencing any other symptoms (ex. SOB, nausea, vomiting, sweating)? Dizziness, nausea   3. Is your CP continuous or coming and going? Coming and going    4. Have you taken Nitroglycerin ? no   5. How long have you been experiencing CP? The last couple weeks    6. If NO CP at time of call then end call with telling Pt to call back or call 911 if Chest pain returns prior to return call from triage team.

## 2024-05-17 ENCOUNTER — Ambulatory Visit: Attending: Cardiology | Admitting: Cardiology

## 2024-05-17 ENCOUNTER — Encounter: Payer: Self-pay | Admitting: Cardiology

## 2024-05-17 VITALS — BP 160/68 | HR 66 | Ht 70.5 in | Wt 162.1 lb

## 2024-05-17 DIAGNOSIS — E7800 Pure hypercholesterolemia, unspecified: Secondary | ICD-10-CM | POA: Diagnosis not present

## 2024-05-17 DIAGNOSIS — I10 Essential (primary) hypertension: Secondary | ICD-10-CM | POA: Diagnosis not present

## 2024-05-17 DIAGNOSIS — I2510 Atherosclerotic heart disease of native coronary artery without angina pectoris: Secondary | ICD-10-CM | POA: Diagnosis not present

## 2024-05-17 MED ORDER — AMLODIPINE BESYLATE 5 MG PO TABS: 5.0000 mg | ORAL_TABLET | Freq: Every day | ORAL | 3 refills | Status: DC

## 2024-05-17 MED ORDER — METOPROLOL SUCCINATE ER 50 MG PO TB24: 50.0000 mg | ORAL_TABLET | Freq: Every day | ORAL | 3 refills | Status: DC

## 2024-05-17 NOTE — Patient Instructions (Signed)
 Medication Instructions:   Your physician recommends the following medication changes.  STOP TAKING: Isosorbide   START TAKING: Toprol  XL 50 mg by mouth daily  INCREASE:  Increase Norvasc  to 5 mg     *If you need a refill on your cardiac medications before your next appointment, please call your pharmacy*  Lab Work: No labs ordered today  If you have labs (blood work) drawn today and your tests are completely normal, you will receive your results only by: MyChart Message (if you have MyChart) OR A paper copy in the mail If you have any lab test that is abnormal or we need to change your treatment, we will call you to review the results.  Testing/Procedures:  No test ordered today   Follow-Up: At [facility], you and your health needs are our priority.  As part of our continuing mission to provide you with exceptional heart care, our providers are all part of one team.  This team includes your primary Cardiologist (physician) and Advanced Practice Providers or APPs (Physician Assistants and Nurse Practitioners) who all work together to provide you with the care you need, when you need it.  Your next appointment:    6- 8 week(s)  Provider:    Constancia Delton, MD    We recommend signing up for the patient portal called "MyChart".  Sign up information is provided on this After Visit Summary.  MyChart is used to connect with patients for Virtual Visits (Telemedicine).  Patients are able to view lab/test results, encounter notes, upcoming appointments, etc.  Non-urgent messages can be sent to your provider as well.   To learn more about what you can do with MyChart, go to [email or web address].
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
## 2024-05-27 ENCOUNTER — Other Ambulatory Visit: Payer: Self-pay

## 2024-05-27 DIAGNOSIS — I10 Essential (primary) hypertension: Secondary | ICD-10-CM

## 2024-05-27 MED ORDER — LISINOPRIL 40 MG PO TABS: 40.0000 mg | ORAL_TABLET | Freq: Every day | ORAL | 3 refills | Status: AC

## 2024-06-15 ENCOUNTER — Ambulatory Visit: Admitting: Cardiology

## 2024-06-17 ENCOUNTER — Encounter: Payer: Self-pay | Admitting: Cardiology

## 2024-06-17 ENCOUNTER — Ambulatory Visit: Attending: Cardiology | Admitting: Cardiology

## 2024-06-17 VITALS — BP 137/77 | HR 55 | Ht 71.0 in | Wt 156.4 lb

## 2024-06-17 DIAGNOSIS — I2510 Atherosclerotic heart disease of native coronary artery without angina pectoris: Secondary | ICD-10-CM | POA: Insufficient documentation

## 2024-06-17 DIAGNOSIS — I10 Essential (primary) hypertension: Secondary | ICD-10-CM | POA: Diagnosis present

## 2024-06-17 DIAGNOSIS — E7800 Pure hypercholesterolemia, unspecified: Secondary | ICD-10-CM | POA: Insufficient documentation

## 2024-06-17 NOTE — Patient Instructions (Signed)
 Medication Instructions:   Your physician recommends that you continue on your current medications as directed. Please refer to the Current Medication list given to you today.   *If you need a refill on your cardiac medications before your next appointment, please call your pharmacy*  Lab Work:  No labs ordered today   If you have labs (blood work) drawn today and your tests are completely normal, you will receive your results only by: MyChart Message (if you have MyChart) OR A paper copy in the mail If you have any lab test that is abnormal or we need to change your treatment, we will call you to review the results.  Testing/Procedures:  No test ordered today   Follow-Up: At [facility], you and your health needs are our priority.  As part of our continuing mission to provide you with exceptional heart care, our providers are all part of one team.  This team includes your primary Cardiologist (physician) and Advanced Practice Providers or APPs (Physician Assistants and Nurse Practitioners) who all work together to provide you with the care you need, when you need it.  Your next appointment:   3 month(s)  Provider:   You may see Constancia Delton, MD or one of the following Advanced Practice Providers on your designated Care Team:   Laneta Pintos, NP Gildardo Labrador, PA-C Varney Gentleman, PA-C Cadence Francis, PA-C Ronald Cockayne, NP Morey Ar, NP    We recommend signing up for the patient portal called MyChart.  Sign up information is provided on this After Visit Summary.  MyChart is used to connect with patients for Virtual Visits (Telemedicine).  Patients are able to view lab/test results, encounter notes, upcoming appointments, etc.  Non-urgent messages can be sent to your provider as well.   To learn more about what you can do with MyChart, go to [email or web address].
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
## 2024-06-23 ENCOUNTER — Ambulatory Visit (INDEPENDENT_AMBULATORY_CARE_PROVIDER_SITE_OTHER): Admitting: Primary Care

## 2024-06-23 ENCOUNTER — Encounter: Payer: Self-pay | Admitting: Primary Care

## 2024-06-23 VITALS — BP 122/86 | HR 63 | Temp 97.2°F | Ht 71.0 in | Wt 168.0 lb

## 2024-06-23 DIAGNOSIS — K089 Disorder of teeth and supporting structures, unspecified: Secondary | ICD-10-CM | POA: Diagnosis not present

## 2024-06-23 DIAGNOSIS — G8929 Other chronic pain: Secondary | ICD-10-CM | POA: Diagnosis not present

## 2024-06-23 MED ORDER — AMOXICILLIN 875 MG PO TABS: 875.0000 mg | ORAL_TABLET | Freq: Two times a day (BID) | ORAL | 0 refills | Status: AC
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
## 2024-06-23 NOTE — Assessment & Plan Note (Signed)
 Likely due to underlying infection. Exam overall reassuring today.  Start Amoxil  875 twice daily x 7 days. Follow-up with oral surgeon as scheduled.

## 2024-06-23 NOTE — Patient Instructions (Signed)
 Start amoxicillin  875 mg tablets for the infection.  Take 1 tablet mouth twice daily for 7 days.  It was a pleasure to see you today!

## 2024-06-30 ENCOUNTER — Ambulatory Visit: Admitting: Cardiology

## 2024-07-08 ENCOUNTER — Ambulatory Visit

## 2024-07-08 VITALS — BP 127/77 | Ht 71.0 in | Wt 162.0 lb

## 2024-07-08 DIAGNOSIS — E1040 Type 1 diabetes mellitus with diabetic neuropathy, unspecified: Secondary | ICD-10-CM

## 2024-07-08 DIAGNOSIS — Z0000 Encounter for general adult medical examination without abnormal findings: Secondary | ICD-10-CM | POA: Diagnosis not present

## 2024-07-08 NOTE — Patient Instructions (Signed)
 Martin Deleon , Thank you for taking time out of your busy schedule to complete your Annual Wellness Visit with me. I enjoyed our conversation and look forward to speaking with you again next year. I, as well as your care team,  appreciate your ongoing commitment to your health goals. Please review the following plan we discussed and let me know if I can assist you in the future. Your Game plan/ To Do List    Referrals: If you haven't heard from the office you've been referred to, please reach out to them at the phone provided.  none Follow up Visits: Next Medicare AWV with our clinical staff: 07/12/2025   Have you seen your provider in the last 6 months (3 months if uncontrolled diabetes)? No Next Office Visit with your provider:   Clinician Recommendations:  Aim for 30 minutes of exercise or brisk walking, 6-8 glasses of water, and 5 servings of fruits and vegetables each day.       This is a list of the screening recommended for you and due dates:  Health Maintenance  Topic Date Due   Complete foot exam   Never done   Eye exam for diabetics  Never done   Yearly kidney health urinalysis for diabetes  Never done   Zoster (Shingles) Vaccine (1 of 2) Never done   Hemoglobin A1C  01/28/2024   COVID-19 Vaccine (5 - 2024-25 season) 07/09/2024*   Flu Shot  07/16/2024   Yearly kidney function blood test for diabetes  01/06/2025   Medicare Annual Wellness Visit  07/08/2025   Colon Cancer Screening  05/12/2028   DTaP/Tdap/Td vaccine (2 - Td or Tdap) 01/08/2029   Pneumococcal Vaccine for age over 67  Completed   Hepatitis C Screening  Completed   Hepatitis B Vaccine  Aged Out   HPV Vaccine  Aged Out   Meningitis B Vaccine  Aged Out  *Topic was postponed. The date shown is not the original due date.    Advanced directives: (Declined) Advance directive discussed with you today. Even though you declined this today, please call our office should you change your mind, and we can give you the  proper paperwork for you to fill out. Advance Care Planning is important because it:  [x]  Makes sure you receive the medical care that is consistent with your values, goals, and preferences  [x]  It provides guidance to your family and loved ones and reduces their decisional burden about whether or not they are making the right decisions based on your wishes.  Follow the link provided in your after visit summary or read over the paperwork we have mailed to you to help you started getting your Advance Directives in place. If you need assistance in completing these, please reach out to us  so that we can help you!  See attachments for Preventive Care and Fall Prevention Tips.
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
## 2024-07-19 NOTE — Progress Notes (Unsigned)
 Cardiology Office Note:  .   Date:  07/22/2024  ID:  Martin Deleon, DOB 1956/07/06, MRN [id number] PCP: Gretta Comer POUR, NP  [facility] Providers Cardiologist:  Redell Cave, MD {  History of Present Illness: .   Martin Deleon is a 68 y.o. male with history of nonobstructive CAD by CCTA 08/2023, type 1 diabetes followed by endocrinology, hypertension, hyperlipidemia, previous smoker.     Nonobstructive CAD Coronary CTA 08/2023 with mild disease, CAC 406.  Echocardiogram preserved biventricular function.  Mild MR. Had headaches on Imdur .  Toprol -XL beneficial  Social history  Previous smoker of 19+ years Reports father who had an MI, brother also has some arrhythmia. Active, playing golf. Occasionally smokes marijuana for pain    Patient with history of nonobstructive CAD on coronary CT.  Recently seen 06/2024 with complaints of nonexertional chest pain felt possibly related to recent viral illness, but had found improvement in symptoms after starting Toprol -XL.  Today patient presents for follow-up to discuss his chest pain.  He reports complete resolution of chest pain.  Previously had reported burning-like sensation coming from his upper abdomen.  He feels very confident that this was related to his viral illness.  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  This week he played golf for the first time in years and walked over 2 miles, played 18 holes and had no exertional chest pain, shortness of breath.  Has no acute complaints today and feeling great.  ROS: Denies: Chest pain, shortness of breath, orthopnea, peripheral edema, palpitations, decreased exercise intolerance, fatigue, lightheadedness.   Studies Reviewed: .         Risk Assessment/Calculations:             Physical Exam:   VS:  BP 127/74 (BP Location: Left Arm, Patient Position: Sitting, Cuff Size: Normal)   Pulse (!) 55    Ht 5' 11 (1.803 m)   Wt 165 lb 12.8 oz (75.2 kg)   SpO2 96%   BMI 23.12 kg/m    Wt Readings from Last 3 Encounters:  07/22/24 165 lb 12.8 oz (75.2 kg)  07/08/24 162 lb (73.5 kg)  06/23/24 168 lb (76.2 kg)    GEN: Well nourished, well developed in no acute distress NECK: No JVD; No carotid bruits CARDIAC: RRR, no murmurs, rubs, gallops RESPIRATORY:  Clear to auscultation without rales, wheezing or rhonchi  ABDOMEN: Soft, non-tender, non-distended EXTREMITIES:  No edema; No deformity   ASSESSMENT AND PLAN: .    Chest pain Reported burning pain in his upper abdomen in the setting of recent viral illness.  Has had complete resolution of symptoms.  Presentation very atypical for any cardiac related issues.  Nonobstructive CAD Hyperlipidemia Mild disease based off of coronary CTA 08/2023.  CAC 406.  Echocardiogram with preserved biventricular function and no significant valvular disease.  Overall stable disease.  Above presentation not consistent with obstructive CAD. Continue with aspirin , amlodipine  5 mg, atorvastatin  40 mg, Toprol  XL 50 mg (reported improvement on this) LDL 6 months ago 62. Did not tolerate Imdur  due to headaches  Mild MR Noted on echocardiogram 08/2023.  Continue to follow clinically.  Can consider repeat echocardiogram in 2 to 3 years.  Demonstrates excellent functional capacity currently.  Type 1 diabetes Followed by endocrinology.  He reports A1c is 7.4%.  Hypertension Very well-controlled 127/74. Continue amlodipine  5 mg, lisinopril  40 mg, Toprol -XL 50 mg.  Dispo: 1 year follow-up with Dr. Budd  Signed, Thom LITTIE Sluder, PA-C

## 2024-07-22 ENCOUNTER — Ambulatory Visit: Attending: Nurse Practitioner | Admitting: Cardiology

## 2024-07-22 VITALS — BP 127/74 | HR 55 | Ht 71.0 in | Wt 165.8 lb

## 2024-07-22 DIAGNOSIS — E7800 Pure hypercholesterolemia, unspecified: Secondary | ICD-10-CM | POA: Diagnosis present

## 2024-07-22 DIAGNOSIS — I10 Essential (primary) hypertension: Secondary | ICD-10-CM | POA: Insufficient documentation

## 2024-07-22 DIAGNOSIS — E1040 Type 1 diabetes mellitus with diabetic neuropathy, unspecified: Secondary | ICD-10-CM | POA: Diagnosis present

## 2024-07-22 DIAGNOSIS — I2510 Atherosclerotic heart disease of native coronary artery without angina pectoris: Secondary | ICD-10-CM | POA: Insufficient documentation

## 2024-07-22 DIAGNOSIS — R072 Precordial pain: Secondary | ICD-10-CM | POA: Insufficient documentation

## 2024-07-22 NOTE — Patient Instructions (Signed)
 Medication Instructions:   Your physician recommends that you continue on your current medications as directed. Please refer to the Current Medication list given to you today.   *If you need a refill on your cardiac medications before your next appointment, please call your pharmacy*  Lab Work: NONE If you have labs (blood work) drawn today and your tests are completely normal, you will receive your results only by: MyChart Message (if you have MyChart) OR A paper copy in the mail If you have any lab test that is abnormal or we need to change your treatment, we will call you to review the results.  Testing/Procedures: NONE  Follow-Up: At [facility], you and your health needs are our priority.  As part of our continuing mission to provide you with exceptional heart care, our providers are all part of one team.  This team includes your primary Cardiologist (physician) and Advanced Practice Providers or APPs (Physician Assistants and Nurse Practitioners) who all work together to provide you with the care you need, when you need it.  Your next appointment:   1 year(s)  Provider:   You may see Redell Cave, MD or one of the following Advanced Practice Providers on your designated Care Team:   Lonni Meager, NP Lesley Maffucci, PA-C Bernardino Bring, PA-C Cadence Oxford, PA-C Tylene Lunch, NP Barnie Hila, NP

## 2024-07-28 ENCOUNTER — Other Ambulatory Visit: Payer: Self-pay | Admitting: Primary Care

## 2024-07-28 ENCOUNTER — Other Ambulatory Visit: Payer: Self-pay

## 2024-07-28 DIAGNOSIS — R112 Nausea with vomiting, unspecified: Secondary | ICD-10-CM

## 2024-07-28 MED ORDER — AMLODIPINE BESYLATE 5 MG PO TABS: 5.0000 mg | ORAL_TABLET | Freq: Every day | ORAL | 3 refills | Status: DC

## 2024-08-02 ENCOUNTER — Other Ambulatory Visit: Payer: Self-pay | Admitting: Primary Care

## 2024-08-02 DIAGNOSIS — M150 Primary generalized (osteo)arthritis: Secondary | ICD-10-CM

## 2024-08-03 ENCOUNTER — Telehealth: Payer: Self-pay

## 2024-08-03 NOTE — Telephone Encounter (Signed)
 Additional information has been requested from the patient's insurance in order to proceed with the prior authorization request for Promethazine . Requested information has been sent, or form has been filled out and faxed back to [phone]

## 2024-08-05 ENCOUNTER — Other Ambulatory Visit (HOSPITAL_COMMUNITY): Payer: Self-pay

## 2024-08-05 NOTE — Telephone Encounter (Signed)
 Pharmacy Patient Advocate Encounter  Received notification from [employer] that Prior Authorization for  PROMETHAZINE  12.5MG  has been APPROVED from 07/04/24 to 08/03/25. Ran test claim, Copay is $0.48. This test claim was processed through [facility]- copay amounts may vary at other pharmacies due to pharmacy/plan contracts, or as the patient moves through the different stages of their insurance plan.   PA #/Case ID/Reference #: [id number]

## 2024-08-31 ENCOUNTER — Other Ambulatory Visit: Payer: Self-pay | Admitting: Primary Care

## 2024-08-31 DIAGNOSIS — R112 Nausea with vomiting, unspecified: Secondary | ICD-10-CM

## 2024-09-05 ENCOUNTER — Other Ambulatory Visit: Payer: Self-pay | Admitting: Cardiology

## 2024-10-04 ENCOUNTER — Other Ambulatory Visit: Payer: Self-pay

## 2024-10-04 DIAGNOSIS — R351 Nocturia: Secondary | ICD-10-CM

## 2024-10-04 DIAGNOSIS — R3912 Poor urinary stream: Secondary | ICD-10-CM

## 2024-10-04 DIAGNOSIS — G629 Polyneuropathy, unspecified: Secondary | ICD-10-CM

## 2024-10-04 MED ORDER — TAMSULOSIN HCL 0.4 MG PO CAPS: 0.4000 mg | ORAL_CAPSULE | Freq: Every day | ORAL | 0 refills | Status: DC

## 2024-10-04 NOTE — Telephone Encounter (Signed)
 Patient is due for CPE/follow up in late January 2026, this will be required prior to any further refills.  Please schedule, thank you!

## 2024-10-05 NOTE — Telephone Encounter (Signed)
 Lvm to call office

## 2024-10-08 ENCOUNTER — Other Ambulatory Visit: Payer: Self-pay | Admitting: Primary Care

## 2024-10-08 DIAGNOSIS — M150 Primary generalized (osteo)arthritis: Secondary | ICD-10-CM

## 2024-10-10 NOTE — Telephone Encounter (Signed)
 Patient is due for follow up in late January 2026, this will be required prior to any further refills.  Please schedule, thank you!

## 2024-10-12 NOTE — Telephone Encounter (Signed)
Lvm and sent MyChart

## 2024-10-15 NOTE — Telephone Encounter (Signed)
Lvm to schedule for follow-up

## 2024-10-18 ENCOUNTER — Other Ambulatory Visit: Payer: Self-pay | Admitting: Primary Care

## 2024-10-18 DIAGNOSIS — G629 Polyneuropathy, unspecified: Secondary | ICD-10-CM

## 2024-10-19 NOTE — Telephone Encounter (Signed)
 Pt has been scheduled.

## 2024-10-19 NOTE — Telephone Encounter (Signed)
 Patient is due for CPE/follow up in late January 2026, this will be required prior to any further refills.  Please schedule, thank you!

## 2024-10-25 NOTE — Addendum Note (Signed)
 Addended by: ALBINO SHAVER C on: 10/25/2024 01:33 PM   Modules accepted: Orders

## 2024-10-27 ENCOUNTER — Ambulatory Visit: Admitting: Primary Care

## 2024-11-15 ENCOUNTER — Other Ambulatory Visit: Payer: Self-pay | Admitting: Cardiology

## 2024-11-15 DIAGNOSIS — I10 Essential (primary) hypertension: Secondary | ICD-10-CM

## 2024-11-15 DIAGNOSIS — E7800 Pure hypercholesterolemia, unspecified: Secondary | ICD-10-CM

## 2024-11-19 ENCOUNTER — Other Ambulatory Visit: Payer: Self-pay | Admitting: Cardiology

## 2024-11-23 ENCOUNTER — Other Ambulatory Visit: Payer: Self-pay | Admitting: Cardiology

## 2024-11-24 MED ORDER — METOPROLOL SUCCINATE ER 50 MG PO TB24: 50.0000 mg | ORAL_TABLET | Freq: Every day | ORAL | 2 refills | Status: AC

## 2025-01-01 ENCOUNTER — Other Ambulatory Visit: Payer: Self-pay | Admitting: Primary Care

## 2025-01-01 DIAGNOSIS — M150 Primary generalized (osteo)arthritis: Secondary | ICD-10-CM

## 2025-01-11 ENCOUNTER — Ambulatory Visit: Admitting: Primary Care

## 2025-01-12 ENCOUNTER — Encounter: Payer: Self-pay | Admitting: Primary Care

## 2025-01-12 ENCOUNTER — Ambulatory Visit: Admitting: Primary Care

## 2025-01-12 VITALS — BP 132/60 | HR 49 | Temp 98.1°F | Ht 71.0 in | Wt 164.8 lb

## 2025-01-12 DIAGNOSIS — M542 Cervicalgia: Secondary | ICD-10-CM | POA: Diagnosis not present

## 2025-01-12 DIAGNOSIS — G43009 Migraine without aura, not intractable, without status migrainosus: Secondary | ICD-10-CM

## 2025-01-12 DIAGNOSIS — F32A Depression, unspecified: Secondary | ICD-10-CM | POA: Diagnosis not present

## 2025-01-12 DIAGNOSIS — R351 Nocturia: Secondary | ICD-10-CM | POA: Diagnosis not present

## 2025-01-12 DIAGNOSIS — G8929 Other chronic pain: Secondary | ICD-10-CM

## 2025-01-12 DIAGNOSIS — F419 Anxiety disorder, unspecified: Secondary | ICD-10-CM

## 2025-01-12 DIAGNOSIS — E785 Hyperlipidemia, unspecified: Secondary | ICD-10-CM

## 2025-01-12 DIAGNOSIS — R3912 Poor urinary stream: Secondary | ICD-10-CM | POA: Diagnosis not present

## 2025-01-12 DIAGNOSIS — G629 Polyneuropathy, unspecified: Secondary | ICD-10-CM

## 2025-01-12 DIAGNOSIS — R112 Nausea with vomiting, unspecified: Secondary | ICD-10-CM

## 2025-01-12 DIAGNOSIS — E1040 Type 1 diabetes mellitus with diabetic neuropathy, unspecified: Secondary | ICD-10-CM | POA: Diagnosis not present

## 2025-01-12 DIAGNOSIS — Z125 Encounter for screening for malignant neoplasm of prostate: Secondary | ICD-10-CM | POA: Diagnosis not present

## 2025-01-12 DIAGNOSIS — I10 Essential (primary) hypertension: Secondary | ICD-10-CM | POA: Diagnosis not present

## 2025-01-12 LAB — LIPID PANEL
Cholesterol: 131 mg/dL (ref 28–200)
HDL: 51.6 mg/dL
LDL Cholesterol: 69 mg/dL (ref 10–99)
NonHDL: 79.88
Total CHOL/HDL Ratio: 3
Triglycerides: 52 mg/dL (ref 10.0–149.0)
VLDL: 10.4 mg/dL (ref 0.0–40.0)

## 2025-01-12 LAB — COMPREHENSIVE METABOLIC PANEL WITH GFR
ALT: 20 U/L (ref 3–53)
AST: 26 U/L (ref 5–37)
Albumin: 4.3 g/dL (ref 3.5–5.2)
Alkaline Phosphatase: 71 U/L (ref 39–117)
BUN: 11 mg/dL (ref 6–23)
CO2: 29 meq/L (ref 19–32)
Calcium: 9 mg/dL (ref 8.4–10.5)
Chloride: 98 meq/L (ref 96–112)
Creatinine, Ser: 0.77 mg/dL (ref 0.40–1.50)
GFR: 92.77 mL/min
Glucose, Bld: 139 mg/dL — ABNORMAL HIGH (ref 70–99)
Potassium: 4.7 meq/L (ref 3.5–5.1)
Sodium: 132 meq/L — ABNORMAL LOW (ref 135–145)
Total Bilirubin: 0.6 mg/dL (ref 0.2–1.2)
Total Protein: 6.5 g/dL (ref 6.0–8.3)

## 2025-01-12 LAB — CBC
HCT: 40.6 % (ref 39.0–52.0)
Hemoglobin: 13.5 g/dL (ref 13.0–17.0)
MCHC: 33.3 g/dL (ref 30.0–36.0)
MCV: 92.8 fl (ref 78.0–100.0)
Platelets: 274 10*3/uL (ref 150.0–400.0)
RBC: 4.38 Mil/uL (ref 4.22–5.81)
RDW: 13.5 % (ref 11.5–15.5)
WBC: 7.9 10*3/uL (ref 4.0–10.5)

## 2025-01-12 MED ORDER — DULOXETINE HCL 20 MG PO CPEP: 20.0000 mg | ORAL_CAPSULE | Freq: Every day | ORAL | 3 refills | Status: AC

## 2025-01-12 MED ORDER — GABAPENTIN 800 MG PO TABS: ORAL_TABLET | ORAL | 3 refills | Status: AC

## 2025-01-12 MED ORDER — TAMSULOSIN HCL 0.4 MG PO CAPS: 0.4000 mg | ORAL_CAPSULE | Freq: Every day | ORAL | 3 refills | Status: AC

## 2025-01-12 MED ORDER — RIZATRIPTAN BENZOATE 5 MG PO TABS: ORAL_TABLET | ORAL | 0 refills | Status: AC

## 2025-01-12 NOTE — Assessment & Plan Note (Signed)
 Controlled.  Continue amlodipine  5 mg daily, lisinopril  40 mg daily, metoprolol  succinate 50 mg daily. CMP pending

## 2025-01-12 NOTE — Assessment & Plan Note (Addendum)
 Improved!  Following with GI. Continue promethazine  12.5 mg daily.

## 2025-01-12 NOTE — Assessment & Plan Note (Signed)
 Controlled, no concerns today  Will renew rizatriptan  5 mg prescription today. Continue rizatriptan  5 mg as needed, promethazine  12.5 mg daily.

## 2025-01-12 NOTE — Assessment & Plan Note (Signed)
Controlled.  Continue duloxetine 40 mg daily.

## 2025-01-12 NOTE — Assessment & Plan Note (Signed)
 Improved.  Continue tamsulosin 0.4 mg daily.

## 2025-01-12 NOTE — Patient Instructions (Signed)
 Stop by the lab prior to leaving today. I will notify you of your results once received.   It was a pleasure to see you today!

## 2025-01-12 NOTE — Assessment & Plan Note (Signed)
Stable. Continue gabapentin 800 mg 3 times daily.
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
## 2025-01-12 NOTE — Assessment & Plan Note (Signed)
 Repeat lipid panel pending. Continue atorvastatin 40 mg daily.

## 2025-01-12 NOTE — Assessment & Plan Note (Signed)
 Improving!  Following with endocrinology, office notes and labs reviewed from January 2026. Continue insulin  pump as prescribed

## 2025-01-13 ENCOUNTER — Ambulatory Visit: Payer: Self-pay | Admitting: Primary Care

## 2025-01-13 DIAGNOSIS — R972 Elevated prostate specific antigen [PSA]: Secondary | ICD-10-CM

## 2025-01-13 DIAGNOSIS — Z125 Encounter for screening for malignant neoplasm of prostate: Secondary | ICD-10-CM

## 2025-01-13 LAB — PSA, MEDICARE: PSA: 4.76 ng/mL — ABNORMAL HIGH (ref 0.10–4.00)

## 2025-01-20 ENCOUNTER — Other Ambulatory Visit: Payer: Self-pay

## 2025-01-20 MED ORDER — AMLODIPINE BESYLATE 5 MG PO TABS: 5.0000 mg | ORAL_TABLET | Freq: Every day | ORAL | 3 refills | Status: AC

## 2025-07-12 ENCOUNTER — Ambulatory Visit
